# Patient Record
Sex: Female | Born: 1945
Health system: Southern US, Community
[De-identification: ages and names within clinical notes are randomized; demographics above are authoritative.]

## PROBLEM LIST (undated history)

## (undated) DIAGNOSIS — N879 Dysplasia of cervix uteri, unspecified: Secondary | ICD-10-CM

## (undated) DIAGNOSIS — C801 Malignant (primary) neoplasm, unspecified: Secondary | ICD-10-CM

## (undated) DIAGNOSIS — F17200 Nicotine dependence, unspecified, uncomplicated: Secondary | ICD-10-CM

## (undated) DIAGNOSIS — E669 Obesity, unspecified: Secondary | ICD-10-CM

## (undated) DIAGNOSIS — E785 Hyperlipidemia, unspecified: Secondary | ICD-10-CM

## (undated) DIAGNOSIS — I2510 Atherosclerotic heart disease of native coronary artery without angina pectoris: Secondary | ICD-10-CM

## (undated) DIAGNOSIS — H269 Unspecified cataract: Secondary | ICD-10-CM

## (undated) DIAGNOSIS — J449 Chronic obstructive pulmonary disease, unspecified: Secondary | ICD-10-CM

## (undated) DIAGNOSIS — I219 Acute myocardial infarction, unspecified: Secondary | ICD-10-CM

## (undated) DIAGNOSIS — N893 Dysplasia of vagina, unspecified: Secondary | ICD-10-CM

## (undated) DIAGNOSIS — R0600 Dyspnea, unspecified: Secondary | ICD-10-CM

## (undated) DIAGNOSIS — I10 Essential (primary) hypertension: Secondary | ICD-10-CM

## (undated) HISTORY — PX: ABDOMINAL HYSTERECTOMY: SHX81

## (undated) HISTORY — PX: CATARACT EXTRACTION: SUR2

## (undated) HISTORY — DX: Hyperlipidemia, unspecified: E78.5

## (undated) HISTORY — DX: Essential (primary) hypertension: I10

## (undated) HISTORY — PX: CARDIAC CATHETERIZATION: SHX172

## (undated) HISTORY — DX: Atherosclerotic heart disease of native coronary artery without angina pectoris: I25.10

## (undated) HISTORY — DX: Unspecified cataract: H26.9

## (undated) HISTORY — DX: Nicotine dependence, unspecified, uncomplicated: F17.200

## (undated) HISTORY — DX: Obesity, unspecified: E66.9

## (undated) HISTORY — PX: OOPHORECTOMY: SHX86

## (undated) HISTORY — DX: Dysplasia of cervix uteri, unspecified: N87.9

## (undated) HISTORY — PX: OTHER SURGICAL HISTORY: SHX169

## (undated) HISTORY — DX: Dysplasia of vagina, unspecified: N89.3

## (undated) HISTORY — PX: COLPOSCOPY: SHX161

## (undated) HISTORY — DX: Acute myocardial infarction, unspecified: I21.9

---

## 1997-08-14 ENCOUNTER — Emergency Department (HOSPITAL_COMMUNITY): Admission: EM | Admit: 1997-08-14 | Discharge: 1997-08-14 | Payer: Self-pay | Admitting: Emergency Medicine

## 1997-12-30 ENCOUNTER — Other Ambulatory Visit: Admission: RE | Admit: 1997-12-30 | Discharge: 1997-12-30 | Payer: Self-pay | Admitting: Obstetrics and Gynecology

## 1999-01-10 ENCOUNTER — Encounter: Admission: RE | Admit: 1999-01-10 | Discharge: 1999-01-10 | Payer: Self-pay | Admitting: Family Medicine

## 1999-01-10 ENCOUNTER — Encounter: Payer: Self-pay | Admitting: Family Medicine

## 1999-06-24 ENCOUNTER — Other Ambulatory Visit: Admission: RE | Admit: 1999-06-24 | Discharge: 1999-06-24 | Payer: Self-pay | Admitting: Obstetrics and Gynecology

## 1999-07-25 ENCOUNTER — Other Ambulatory Visit: Admission: RE | Admit: 1999-07-25 | Discharge: 1999-07-25 | Payer: Self-pay | Admitting: Obstetrics and Gynecology

## 1999-10-14 ENCOUNTER — Encounter: Admission: RE | Admit: 1999-10-14 | Discharge: 1999-10-14 | Payer: Self-pay | Admitting: Family Medicine

## 1999-10-14 ENCOUNTER — Encounter: Payer: Self-pay | Admitting: Family Medicine

## 1999-10-28 ENCOUNTER — Other Ambulatory Visit: Admission: RE | Admit: 1999-10-28 | Discharge: 1999-10-28 | Payer: Self-pay | Admitting: Obstetrics and Gynecology

## 2000-10-19 ENCOUNTER — Encounter: Payer: Self-pay | Admitting: Family Medicine

## 2000-10-19 ENCOUNTER — Encounter: Admission: RE | Admit: 2000-10-19 | Discharge: 2000-10-19 | Payer: Self-pay | Admitting: Family Medicine

## 2000-11-09 ENCOUNTER — Encounter: Payer: Self-pay | Admitting: Family Medicine

## 2000-11-09 ENCOUNTER — Encounter: Admission: RE | Admit: 2000-11-09 | Discharge: 2000-11-09 | Payer: Self-pay | Admitting: Family Medicine

## 2001-01-09 ENCOUNTER — Encounter: Admission: RE | Admit: 2001-01-09 | Discharge: 2001-01-09 | Payer: Self-pay | Admitting: Gastroenterology

## 2001-01-09 ENCOUNTER — Encounter: Payer: Self-pay | Admitting: Gastroenterology

## 2001-01-21 ENCOUNTER — Other Ambulatory Visit: Admission: RE | Admit: 2001-01-21 | Discharge: 2001-01-21 | Payer: Self-pay | Admitting: Obstetrics and Gynecology

## 2001-08-08 ENCOUNTER — Other Ambulatory Visit: Admission: RE | Admit: 2001-08-08 | Discharge: 2001-08-08 | Payer: Self-pay | Admitting: Obstetrics and Gynecology

## 2001-10-24 ENCOUNTER — Encounter: Admission: RE | Admit: 2001-10-24 | Discharge: 2001-10-24 | Payer: Self-pay | Admitting: Family Medicine

## 2001-10-24 ENCOUNTER — Encounter: Payer: Self-pay | Admitting: Family Medicine

## 2002-08-11 ENCOUNTER — Other Ambulatory Visit: Admission: RE | Admit: 2002-08-11 | Discharge: 2002-08-11 | Payer: Self-pay | Admitting: Obstetrics and Gynecology

## 2002-10-31 ENCOUNTER — Encounter: Payer: Self-pay | Admitting: Family Medicine

## 2002-10-31 ENCOUNTER — Encounter: Admission: RE | Admit: 2002-10-31 | Discharge: 2002-10-31 | Payer: Self-pay | Admitting: Family Medicine

## 2003-01-30 ENCOUNTER — Other Ambulatory Visit: Admission: RE | Admit: 2003-01-30 | Discharge: 2003-01-30 | Payer: Self-pay | Admitting: Obstetrics and Gynecology

## 2003-08-13 ENCOUNTER — Other Ambulatory Visit: Admission: RE | Admit: 2003-08-13 | Discharge: 2003-08-13 | Payer: Self-pay | Admitting: Obstetrics and Gynecology

## 2003-11-02 ENCOUNTER — Encounter: Admission: RE | Admit: 2003-11-02 | Discharge: 2003-11-02 | Payer: Self-pay | Admitting: Family Medicine

## 2004-01-14 ENCOUNTER — Other Ambulatory Visit: Admission: RE | Admit: 2004-01-14 | Discharge: 2004-01-14 | Payer: Self-pay | Admitting: Obstetrics and Gynecology

## 2004-08-19 ENCOUNTER — Other Ambulatory Visit: Admission: RE | Admit: 2004-08-19 | Discharge: 2004-08-19 | Payer: Self-pay | Admitting: Obstetrics and Gynecology

## 2004-12-06 ENCOUNTER — Encounter: Admission: RE | Admit: 2004-12-06 | Discharge: 2004-12-06 | Payer: Self-pay | Admitting: Family Medicine

## 2005-01-31 ENCOUNTER — Other Ambulatory Visit: Admission: RE | Admit: 2005-01-31 | Discharge: 2005-01-31 | Payer: Self-pay | Admitting: Obstetrics and Gynecology

## 2005-08-21 ENCOUNTER — Other Ambulatory Visit: Admission: RE | Admit: 2005-08-21 | Discharge: 2005-08-21 | Payer: Self-pay | Admitting: Obstetrics and Gynecology

## 2005-12-07 ENCOUNTER — Encounter: Admission: RE | Admit: 2005-12-07 | Discharge: 2005-12-07 | Payer: Self-pay | Admitting: Family Medicine

## 2006-02-20 ENCOUNTER — Other Ambulatory Visit: Admission: RE | Admit: 2006-02-20 | Discharge: 2006-02-20 | Payer: Self-pay | Admitting: Obstetrics and Gynecology

## 2006-05-28 ENCOUNTER — Encounter: Admission: RE | Admit: 2006-05-28 | Discharge: 2006-05-28 | Payer: Self-pay | Admitting: Family Medicine

## 2006-08-24 ENCOUNTER — Other Ambulatory Visit: Admission: RE | Admit: 2006-08-24 | Discharge: 2006-08-24 | Payer: Self-pay | Admitting: Obstetrics and Gynecology

## 2006-12-11 ENCOUNTER — Encounter: Admission: RE | Admit: 2006-12-11 | Discharge: 2006-12-11 | Payer: Self-pay | Admitting: Family Medicine

## 2007-04-12 ENCOUNTER — Encounter: Admission: RE | Admit: 2007-04-12 | Discharge: 2007-04-12 | Payer: Self-pay | Admitting: Family Medicine

## 2007-12-12 ENCOUNTER — Encounter: Admission: RE | Admit: 2007-12-12 | Discharge: 2007-12-12 | Payer: Self-pay | Admitting: Family Medicine

## 2008-12-14 ENCOUNTER — Encounter: Admission: RE | Admit: 2008-12-14 | Discharge: 2008-12-14 | Payer: Self-pay | Admitting: Family Medicine

## 2009-02-13 DIAGNOSIS — N893 Dysplasia of vagina, unspecified: Secondary | ICD-10-CM

## 2009-02-13 HISTORY — DX: Dysplasia of vagina, unspecified: N89.3

## 2009-12-09 ENCOUNTER — Encounter: Admission: RE | Admit: 2009-12-09 | Discharge: 2009-12-09 | Payer: Self-pay | Admitting: Family Medicine

## 2009-12-27 ENCOUNTER — Encounter: Admission: RE | Admit: 2009-12-27 | Discharge: 2009-12-27 | Payer: Self-pay | Admitting: Family Medicine

## 2010-01-13 DIAGNOSIS — I219 Acute myocardial infarction, unspecified: Secondary | ICD-10-CM

## 2010-01-13 HISTORY — DX: Acute myocardial infarction, unspecified: I21.9

## 2010-02-04 ENCOUNTER — Emergency Department (HOSPITAL_COMMUNITY)
Admission: EM | Admit: 2010-02-04 | Discharge: 2010-02-04 | Payer: Self-pay | Source: Home / Self Care | Attending: Internal Medicine | Admitting: Internal Medicine

## 2010-02-04 ENCOUNTER — Inpatient Hospital Stay (HOSPITAL_COMMUNITY)
Admission: AD | Admit: 2010-02-04 | Discharge: 2010-02-07 | Payer: Self-pay | Source: Home / Self Care | Attending: Cardiology | Admitting: Cardiology

## 2010-02-21 ENCOUNTER — Ambulatory Visit: Payer: Self-pay | Admitting: Cardiology

## 2010-03-03 ENCOUNTER — Encounter (HOSPITAL_COMMUNITY)
Admission: RE | Admit: 2010-03-03 | Discharge: 2010-03-15 | Payer: Self-pay | Source: Home / Self Care | Attending: Cardiology | Admitting: Cardiology

## 2010-03-05 ENCOUNTER — Encounter: Payer: Self-pay | Admitting: Gastroenterology

## 2010-03-16 ENCOUNTER — Encounter (HOSPITAL_COMMUNITY): Payer: Medicare Other | Attending: Cardiology

## 2010-03-16 DIAGNOSIS — Z882 Allergy status to sulfonamides status: Secondary | ICD-10-CM | POA: Insufficient documentation

## 2010-03-16 DIAGNOSIS — I2510 Atherosclerotic heart disease of native coronary artery without angina pectoris: Secondary | ICD-10-CM | POA: Insufficient documentation

## 2010-03-16 DIAGNOSIS — Z8249 Family history of ischemic heart disease and other diseases of the circulatory system: Secondary | ICD-10-CM | POA: Insufficient documentation

## 2010-03-16 DIAGNOSIS — F17200 Nicotine dependence, unspecified, uncomplicated: Secondary | ICD-10-CM | POA: Insufficient documentation

## 2010-03-16 DIAGNOSIS — I2119 ST elevation (STEMI) myocardial infarction involving other coronary artery of inferior wall: Secondary | ICD-10-CM | POA: Insufficient documentation

## 2010-03-16 DIAGNOSIS — I2582 Chronic total occlusion of coronary artery: Secondary | ICD-10-CM | POA: Insufficient documentation

## 2010-03-16 DIAGNOSIS — E669 Obesity, unspecified: Secondary | ICD-10-CM | POA: Insufficient documentation

## 2010-03-16 DIAGNOSIS — E785 Hyperlipidemia, unspecified: Secondary | ICD-10-CM | POA: Insufficient documentation

## 2010-03-16 DIAGNOSIS — Z7982 Long term (current) use of aspirin: Secondary | ICD-10-CM | POA: Insufficient documentation

## 2010-03-16 DIAGNOSIS — Z5189 Encounter for other specified aftercare: Secondary | ICD-10-CM | POA: Insufficient documentation

## 2010-03-16 DIAGNOSIS — Z7902 Long term (current) use of antithrombotics/antiplatelets: Secondary | ICD-10-CM | POA: Insufficient documentation

## 2010-03-16 DIAGNOSIS — Z9861 Coronary angioplasty status: Secondary | ICD-10-CM | POA: Insufficient documentation

## 2010-03-18 ENCOUNTER — Encounter (HOSPITAL_COMMUNITY): Payer: Medicare Other

## 2010-03-21 ENCOUNTER — Encounter (HOSPITAL_COMMUNITY): Payer: Medicare Other

## 2010-03-23 ENCOUNTER — Encounter (HOSPITAL_COMMUNITY): Payer: Medicare Other

## 2010-03-25 ENCOUNTER — Encounter (HOSPITAL_COMMUNITY): Payer: Medicare Other

## 2010-03-28 ENCOUNTER — Encounter (HOSPITAL_COMMUNITY): Payer: Medicare Other

## 2010-03-30 ENCOUNTER — Encounter (HOSPITAL_COMMUNITY): Payer: Medicare Other

## 2010-04-01 ENCOUNTER — Encounter (HOSPITAL_COMMUNITY): Payer: Medicare Other

## 2010-04-04 ENCOUNTER — Encounter (HOSPITAL_COMMUNITY): Payer: Medicare Other

## 2010-04-06 ENCOUNTER — Encounter (HOSPITAL_COMMUNITY): Payer: Medicare Other

## 2010-04-08 ENCOUNTER — Encounter (HOSPITAL_COMMUNITY): Payer: Medicare Other

## 2010-04-11 ENCOUNTER — Encounter (HOSPITAL_COMMUNITY): Payer: Medicare Other

## 2010-04-13 ENCOUNTER — Encounter (HOSPITAL_COMMUNITY): Payer: Medicare Other

## 2010-04-15 ENCOUNTER — Encounter (HOSPITAL_COMMUNITY): Payer: Medicare Other | Attending: Cardiology

## 2010-04-15 DIAGNOSIS — E669 Obesity, unspecified: Secondary | ICD-10-CM | POA: Insufficient documentation

## 2010-04-15 DIAGNOSIS — I2582 Chronic total occlusion of coronary artery: Secondary | ICD-10-CM | POA: Insufficient documentation

## 2010-04-15 DIAGNOSIS — Z882 Allergy status to sulfonamides status: Secondary | ICD-10-CM | POA: Insufficient documentation

## 2010-04-15 DIAGNOSIS — Z9861 Coronary angioplasty status: Secondary | ICD-10-CM | POA: Insufficient documentation

## 2010-04-15 DIAGNOSIS — I2510 Atherosclerotic heart disease of native coronary artery without angina pectoris: Secondary | ICD-10-CM | POA: Insufficient documentation

## 2010-04-15 DIAGNOSIS — E785 Hyperlipidemia, unspecified: Secondary | ICD-10-CM | POA: Insufficient documentation

## 2010-04-15 DIAGNOSIS — Z7982 Long term (current) use of aspirin: Secondary | ICD-10-CM | POA: Insufficient documentation

## 2010-04-15 DIAGNOSIS — Z8249 Family history of ischemic heart disease and other diseases of the circulatory system: Secondary | ICD-10-CM | POA: Insufficient documentation

## 2010-04-15 DIAGNOSIS — I2119 ST elevation (STEMI) myocardial infarction involving other coronary artery of inferior wall: Secondary | ICD-10-CM | POA: Insufficient documentation

## 2010-04-15 DIAGNOSIS — Z5189 Encounter for other specified aftercare: Secondary | ICD-10-CM | POA: Insufficient documentation

## 2010-04-15 DIAGNOSIS — Z7902 Long term (current) use of antithrombotics/antiplatelets: Secondary | ICD-10-CM | POA: Insufficient documentation

## 2010-04-15 DIAGNOSIS — F17200 Nicotine dependence, unspecified, uncomplicated: Secondary | ICD-10-CM | POA: Insufficient documentation

## 2010-04-18 ENCOUNTER — Encounter (HOSPITAL_COMMUNITY): Payer: Medicare Other

## 2010-04-20 ENCOUNTER — Encounter (HOSPITAL_COMMUNITY): Payer: Medicare Other

## 2010-04-22 ENCOUNTER — Encounter (HOSPITAL_COMMUNITY): Payer: Medicare Other

## 2010-04-22 ENCOUNTER — Other Ambulatory Visit (INDEPENDENT_AMBULATORY_CARE_PROVIDER_SITE_OTHER): Payer: Medicare Other

## 2010-04-22 ENCOUNTER — Other Ambulatory Visit: Payer: Self-pay | Admitting: Cardiology

## 2010-04-22 DIAGNOSIS — E789 Disorder of lipoprotein metabolism, unspecified: Secondary | ICD-10-CM

## 2010-04-22 LAB — LIPID PANEL
Cholesterol: 196 mg/dL (ref 0–200)
HDL: 51 mg/dL (ref >39)
Total CHOL/HDL Ratio: 3.8 Ratio
VLDL: 33 mg/dL (ref 0–40)

## 2010-04-22 LAB — COMPREHENSIVE METABOLIC PANEL
ALT: 19 U/L (ref 0–35)
AST: 23 U/L (ref 0–37)
BUN: 16 mg/dL (ref 6–23)
Calcium: 9.7 mg/dL (ref 8.4–10.5)
Creat: 0.83 mg/dL (ref 0.40–1.20)
Total Bilirubin: 0.5 mg/dL (ref 0.3–1.2)

## 2010-04-25 ENCOUNTER — Encounter (HOSPITAL_COMMUNITY): Payer: Medicare Other

## 2010-04-25 LAB — HEPATIC FUNCTION PANEL
Albumin: 3.5 g/dL (ref 3.5–5.2)
Indirect Bilirubin: 0.4 mg/dL (ref 0.3–0.9)
Total Protein: 7 g/dL (ref 6.0–8.3)

## 2010-04-25 LAB — COMPREHENSIVE METABOLIC PANEL
Albumin: 3.5 g/dL (ref 3.5–5.2)
Alkaline Phosphatase: 68 U/L (ref 39–117)
BUN: 15 mg/dL (ref 6–23)
CO2: 25 mEq/L (ref 19–32)
Chloride: 106 mEq/L (ref 96–112)
GFR calc non Af Amer: >60 mL/min (ref >60)
Potassium: 4.3 mEq/L (ref 3.5–5.1)
Total Bilirubin: 0.5 mg/dL (ref 0.3–1.2)

## 2010-04-25 LAB — CARDIAC PANEL(CRET KIN+CKTOT+MB+TROPI)
CK, MB: 153.5 ng/mL (ref 0.3–4.0)
Relative Index: 17.6 — ABNORMAL HIGH (ref 0.0–2.5)
Relative Index: 21.7 — ABNORMAL HIGH (ref 0.0–2.5)
Total CK: 872 U/L — ABNORMAL HIGH (ref 7–177)
Troponin I: 18.58 ng/mL (ref 0.00–0.06)
Troponin I: 25.12 ng/mL (ref 0.00–0.06)

## 2010-04-25 LAB — CBC
HCT: 46.3 % — ABNORMAL HIGH (ref 36.0–46.0)
HCT: 46.8 % — ABNORMAL HIGH (ref 36.0–46.0)
MCH: 30 pg (ref 26.0–34.0)
MCH: 30.6 pg (ref 26.0–34.0)
MCHC: 32.5 g/dL (ref 30.0–36.0)
MCHC: 33.3 g/dL (ref 30.0–36.0)
MCV: 90.9 fL (ref 78.0–100.0)
MCV: 92.5 fL (ref 78.0–100.0)
Platelets: 210 10*3/uL (ref 150–400)
Platelets: 214 10*3/uL (ref 150–400)
Platelets: 242 10*3/uL (ref 150–400)
RBC: 5.52 MIL/uL — ABNORMAL HIGH (ref 3.87–5.11)
RDW: 13.9 % (ref 11.5–15.5)
RDW: 14.3 % (ref 11.5–15.5)
RDW: 14.6 % (ref 11.5–15.5)
WBC: 13.4 10*3/uL — ABNORMAL HIGH (ref 4.0–10.5)
WBC: 14.5 10*3/uL — ABNORMAL HIGH (ref 4.0–10.5)
WBC: 18.3 10*3/uL — ABNORMAL HIGH (ref 4.0–10.5)

## 2010-04-25 LAB — TSH: TSH: 4.284 u[IU]/mL (ref 0.350–4.500)

## 2010-04-25 LAB — HEMOGLOBIN A1C: Mean Plasma Glucose: 103 mg/dL (ref <117)

## 2010-04-25 LAB — DIFFERENTIAL
Basophils Absolute: 0.1 10*3/uL (ref 0.0–0.1)
Basophils Absolute: 0.1 10*3/uL (ref 0.0–0.1)
Basophils Relative: 0 % (ref 0–1)
Eosinophils Absolute: 0.2 10*3/uL (ref 0.0–0.7)
Eosinophils Relative: 1 % (ref 0–5)
Lymphocytes Relative: 25 % (ref 12–46)
Lymphs Abs: 3.7 10*3/uL (ref 0.7–4.0)
Monocytes Absolute: 1.1 10*3/uL — ABNORMAL HIGH (ref 0.1–1.0)
Neutro Abs: 8.8 10*3/uL — ABNORMAL HIGH (ref 1.7–7.7)
Neutrophils Relative %: 67 % (ref 43–77)

## 2010-04-25 LAB — BASIC METABOLIC PANEL
BUN: 15 mg/dL (ref 6–23)
BUN: 16 mg/dL (ref 6–23)
Calcium: 9 mg/dL (ref 8.4–10.5)
Chloride: 104 mEq/L (ref 96–112)
Creatinine, Ser: 0.7 mg/dL (ref 0.4–1.2)
Creatinine, Ser: 0.82 mg/dL (ref 0.4–1.2)
GFR calc Af Amer: >60 mL/min (ref >60)
GFR calc non Af Amer: >60 mL/min (ref >60)
Glucose, Bld: 112 mg/dL — ABNORMAL HIGH (ref 70–99)
Potassium: 4.6 mEq/L (ref 3.5–5.1)

## 2010-04-25 LAB — POCT CARDIAC MARKERS
CKMB, poc: <1 ng/mL — ABNORMAL LOW (ref 1.0–8.0)
Myoglobin, poc: 26.8 ng/mL (ref 12–200)
Troponin i, poc: <0.05 ng/mL (ref 0.00–0.09)

## 2010-04-25 LAB — CK TOTAL AND CKMB (NOT AT ARMC): CK, MB: 9.3 ng/mL (ref 0.3–4.0)

## 2010-04-25 LAB — LIPID PANEL: VLDL: 18 mg/dL (ref 0–40)

## 2010-04-25 LAB — LIPASE, BLOOD: Lipase: 23 U/L (ref 11–59)

## 2010-04-25 LAB — D-DIMER, QUANTITATIVE: D-Dimer, Quant: 1.04 ug/mL-FEU — ABNORMAL HIGH (ref 0.00–0.48)

## 2010-04-25 LAB — MRSA PCR SCREENING: MRSA by PCR: NEGATIVE

## 2010-04-25 LAB — APTT: aPTT: 30 seconds (ref 24–37)

## 2010-04-26 ENCOUNTER — Ambulatory Visit (INDEPENDENT_AMBULATORY_CARE_PROVIDER_SITE_OTHER): Payer: Medicare Other | Admitting: Cardiology

## 2010-04-26 DIAGNOSIS — I2129 ST elevation (STEMI) myocardial infarction involving other sites: Secondary | ICD-10-CM

## 2010-04-27 ENCOUNTER — Encounter (HOSPITAL_COMMUNITY): Payer: Medicare Other

## 2010-04-29 ENCOUNTER — Encounter (HOSPITAL_COMMUNITY): Payer: Medicare Other

## 2010-05-02 ENCOUNTER — Encounter (HOSPITAL_COMMUNITY): Payer: Medicare Other

## 2010-05-04 ENCOUNTER — Encounter (HOSPITAL_COMMUNITY): Payer: Medicare Other

## 2010-05-04 ENCOUNTER — Telehealth: Payer: Self-pay | Admitting: *Deleted

## 2010-05-04 NOTE — Telephone Encounter (Signed)
Called stating she took niaspan on sat nite and used the ASA and low fat snack but when she got up on Sunday was very nauseated. Still nauseated today but is better.  Per dr. Swaziland d/c niaspan.  Will try zetia 10 mg daily.  Will give her samples. ah

## 2010-05-06 ENCOUNTER — Encounter (HOSPITAL_COMMUNITY): Payer: Medicare Other

## 2010-05-09 ENCOUNTER — Encounter (HOSPITAL_COMMUNITY): Payer: Medicare Other

## 2010-05-11 ENCOUNTER — Encounter (HOSPITAL_COMMUNITY): Payer: Medicare Other

## 2010-05-13 ENCOUNTER — Encounter (HOSPITAL_COMMUNITY): Payer: Medicare Other

## 2010-05-16 ENCOUNTER — Telehealth: Payer: Self-pay | Admitting: Cardiology

## 2010-05-16 ENCOUNTER — Telehealth: Payer: Self-pay | Admitting: *Deleted

## 2010-05-16 ENCOUNTER — Encounter (HOSPITAL_COMMUNITY): Payer: Medicare Other

## 2010-05-16 NOTE — Telephone Encounter (Signed)
PT WAS GIVEN AN ANTIBIOTIC AND JUST WANTS TO MAKE SURE ITS OK TO TAKE.

## 2010-05-16 NOTE — Telephone Encounter (Signed)
Called back to see if OK to take Prilosec for stomach problems.  Ok to take

## 2010-05-16 NOTE — Telephone Encounter (Signed)
Pt just spoke with you, forgot to ask you a question

## 2010-05-16 NOTE — Telephone Encounter (Signed)
Dentist put her on antibiotic and wanted to make sure was OK for her to take w/ other meds. Advised was fine.

## 2010-05-17 NOTE — H&P (Signed)
NAMESHAMELL, HITTLE               ACCOUNT NO.:  [id number]  MEDICAL RECORD NO.:  [id number]          PATIENT TYPE:  OBV  LOCATION:  0104                         FACILITY:  [facility]  PHYSICIAN:  Thad Ranger, MD       DATE OF BIRTH:  March 25, 1945  DATE OF ADMISSION:  02/04/2010 DATE OF DISCHARGE:                             HISTORY & PHYSICAL   PRIMARY CARE PHYSICIAN:  Gretta Arab. Valentina Lucks, MD, the patient recently switched from Dr. Ursula Beath.  CARDIOLOGY:  The patient is requesting [facility], Dr. Olga Millers.  CHIEF COMPLAINT:  Chest pain.  HISTORY OF PRESENT ILLNESS:  Ms. Norment is a 65 year old female with past medical history of hyperlipidemia and tobacco abuse who presents to [facility] with complaints of chest pain. The patient describes substernal chest pain, burning in nature intermittently throughout the week.  The patient states the pain lasts approximately 15 minutes and is relieved without any intervention.  The patient is unable to associate pain with any exertion.  Pain last night was relieved with 2 peppermint patties However, when the patient developed subsequent diaphoresis, she decided to come to the emergency department.  The patient has received GI cocktail and sublingual nitroglycerin while in the emergency department without any relief of 7/10 pain.  Of note, the patient does have 2 medical record numbers.  She does not have any old EKGs under either medical record number.  However, EKG obtained in the emergency department showing lateral and anterior ischemia.  Therefore, the patient is to be admitted at this time by [facility] hospitalist for further evaluation and treatment.  The patient denies any recent fever, chills, abdominal pain, nausea, vomiting, diarrhea, or lower extremity swelling.  The patient reports cough, chronic in nature and unchanged.  PAST MEDICAL HISTORY: 1. Hyperlipidemia. 2. Remote  hysterectomy. 3. Current tobacco abuse.  MEDICATIONS: 1. Provera 5 mg p.o. daily, taken 16 through to the 25th of each     month. 2. Vitamin D 500 mg p.o. daily. 3. Vitamin C 500 mg p.o. q.a.m. 4. Vitamin B over the counter 1 tablet p.o. daily. 5. Premarin 0.625 mg p.o. daily, taken 1st through the 25th of each     month. 6. Fish oil 500 mg p.o. q.a.m. 7. Calcium carbonate 500 mg p.o. daily. 8. Magnesium over the counter 1 tablet p.o. daily.  ALLERGIES:  SULFA.  FAMILY HISTORY:  Mother deceased at age 17 with CHF and stomach cancer. Father deceased at age 6 with type 2 diabetes and coronary artery disease.  The patient has a brother with atrial fibrillation and a sister with colon cancer.  SOCIAL HISTORY:  The patient lives alone.  She reports smoking approximately 3 quarters of a pack of cigarettes per day for 40 plus years.  She denies any EtOH.  REVIEW OF SYSTEMS:  As stated in HPI, otherwise negative.  PHYSICAL EXAMINATION:  VITAL SIGNS:  Blood pressure 102/61, heart rate 69, respirations 20, temperature 97.5, O2 saturation is 93% on 2 L. GENERAL: This is a Caucasian female awake and alert, in no acute distress. HEENT:  Head is normocephalic, atraumatic.  Eyes,  extraocular movements are intact without scleral icterus or injection.  Ear, nose, throat, mucous membranes are moist with no oropharyngeal lesions. NECK:  Supple with no thyromegaly or lymphadenopathy.  No JVD or carotid bruits. CHEST:  With symmetrical movement, nontender to palpation. CARDIOVASCULAR:  S1 and S2, regular rate and rhythm.  No murmur, rub, or gallop.  No lower extremity edema.  RESPIRATORY:  Patient with scattered crackles.  No wheezes or rales.  No increased work of breathing. GI:  Abdomen is obese, soft, nontender, nondistended with positive bowel sounds.  No appreciated masses or hepatosplenomegaly.  Negative Murphy sign. NEUROLOGIC:  The patient is able move all extremities x4 without  motor sensory deficit on exam. PSYCHOLOGIC:  The patient is alert and oriented x4 with normal mood and affect.  PERTINENT LABORATORY DATA AND ANCILLARY STUDIES:  White cell count 14.5 with an absolute neutrophilic count of 9.7, platelet count 242, hemoglobin 16.9, hematocrit 50.2.  Sodium 138, potassium 3.6, chloride 108, CO2 of 20, BUN 16, creatinine 0.70, serum glucose 203.  Cardiac point-of-care negative x1.  D-dimer 1.04.  EKG shows sinus rhythm.  The patient has T-wave inversion in anterior and lateral leads with prominent Q waves and lateral leads.  No old EKG is available for comparison.  CT angio of the chest is negative for pulmonary emboli, it does show moderate coronary artery calcification, no acute findings.  ASSESSMENT AND PLAN: 1. Chest pain.  The patient's symptoms are very atypical for acute     coronary syndrome, however, given the patient's EKG findings and     with risk factors for coronary artery disease, we will admit the     patient for further evaluation.  [facility] has been asked     to see the patient given EKG findings.  Will start aspirin, beta-     blocker, and statin therapy.  The patient's pain at time of     admission is 7/10, unrelieved with GI cocktail and morphine.  We     will start nitroglycerin drip and titrate for pain to determine     efficacy.  Appreciate [facility] for assistance. 2. Leukocytosis.  The patient reports chronic leukocytosis per primary     care physician of unclear etiology.  The patient denies any recent     fever, chills.  Her cough is chronic in nature and unchanged.     Chest x-ray is negative for any infiltrate.  Will check urinalysis.     Will order for nebulizer treatment as the patient does appear to     have some chronic obstructive pulmonary disease and congestion at     time of exam.  Will hold any empiric antibiotics at this time as     the patient is afebrile and nontoxic appearing.  Will recheck  white     blood cell count in the morning to determine trend in need for     antibiotic therapy. 3. Hyperglycemia.  Will check hemoglobin A1c and monitor the patient     without any history of diabetes. 4. Hyperlipidemia.  Will check fasting lipid profile in the morning.     Will order low-dose statin medication given EKG findings. 5. Prophylaxis.  Will order for PPI therapy and SCDs.     Cordelia Pen, NP   ______________________________ Thad Ranger, MD    LE/MEDQ  D:  02/04/2010  T:  02/04/2010  Job:  [id number]  cc:   Gretta Arab. Valentina Lucks, M.D. Fax: [phone]  Madolyn Frieze. Crenshaw,  MD, [facility] [address]  [address] [place] [address]  Electronically Signed by Cordelia Pen NP on 02/10/2010 11:29:52 AM Electronically Signed by Andres Labrum RAI  on 02/15/2010 05:16:14 PM

## 2010-05-18 ENCOUNTER — Encounter (HOSPITAL_COMMUNITY): Payer: Medicare Other

## 2010-05-20 ENCOUNTER — Encounter (HOSPITAL_COMMUNITY): Payer: Medicare Other

## 2010-05-23 ENCOUNTER — Encounter (HOSPITAL_COMMUNITY): Payer: Medicare Other

## 2010-05-25 ENCOUNTER — Encounter (HOSPITAL_COMMUNITY): Payer: Medicare Other

## 2010-05-27 ENCOUNTER — Encounter (HOSPITAL_COMMUNITY): Payer: Medicare Other

## 2010-05-27 ENCOUNTER — Telehealth: Payer: Self-pay | Admitting: Cardiology

## 2010-05-27 NOTE — Telephone Encounter (Signed)
Wants to stop her Lisinopril, Metoprolol medications because her medicines are causing her to have an upset stomach, sploches on her face and other symptyoms. Please call. I have pulled the chart.

## 2010-05-27 NOTE — Telephone Encounter (Signed)
Called wanting to stop Metoprolol and Lisinopril due to stomach being upset,splotches on face, and ?vision blurry. Advised not to stop any medication. Will speak w/Dr.Jordan on Monday and will call her back. Has pepcid but hasn't not started taking yet. Advised to try taking and see if helps. Will call her back Monday.

## 2010-05-27 NOTE — Telephone Encounter (Signed)
WANTED TO MAKE SURE THAT YOU UNDERSTOOD THAT SHE IS ONLY  STOPPING MEDS FOR ONLY 2 WEEKS (METOPRALOL ALISINPROLO)

## 2010-05-30 ENCOUNTER — Encounter (HOSPITAL_COMMUNITY): Payer: Medicare Other

## 2010-05-30 NOTE — Telephone Encounter (Signed)
LM. Had called on Friday wanting to stop Metoprolol and Lisinopril due to upset stomach,vision blurry,and splotches on face.  Per Dr. Sw[place]and does not want her to stop either medication.

## 2010-06-01 ENCOUNTER — Encounter (HOSPITAL_COMMUNITY): Payer: Medicare Other

## 2010-06-03 ENCOUNTER — Encounter (HOSPITAL_COMMUNITY): Payer: Medicare Other

## 2010-06-06 ENCOUNTER — Encounter (HOSPITAL_COMMUNITY): Payer: Medicare Other

## 2010-06-07 ENCOUNTER — Telehealth: Payer: Self-pay | Admitting: Cardiology

## 2010-06-07 NOTE — Telephone Encounter (Signed)
Dr. Maurice Small has prescribed levothyroxin 25 mcg.  Pt wanted Dr.Jordan to know that she will be taking this.

## 2010-06-08 ENCOUNTER — Ambulatory Visit (HOSPITAL_COMMUNITY): Payer: Medicare Other

## 2010-06-10 ENCOUNTER — Ambulatory Visit (HOSPITAL_COMMUNITY): Payer: Medicare Other

## 2010-07-14 ENCOUNTER — Telehealth: Payer: Self-pay | Admitting: Cardiology

## 2010-07-14 NOTE — Telephone Encounter (Signed)
Called wanting to know if any of meds could cause her to have blood in stool. Has had a virus over the past week and has noticed sl blood in stool. Not bright red. Feels some better now. Advised that none of her meds should cause bleeding. Advised if continues to see blood in stool needs to call PCP and get a stool specimen.

## 2010-07-14 NOTE — Telephone Encounter (Signed)
Patient wants to know if she is taking any medications that would cause blood in stool.

## 2010-08-19 ENCOUNTER — Other Ambulatory Visit: Payer: Self-pay | Admitting: *Deleted

## 2010-08-19 ENCOUNTER — Encounter: Payer: Self-pay | Admitting: *Deleted

## 2010-08-19 DIAGNOSIS — F17200 Nicotine dependence, unspecified, uncomplicated: Secondary | ICD-10-CM | POA: Insufficient documentation

## 2010-08-19 DIAGNOSIS — I219 Acute myocardial infarction, unspecified: Secondary | ICD-10-CM | POA: Insufficient documentation

## 2010-08-19 DIAGNOSIS — E785 Hyperlipidemia, unspecified: Secondary | ICD-10-CM

## 2010-08-25 ENCOUNTER — Encounter: Payer: Self-pay | Admitting: Cardiology

## 2010-08-26 ENCOUNTER — Other Ambulatory Visit (INDEPENDENT_AMBULATORY_CARE_PROVIDER_SITE_OTHER): Payer: Medicare Other | Admitting: *Deleted

## 2010-08-26 ENCOUNTER — Ambulatory Visit (INDEPENDENT_AMBULATORY_CARE_PROVIDER_SITE_OTHER): Payer: Medicare Other | Admitting: Cardiology

## 2010-08-26 ENCOUNTER — Encounter: Payer: Self-pay | Admitting: Cardiology

## 2010-08-26 DIAGNOSIS — E785 Hyperlipidemia, unspecified: Secondary | ICD-10-CM

## 2010-08-26 DIAGNOSIS — I2510 Atherosclerotic heart disease of native coronary artery without angina pectoris: Secondary | ICD-10-CM | POA: Insufficient documentation

## 2010-08-26 DIAGNOSIS — E669 Obesity, unspecified: Secondary | ICD-10-CM | POA: Insufficient documentation

## 2010-08-26 LAB — LIPID PANEL
Cholesterol: 190 mg/dL (ref 0–200)
HDL: 52.5 mg/dL (ref >39.00)
LDL Cholesterol: 113 mg/dL — ABNORMAL HIGH (ref 0–99)
VLDL: 24.2 mg/dL (ref 0.0–40.0)

## 2010-08-26 LAB — HEPATIC FUNCTION PANEL
Albumin: 4.2 g/dL (ref 3.5–5.2)
Alkaline Phosphatase: 79 U/L (ref 39–117)
Total Bilirubin: 0.5 mg/dL (ref 0.3–1.2)

## 2010-08-26 NOTE — Progress Notes (Signed)
Pilar Jarvis Payeur Date of Birth: 10-29-1945   History of Present Illness: Mrs. Vonada is seen today for followup. She has done fairly well from a cardiac standpoint. She reports as she was unable to tolerate even a low dose of thyroid hormone because of hair loss, weight gain, and personality changes. She did not try taking niacin because she was concerned about the interaction with her thyroid medication. She does complain of weakness that lasted about a month but has since resolved. She denies any significant chest pain or shortness of breath.  Current Outpatient Prescriptions on File Prior to Visit  Medication Sig Dispense Refill  . aspirin 325 MG tablet Take 81 mg by mouth daily.       . B Complex Vitamins (VITAMIN B COMPLEX PO) Take by mouth.        . Calcium Carbonate-Vitamin D (CALCIUM 500/D PO) Take by mouth.        . Cholecalciferol (VITAMIN D PO) Take by mouth. Taking 1000 daily       . lisinopril (PRINIVIL,ZESTRIL) 5 MG tablet Take 5 mg by mouth daily.        Marland Kitchen MAGNESIUM PO Take by mouth.        . metoprolol tartrate (LOPRESSOR) 25 MG tablet Take 25 mg by mouth 2 (two) times daily.        Marland Kitchen NITROSTAT 0.4 MG SL tablet Place 0.4 mg under the tongue every 5 (five) minutes as needed.       . Omega-3 Fatty Acids (FISH OIL PO) Take by mouth. Taking 1400 2 daily      . prasugrel (EFFIENT) 10 MG TABS Take by mouth daily.        . vitamin C (ASCORBIC ACID) 500 MG tablet Take 500 mg by mouth daily.          Allergies  Allergen Reactions  . Crestor (Rosuvastatin Calcium)     intolerant  . Niaspan (Niacin)     nausea  . Sulfa Antibiotics   . Thyroid Hormones     Past Medical History  Diagnosis Date  . Hyperlipidemia   . Coronary artery disease   . MI (myocardial infarction) Dec. 2011    stent mid right coronary  . Tobacco dependence   . Obesity     Past Surgical History  Procedure Date  . Cardiac catheterization 2011 Dec.    stent mid right coronary,Primus element stent    . Other surgical history     hysterectomy    History  Smoking status  . Current Everyday Smoker -- 1.0 packs/day for 47 years  . Types: Cigarettes  Smokeless tobacco  . Never Used    History  Alcohol Use No    Family History  Problem Relation Age of Onset  . Heart failure Mother   . Heart disease Father   . Diabetes Father   . Cancer Sister     colon cancer  . Heart disease Brother     Review of Systems: As noted in history of present illness All other systems were reviewed and are negative.  Physical Exam: BP 102/56  Pulse 60  Ht 5' (1.524 m)  Wt 160 lb (72.576 kg)  BMI 31.25 kg/m2 She is an overweight white female in no acute distress. Her HEENT exam is unremarkable. She has no JVD or bruits. Lungs are clear. Cardiac exam reveals a regular rate and rhythm without gallop, murmur, or click. Abdomen is soft and nontender. She has no masses or  bruits. Extremities are without edema. Pedal pulses are good. She is alert and oriented x3. Cranial nerves II through XII are intact. LABORATORY DATA:   Assessment / Plan:

## 2010-08-26 NOTE — Patient Instructions (Signed)
We will call with the results of your lab work today.  We may need to consider Niacin therapy depending on your lipids today.  I will see you again in 6 months.

## 2010-08-26 NOTE — Assessment & Plan Note (Signed)
She has made excellent progress from a cardiac standpoint. On her should continue with her exercise program and weight loss. We'll continue with efforts at risk factor modification.

## 2010-08-26 NOTE — Assessment & Plan Note (Signed)
Lab work noted today. Triglyceride levels have improved. Her LDL is 113 which is not at goal. She is intolerant to statin therapy. I have recommended a trial of niacin 500 mg daily.

## 2010-08-30 ENCOUNTER — Telehealth: Payer: Self-pay | Admitting: *Deleted

## 2010-08-30 NOTE — Telephone Encounter (Signed)
Message copied by Lorayne Bender on Tue Aug 30, 2010  9:18 AM ------      Message from: Sw[place], PETER M      Created: Fri Aug 26, 2010  1:30 PM       Triglycerides are better. LDL still up at 113. Goal of 70.  Intolerant of statins. I would recommend a trial of Niacin 500 mg qhs. LFTs normal.

## 2010-08-31 ENCOUNTER — Telehealth: Payer: Self-pay | Admitting: *Deleted

## 2010-08-31 NOTE — Telephone Encounter (Signed)
Called back for lab results. Advised Dr. Sw[place]iland wanted to put her on Niacin but she says she has tried that and upset her stomach. States was given Zetia at last OV but she hasn't taken med. States she just didn't want to take. Per Dr. Swaziland wants her to try Zetia. States she will try.

## 2010-09-07 ENCOUNTER — Other Ambulatory Visit: Payer: Self-pay | Admitting: Cardiovascular Disease

## 2010-09-07 NOTE — Telephone Encounter (Signed)
escribe medication per fax request  

## 2010-09-07 NOTE — Telephone Encounter (Signed)
Refill request

## 2010-10-10 ENCOUNTER — Other Ambulatory Visit: Payer: Self-pay | Admitting: Cardiovascular Disease

## 2010-11-07 ENCOUNTER — Other Ambulatory Visit: Payer: Self-pay | Admitting: Family Medicine

## 2010-11-07 DIAGNOSIS — Z1231 Encounter for screening mammogram for malignant neoplasm of breast: Secondary | ICD-10-CM

## 2010-12-29 ENCOUNTER — Ambulatory Visit
Admission: RE | Admit: 2010-12-29 | Discharge: 2010-12-29 | Disposition: A | Discharge status: Home / Self Care | Payer: Medicare Other | Source: Ambulatory Visit | Attending: Family Medicine | Admitting: Family Medicine

## 2010-12-29 DIAGNOSIS — Z1231 Encounter for screening mammogram for malignant neoplasm of breast: Secondary | ICD-10-CM

## 2011-02-09 ENCOUNTER — Telehealth: Payer: Self-pay | Admitting: Cardiology

## 2011-02-09 NOTE — Telephone Encounter (Signed)
New Problem:    Patient called in because last year she had a heart attack on 02/04/10 and Dr. Swaziland said that she could stop the Effient she was placed on a year later.  She was wondering if she could stop now. Please advise.

## 2011-02-11 IMAGING — CR DG CHEST 2V
2 series · 2 of 2 positions shown · non-contrast
Comparison: 12/09/2009

CLINICAL DATA: Chest pain and shortness of breath.

CHEST - 2 VIEW

[w chest pa]
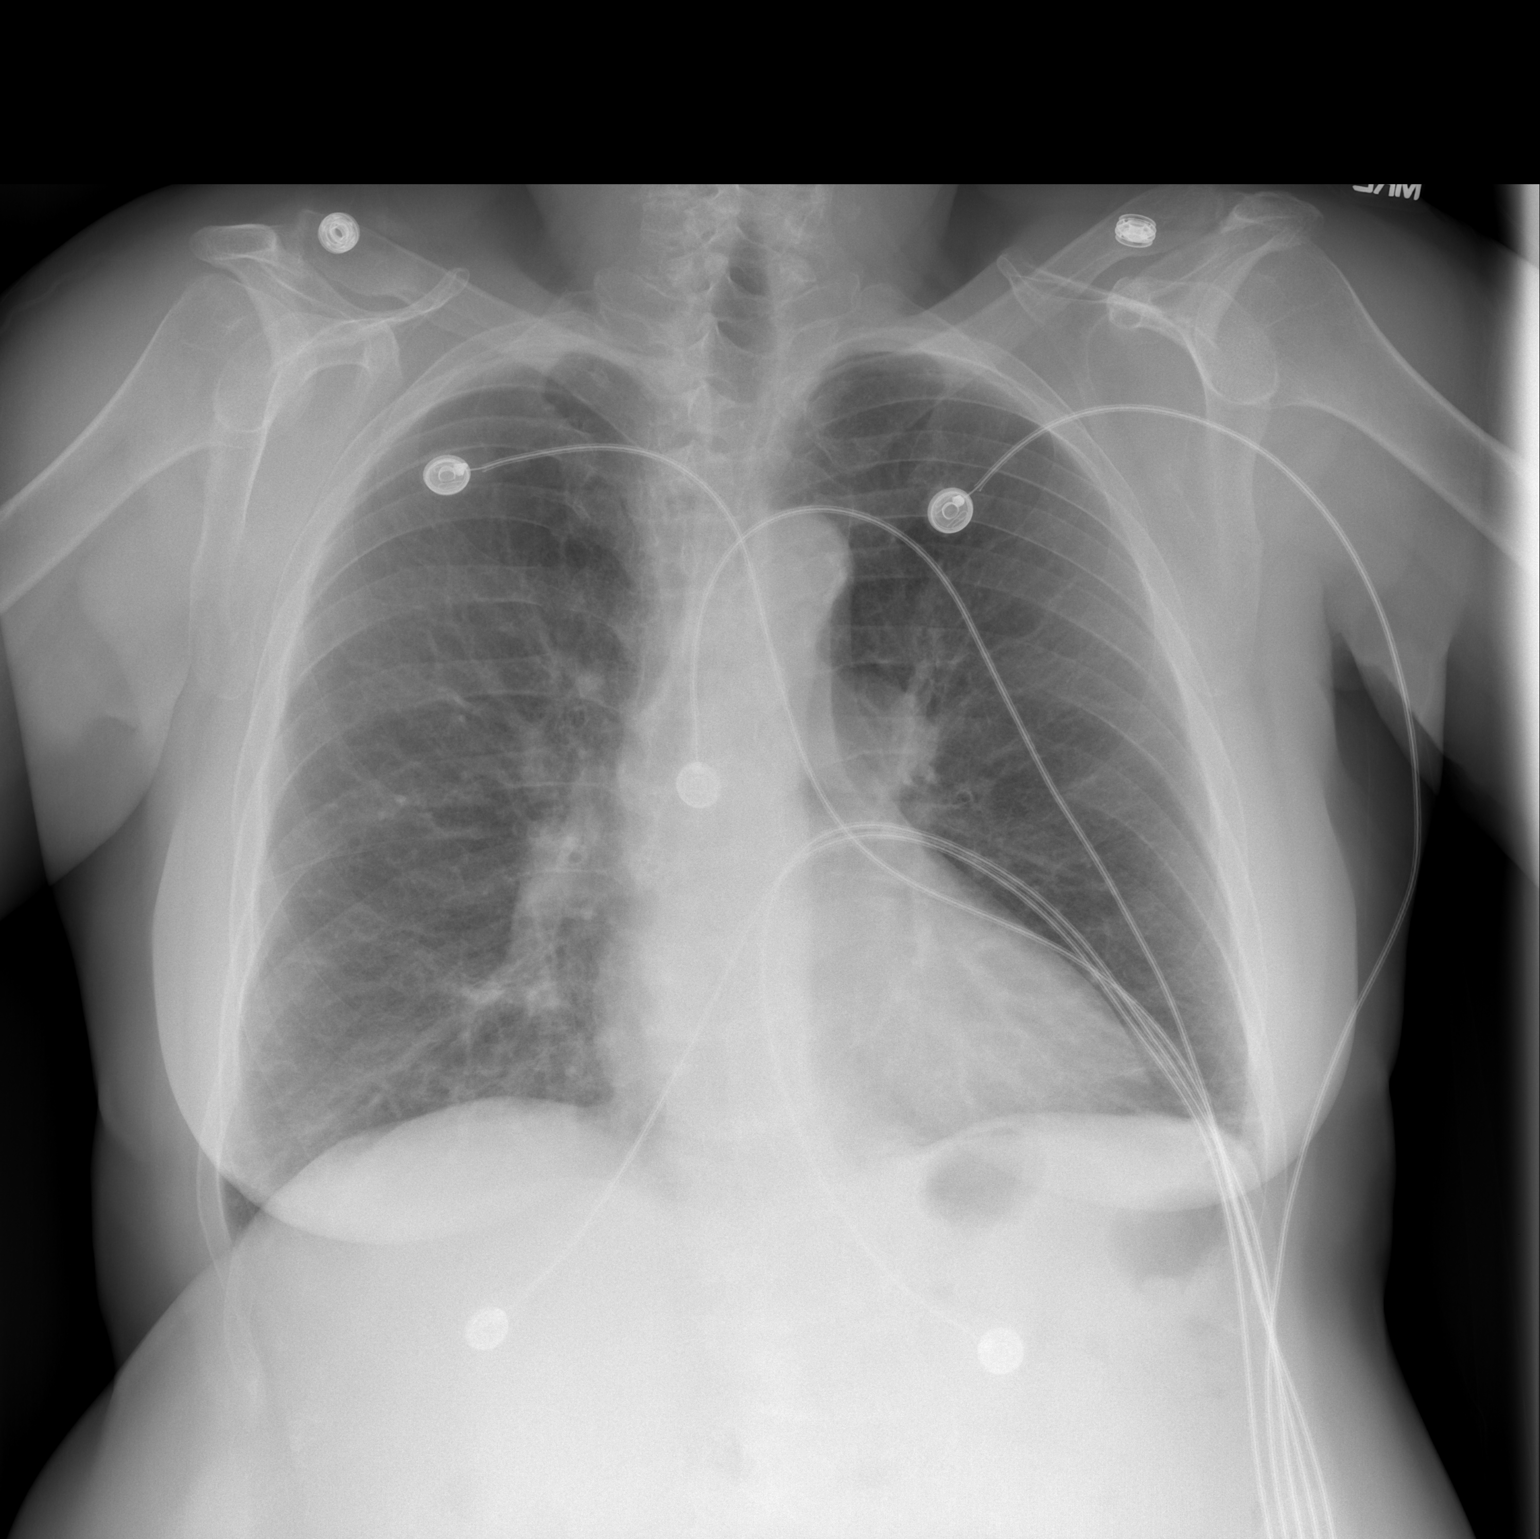

[w chest lat]
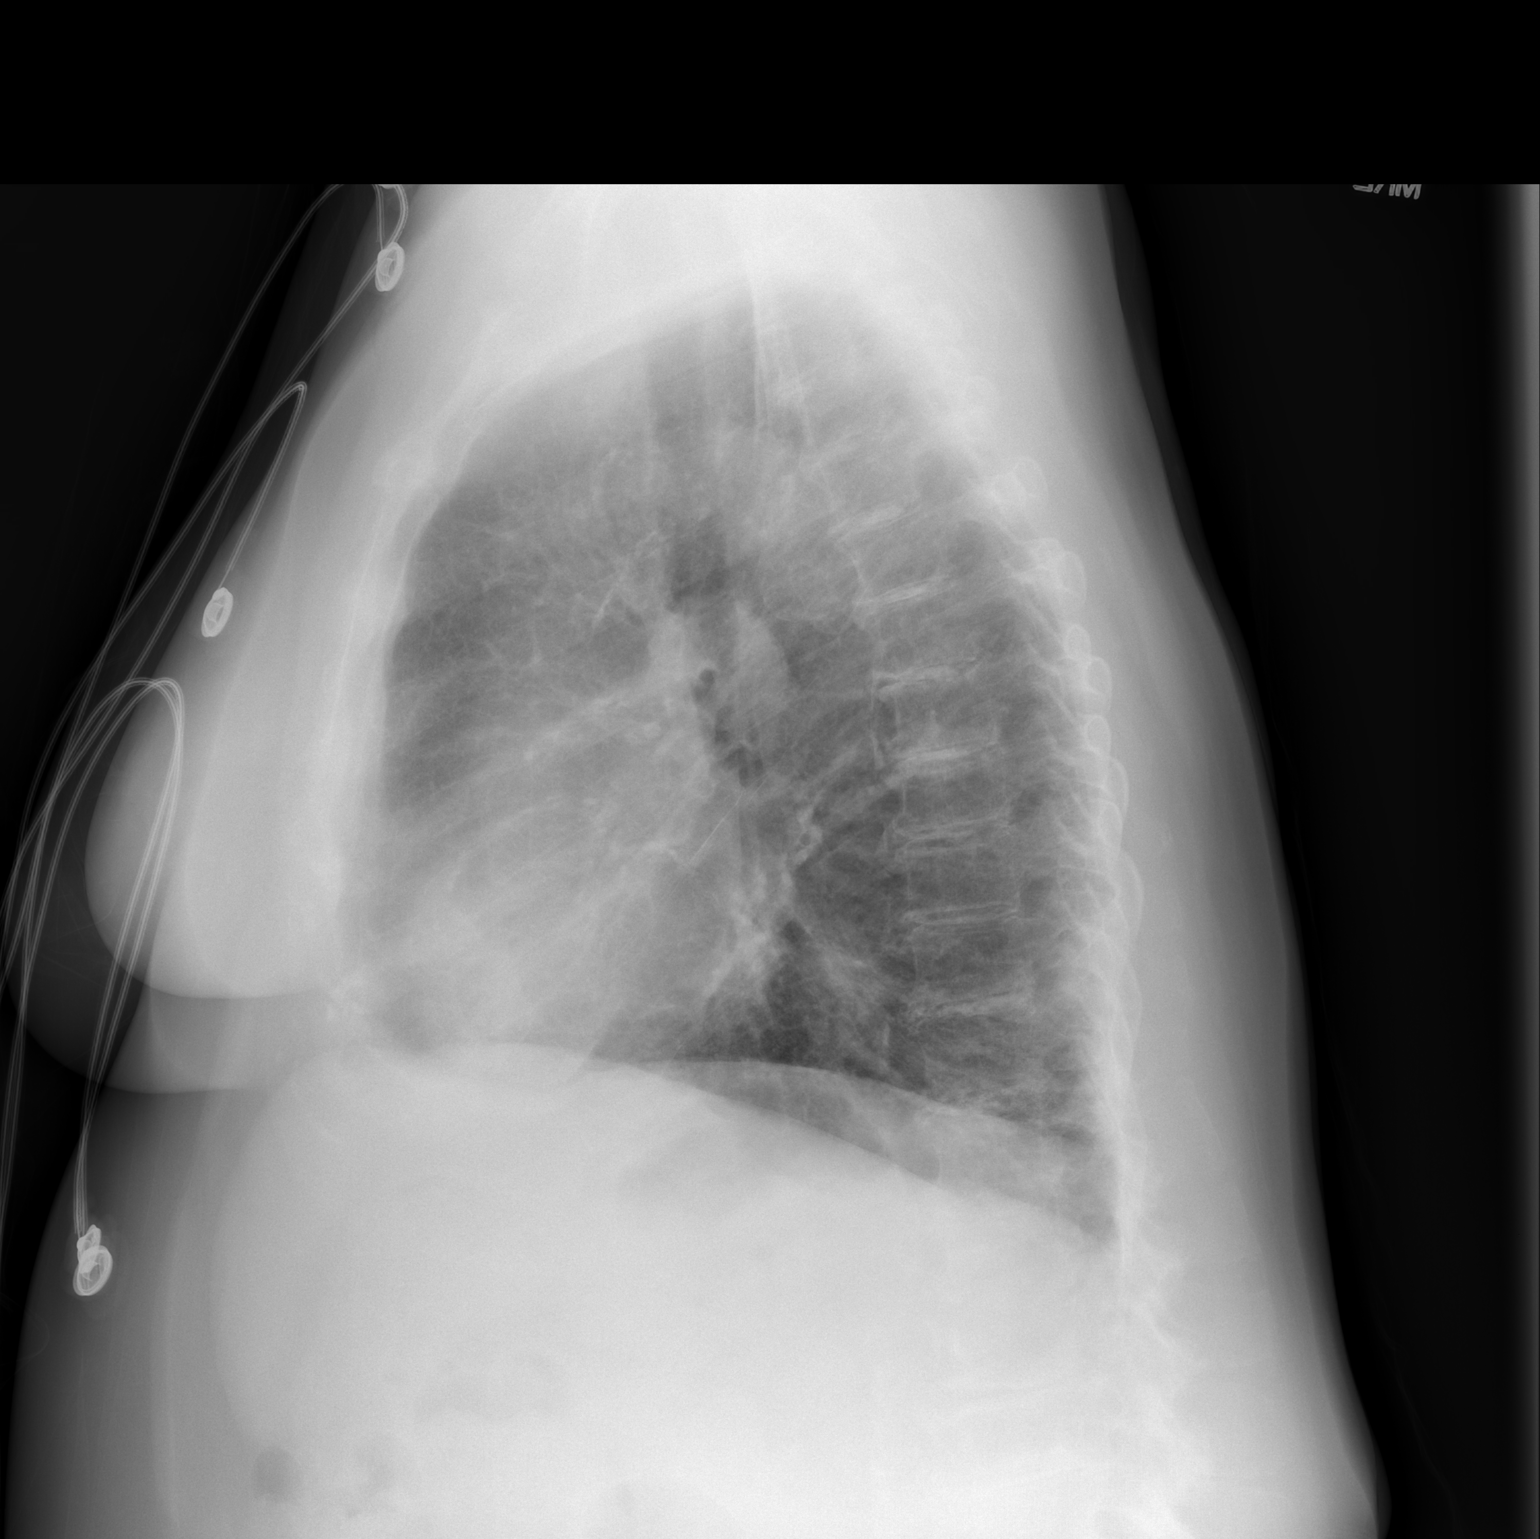

[2 of 2 positions shown; findings below may reference images not displayed]

FINDINGS: The heart size and vascularity are normal.  There is
chronic peribronchial thickening with slight scarring at the left
lung base.  There are no acute infiltrates or effusions.  No
significant osseous abnormality.
IMPRESSION: No acute abnormalities.  Mild chronic lung disease.

## 2011-02-13 NOTE — Telephone Encounter (Signed)
Called wanting to know if could stop Effient since it has been a year since had heart attack. Per Dr. Swaziland may stop effient but stay on ASA 81 mg.

## 2011-02-22 ENCOUNTER — Ambulatory Visit (INDEPENDENT_AMBULATORY_CARE_PROVIDER_SITE_OTHER): Payer: Medicare Other | Admitting: Cardiology

## 2011-02-22 ENCOUNTER — Encounter: Payer: Self-pay | Admitting: Cardiology

## 2011-02-22 VITALS — BP 101/61 | HR 69 | Ht 60.0 in | Wt 158.8 lb

## 2011-02-22 DIAGNOSIS — I10 Essential (primary) hypertension: Secondary | ICD-10-CM

## 2011-02-22 DIAGNOSIS — I2510 Atherosclerotic heart disease of native coronary artery without angina pectoris: Secondary | ICD-10-CM

## 2011-02-22 DIAGNOSIS — E785 Hyperlipidemia, unspecified: Secondary | ICD-10-CM

## 2011-02-22 NOTE — Progress Notes (Signed)
   Deanna Johnston Date of Birth: Jul 14, 1945   History of Present Illness: Deanna Johnston is seen today for followup. She has done fairly well from a cardiac standpoint. She denies any chest pain or shortness of breath. She reports intolerance previously to niacin because of gastric upset.  Current Outpatient Prescriptions on File Prior to Visit  Medication Sig Dispense Refill  . aspirin 325 MG tablet Take 81 mg by mouth daily.       Marland Kitchen lisinopril (PRINIVIL,ZESTRIL) 5 MG tablet TAKE ONE TABLET BY MOUTH EVERY DAY  30 tablet  5  . metoprolol tartrate (LOPRESSOR) 25 MG tablet TAKE ONE TABLET BY MOUTH TWICE DAILY  60 tablet  12  . NITROSTAT 0.4 MG SL tablet Place 0.4 mg under the tongue every 5 (five) minutes as needed.         Allergies  Allergen Reactions  . Crestor (Rosuvastatin Calcium)     intolerant  . Niaspan (Niacin)     nausea  . Sulfa Antibiotics   . Thyroid Hormones     Past Medical History  Diagnosis Date  . Hyperlipidemia   . Coronary artery disease   . MI (myocardial infarction) Dec. 2011    stent mid right coronary  . Tobacco dependence   . Obesity   . Leukocytosis     felt secondary to myocardial infarction  . Chest pain     Past Surgical History  Procedure Date  . Cardiac catheterization 2011 Dec.    stent mid right coronary,Primus element stent  . Other surgical history     hysterectomy    History  Smoking status  . Current Everyday Smoker -- 1.0 packs/day for 47 years  . Types: Cigarettes  Smokeless tobacco  . Never Used    History  Alcohol Use No    Family History  Problem Relation Age of Onset  . Heart failure Mother   . Heart disease Father   . Diabetes Father   . Cancer Sister     colon cancer  . Heart disease Brother     Review of Systems: Review of systems is positive for history of hypothyroidism. She reports that she stop taking all her vitamins and made some dietary changes and since then her TSH level is back down to 5. He has  a history of intolerance to thyroid hormone. All other systems were reviewed and are negative.  Physical Exam: BP 101/61  Pulse 69  Ht 5' (1.524 m)  Wt 158 lb 12.8 oz (72.031 kg)  BMI 31.01 kg/m2 She is an overweight white female in no acute distress. Her HEENT exam is unremarkable. She has no JVD or bruits. Lungs are clear. Cardiac exam reveals a regular rate and rhythm without gallop, murmur, or click. Abdomen is soft and nontender. She has no masses or bruits. Extremities are without edema. Pedal pulses are good. She is alert and oriented x3. Cranial nerves II through XII are intact. LABORATORY DATA: ECG today is normal.  Assessment / Plan:

## 2011-02-22 NOTE — Patient Instructions (Signed)
You may stop Effient. Stay on ASA and your other medications.  I will see you again in 6 months.  Keep your weight down.  You need regular aerobic exercise.

## 2011-02-22 NOTE — Assessment & Plan Note (Signed)
She is now one year out from her myocardial infarction and stenting of the mid right coronary. She would very much like to go off of Effient. I have okayed her to go off of this and take aspirin only. She needs to increase her aerobic activity.

## 2011-02-22 NOTE — Assessment & Plan Note (Signed)
She is intolerant to statins and niacin. I stressed the importance of dietary modifications. She thinks she is doing fairly well with her diet but her multiple times for she "treats" herself. She needs to be more diligent and to increase her aerobic activity. I will followup again in 6 months.

## 2011-03-06 ENCOUNTER — Other Ambulatory Visit: Payer: Self-pay | Admitting: Cardiology

## 2011-04-22 ENCOUNTER — Emergency Department (HOSPITAL_COMMUNITY)
Admission: EM | Admit: 2011-04-22 | Discharge: 2011-04-22 | Disposition: A | Discharge status: Home / Self Care | Payer: Medicare Other | Attending: Emergency Medicine | Admitting: Emergency Medicine

## 2011-04-22 ENCOUNTER — Emergency Department (HOSPITAL_COMMUNITY): Payer: Medicare Other

## 2011-04-22 ENCOUNTER — Encounter (HOSPITAL_COMMUNITY): Payer: Self-pay | Admitting: *Deleted

## 2011-04-22 DIAGNOSIS — Z79899 Other long term (current) drug therapy: Secondary | ICD-10-CM | POA: Insufficient documentation

## 2011-04-22 DIAGNOSIS — W208XXA Other cause of strike by thrown, projected or falling object, initial encounter: Secondary | ICD-10-CM | POA: Insufficient documentation

## 2011-04-22 DIAGNOSIS — I252 Old myocardial infarction: Secondary | ICD-10-CM | POA: Insufficient documentation

## 2011-04-22 DIAGNOSIS — S61209A Unspecified open wound of unspecified finger without damage to nail, initial encounter: Secondary | ICD-10-CM | POA: Insufficient documentation

## 2011-04-22 DIAGNOSIS — I2510 Atherosclerotic heart disease of native coronary artery without angina pectoris: Secondary | ICD-10-CM | POA: Insufficient documentation

## 2011-04-22 DIAGNOSIS — S62639A Displaced fracture of distal phalanx of unspecified finger, initial encounter for closed fracture: Secondary | ICD-10-CM | POA: Insufficient documentation

## 2011-04-22 DIAGNOSIS — S61219A Laceration without foreign body of unspecified finger without damage to nail, initial encounter: Secondary | ICD-10-CM

## 2011-04-22 MED ORDER — AMOXICILLIN-POT CLAVULANATE 875-125 MG PO TABS: 1.0000 | ORAL_TABLET | Freq: Two times a day (BID) | ORAL | Status: AC

## 2011-04-22 NOTE — ED Provider Notes (Signed)
History     CSN: [id number]  Arrival date & time 04/22/11  7829   First MD Initiated Contact with Patient 04/22/11 1019      Chief Complaint  Patient presents with  . Finger Injury    (Consider location/radiation/quality/duration/timing/severity/associated sxs/prior treatment) HPI This presents the emergency department after getting her hand smashed by a ladder.  She states that her tetanus shot is up-to-date.  She does not have any numbness or weakness of the finger.  Patient has a laceration above the distal interphalangeal joint.  There is bruising noted to the fingers well. Past Medical History  Diagnosis Date  . Hyperlipidemia   . Coronary artery disease   . MI (myocardial infarction) Dec. 2011    stent mid right coronary  . Tobacco dependence   . Obesity   . Leukocytosis     felt secondary to myocardial infarction  . Chest pain     Past Surgical History  Procedure Date  . Cardiac catheterization 2011 Dec.    stent mid right coronary,Primus element stent  . Other surgical history     hysterectomy    Family History  Problem Relation Age of Onset  . Heart failure Mother   . Heart disease Father   . Diabetes Father   . Cancer Sister     colon cancer  . Heart disease Brother     History  Substance Use Topics  . Smoking status: Current Everyday Smoker -- 0.5 packs/day for 47 years    Types: Cigarettes  . Smokeless tobacco: Never Used  . Alcohol Use: No    OB History    Grav Para Term Preterm Abortions TAB SAB Ect Mult Living                  Review of Systems All pertinent positives and negatives reviewed in the history of present illness  Allergies  Crestor; Niaspan; Sulfa antibiotics; and Thyroid hormones  Home Medications   Current Outpatient Rx  Name Route Sig Dispense Refill  . ASPIRIN EC 81 MG PO TBEC Oral Take 81 mg by mouth daily.    Marland Kitchen LISINOPRIL 5 MG PO TABS  TAKE ONE TABLET BY MOUTH EVERY DAY. 30 tablet 6  . METOPROLOL TARTRATE 25 MG  PO TABS Oral Take 25 mg by mouth daily.    Marland Kitchen NITROSTAT 0.4 MG SL SUBL Sublingual Place 0.4 mg under the tongue every 5 (five) minutes x 3 doses as needed. For chest pain.      BP 129/55  Pulse 90  Temp(Src) 98.3 F (36.8 C) (Oral)  Resp 18  SpO2 93%  Physical Exam  Constitutional: She appears well-developed and well-nourished.  HENT:  Head: Normocephalic and atraumatic.  Cardiovascular: Normal rate, regular rhythm and intact distal pulses.  Exam reveals no gallop and no friction rub.   No murmur heard. Pulmonary/Chest: Effort normal and breath sounds normal.  Musculoskeletal:       Right wrist: She exhibits normal range of motion.       Arms:   ED Course  Procedures (including critical care time)  Labs Reviewed - No data to display Dg Finger Index Right  04/22/2011  *RADIOLOGY REPORT*  Clinical Data: Finger injury.  RIGHT INDEX FINGER 2+V  Comparison: No priors.  Findings: Three views of the right second finger demonstrate a subtle lucency through the tip of the distal phalanx, concerning for a nondisplaced tuft fracture.  No additional fractures, subluxations or dislocations are appreciated.  IMPRESSION: 1.  Probable  nondisplaced tuft fracture through the distal phalanx of the right second finger.  Original Report Authenticated By: Florencia Reasons, M.D.     LACERATION REPAIR Performed by: Carlyle Dolly Authorized by: Carlyle Dolly Consent: Verbal consent obtained. Risks and benefits: risks, benefits and alternatives were discussed Consent given by: patient Patient identity confirmed: provided demographic data Prepped and Draped in normal sterile fashion Wound explored  Laceration Location:2nd digit just above DIP  Laceration Length 3 cm  No Foreign Bodies seen or palpated  Anesthesia: local infiltration  Local anesthetic: lidocaine 2%  Anesthetic total: 4ml  Irrigation method: syringe Amount of cleaning: standard  Skin closure: Prolene  5-0   Number of sutures: 3  Technique: simple interrupted.   Patient tolerance: Patient tolerated the procedure well with no immediate complications.   I spoke with Dr.Ortmann about the patient he felt like it was safe to clean the wound and sutured and he will followup with patient in his office as needed.  MDM          Carlyle Dolly, PA-C 04/22/11 1354

## 2011-04-22 NOTE — Discharge Instructions (Signed)
Keep wound clean and dry.  Followup with Dr.Ortmann. Your prescription is at the pharmacy.

## 2011-04-22 NOTE — ED Notes (Signed)
Pt from home with reports of index finger injury on right hand after aluminum ladder fell on hand and "smashed it".

## 2011-04-24 NOTE — ED Provider Notes (Signed)
Medical screening examination/treatment/procedure(s) were performed by non-physician practitioner and as supervising physician I was immediately available for consultation/collaboration.   Laray Anger, DO 04/24/11 1100

## 2011-05-11 ENCOUNTER — Telehealth: Payer: Self-pay | Admitting: Cardiology

## 2011-05-11 NOTE — Telephone Encounter (Signed)
New Msg: Stanton Kidney from [facility] calling needing to speak with nurse/MD regarding pt chart notes not being signed and needing someone to sign. Please return call to discuss further.

## 2011-05-16 NOTE — Telephone Encounter (Signed)
Debra from [facility] called no answer.LMTC.

## 2011-05-17 NOTE — Telephone Encounter (Signed)
Debra from [facility] called back form faxed for Dr.Jordan's signature .Form was signed and faxed back to [phone].

## 2011-05-31 ENCOUNTER — Encounter: Payer: Self-pay | Admitting: Cardiology

## 2011-06-03 ENCOUNTER — Other Ambulatory Visit: Payer: Self-pay | Admitting: Cardiovascular Disease

## 2011-08-23 ENCOUNTER — Ambulatory Visit (INDEPENDENT_AMBULATORY_CARE_PROVIDER_SITE_OTHER): Payer: Medicare Other | Admitting: Cardiology

## 2011-08-23 ENCOUNTER — Encounter: Payer: Self-pay | Admitting: Cardiology

## 2011-08-23 VITALS — BP 102/60 | HR 71 | Ht 60.0 in | Wt 155.0 lb

## 2011-08-23 DIAGNOSIS — I2510 Atherosclerotic heart disease of native coronary artery without angina pectoris: Secondary | ICD-10-CM

## 2011-08-23 DIAGNOSIS — Z720 Tobacco use: Secondary | ICD-10-CM

## 2011-08-23 DIAGNOSIS — E785 Hyperlipidemia, unspecified: Secondary | ICD-10-CM

## 2011-08-23 DIAGNOSIS — F17200 Nicotine dependence, unspecified, uncomplicated: Secondary | ICD-10-CM

## 2011-08-23 NOTE — Assessment & Plan Note (Signed)
We again reviewed the risk of continued smoking. Strongly urged her to stop completely.

## 2011-08-23 NOTE — Assessment & Plan Note (Signed)
Her recent lipids actually look quite good considering that she has not able to tolerate statins or niacin therapy.

## 2011-08-23 NOTE — Progress Notes (Signed)
   Deanna Johnston Date of Birth: [birth year]   History of Present Illness: Deanna Johnston is seen today for followup. She has done  well from a cardiac standpoint. She denies any chest pain or shortness of breath. She reports she is exercising 3 days a week. She continues to smoke less than a half a pack per day.  Current Outpatient Prescriptions on File Prior to Visit  Medication Sig Dispense Refill  . aspirin EC 81 MG tablet Take 81 mg by mouth daily.      Marland Kitchen lisinopril (PRINIVIL,ZESTRIL) 5 MG tablet TAKE ONE TABLET BY MOUTH EVERY DAY.  30 tablet  6  . metoprolol tartrate (LOPRESSOR) 25 MG tablet Take 25 mg by mouth daily.      Marland Kitchen NITROSTAT 0.4 MG SL tablet DISSOLVE ONE TABLET UNDER THE TONGUE EVERY 5 MINUTES AS NEEDED FOR CHEST PAIN.  DO NOT EXCEED A TOTAL OF 3 DOSES IN 15 MINUTES  25 each  2    Allergies  Allergen Reactions  . Crestor (Rosuvastatin Calcium)     intolerant  . Niaspan (Niacin)     nausea  . Sulfa Antibiotics   . Thyroid Hormones     Past Medical History  Diagnosis Date  . Hyperlipidemia   . Coronary artery disease   . MI (myocardial infarction) Dec. 2011    stent mid right coronary  . Tobacco dependence   . Obesity     Past Surgical History  Procedure Date  . Cardiac catheterization 2011 Dec.    stent mid right coronary,Primus element stent  . Other surgical history     hysterectomy    History  Smoking status  . Current Everyday Smoker -- 0.5 packs/day for 47 years  . Types: Cigarettes  Smokeless tobacco  . Never Used    History  Alcohol Use No    Family History  Problem Relation Age of Onset  . Heart failure Mother   . Heart disease Father   . Diabetes Father   . Cancer Sister     colon cancer  . Heart disease Brother     Review of Systems: Review of systems is positive for history of mild hypothyroidism.  He has a history of intolerance to thyroid hormone. All other systems were reviewed and are negative.  Physical Exam: BP 102/60   Pulse 71  Ht 5' (1.524 m)  Wt 155 lb (70.308 kg)  BMI 30.27 kg/m2  SpO2 94% She is an overweight white female in no acute distress. Her HEENT exam is unremarkable. She has no JVD or bruits. Lungs are clear. Cardiac exam reveals a regular rate and rhythm without gallop, murmur, or click. Abdomen is soft and nontender. She has no masses or bruits. Extremities are without edema. Pedal pulses are good. She is alert and oriented x3. Cranial nerves II through XII are intact. LABORATORY DATA: Dated 05/31/2011 complete chemistry panel was normal. Total cholesterol 174, or glycerides 116, HDL 46, LDL 103. TSH was 6.25.  Assessment / Plan:

## 2011-08-23 NOTE — Patient Instructions (Signed)
Continue your efforts at weight control and exercise.  Stop smoking completely  Continue your medications.  I will see you again in 6 months

## 2011-08-23 NOTE — Assessment & Plan Note (Signed)
She has no recurrent anginal symptoms. We will continue with aspirin only. Continue metoprolol and ACE inhibitor.

## 2011-10-04 ENCOUNTER — Other Ambulatory Visit: Payer: Self-pay | Admitting: Cardiology

## 2011-10-04 NOTE — Telephone Encounter (Signed)
Refilled lisinopril

## 2011-11-27 ENCOUNTER — Other Ambulatory Visit: Payer: Self-pay | Admitting: Family Medicine

## 2011-11-27 DIAGNOSIS — Z1231 Encounter for screening mammogram for malignant neoplasm of breast: Secondary | ICD-10-CM

## 2011-12-07 ENCOUNTER — Other Ambulatory Visit: Payer: Self-pay | Admitting: Cardiovascular Disease

## 2012-01-02 ENCOUNTER — Ambulatory Visit
Admission: RE | Admit: 2012-01-02 | Discharge: 2012-01-02 | Disposition: A | Discharge status: Home / Self Care | Payer: Medicare Other | Source: Ambulatory Visit | Attending: Family Medicine | Admitting: Family Medicine

## 2012-01-02 DIAGNOSIS — Z1231 Encounter for screening mammogram for malignant neoplasm of breast: Secondary | ICD-10-CM

## 2012-02-22 ENCOUNTER — Encounter: Payer: Self-pay | Admitting: Cardiology

## 2012-03-08 ENCOUNTER — Ambulatory Visit (INDEPENDENT_AMBULATORY_CARE_PROVIDER_SITE_OTHER): Payer: Medicare Other | Admitting: Cardiology

## 2012-03-08 ENCOUNTER — Encounter: Payer: Self-pay | Admitting: Cardiology

## 2012-03-08 VITALS — BP 116/70 | HR 62 | Resp 18 | Ht 60.0 in | Wt 155.0 lb

## 2012-03-08 DIAGNOSIS — I219 Acute myocardial infarction, unspecified: Secondary | ICD-10-CM

## 2012-03-08 DIAGNOSIS — F17200 Nicotine dependence, unspecified, uncomplicated: Secondary | ICD-10-CM

## 2012-03-08 DIAGNOSIS — E785 Hyperlipidemia, unspecified: Secondary | ICD-10-CM

## 2012-03-08 DIAGNOSIS — I2510 Atherosclerotic heart disease of native coronary artery without angina pectoris: Secondary | ICD-10-CM

## 2012-03-08 NOTE — Progress Notes (Signed)
Pilar Jarvis Skowronek Date of Birth: 01-06-1946   History of Present Illness: [name] is seen today for followup. She has done  well from a cardiac standpoint. She denies any chest pain or shortness of breath. She states she went on a binge in October and quit exercising. She is eating a lot of fast food. She gained weight. She continues to smoke. She is trying to work her way back into a healthy lifestyle.  Current Outpatient Prescriptions on File Prior to Visit  Medication Sig Dispense Refill  . aspirin EC 81 MG tablet Take 81 mg by mouth daily.      Marland Kitchen ESTRACE VAGINAL 0.1 MG/GM vaginal cream Place vaginally daily.       Marland Kitchen lisinopril (PRINIVIL,ZESTRIL) 5 MG tablet TAKE ONE TABLET BY MOUTH EVERY DAY  30 tablet  5  . metoprolol tartrate (LOPRESSOR) 25 MG tablet Take 25 mg by mouth daily.      Marland Kitchen NITROSTAT 0.4 MG SL tablet DISSOLVE ONE TABLET UNDER THE TONGUE EVERY 5 MINUTES AS NEEDED FOR CHEST PAIN.  DO NOT EXCEED A TOTAL OF 3 DOSES IN 15 MINUTES  25 each  2  . fluconazole (DIFLUCAN) 150 MG tablet Take 150 mg by mouth as directed.         Allergies  Allergen Reactions  . Crestor (Rosuvastatin Calcium)     intolerant  . Niaspan (Niacin)     nausea  . Sulfa Antibiotics   . Thyroid Hormones     Past Medical History  Diagnosis Date  . Hyperlipidemia   . Coronary artery disease   . MI (myocardial infarction) Dec. 2011    stent mid right coronary  . Tobacco dependence   . Obesity     Past Surgical History  Procedure Date  . Cardiac catheterization 2011 Dec.    stent mid right coronary,Primus element stent  . Other surgical history     hysterectomy    History  Smoking status  . Current Every Day Smoker -- 0.5 packs/day for 47 years  . Types: Cigarettes  Smokeless tobacco  . Never Used    History  Alcohol Use No    Family History  Problem Relation Age of Onset  . Heart failure Mother   . Heart disease Father   . Diabetes Father   . Cancer Sister     colon cancer    . Heart disease Brother     Review of Systems: As noted in history of present illness. All other systems were reviewed and are negative.  Physical Exam: BP 116/70  Pulse 62  Resp 18  Ht 5' (1.524 m)  Wt 155 lb (70.308 kg)  BMI 30.27 kg/m2  SpO2 93% She is an overweight white female in no acute distress. Her HEENT exam is unremarkable. She has no JVD or bruits. Lungs reveal few wheezes. Cardiac exam reveals a regular rate and rhythm without gallop, murmur, or click. Abdomen is soft and nontender. She has no masses or bruits. Extremities are without edema. Pedal pulses are good. She is alert and oriented x3. Cranial nerves II through XII are intact. LABORATORY DATA: Dated 02/22/2012. Complete chemistry panel was normal. Total cholesterol 193, triglycerides 100, HDL 56, and LDL 121. ECG today is normal.  Assessment / Plan: 1. Coronary disease status post stenting of the mid RCA in December 2011 with a drug-eluting stent. She is without recurrent angina. Continue aspirin, metoprolol, and ACE inhibitor.   2. Hypertension, controlled.  3. Hyperlipidemia. She is  intolerant to multiple medications. I stressed the importance of dietary modifications and weight loss.  4. Tobacco dependence. Again counseled her on smoking cessation. She is trying to reduce her tobacco use but is not ready to quit.

## 2012-03-08 NOTE — Patient Instructions (Signed)
Get back into an exercise program and heart healthy diet.  Try and stop smoking  I will see you in 6 months.

## 2012-04-09 ENCOUNTER — Other Ambulatory Visit: Payer: Self-pay | Admitting: *Deleted

## 2012-04-09 MED ORDER — LISINOPRIL 5 MG PO TABS: 5.0000 mg | ORAL_TABLET | ORAL | Status: DC

## 2012-04-09 MED ORDER — LISINOPRIL 5 MG PO TABS: 5.0000 mg | ORAL_TABLET | Freq: Every day | ORAL | Status: DC

## 2012-05-08 ENCOUNTER — Telehealth: Payer: Self-pay | Admitting: Cardiology

## 2012-05-08 NOTE — Telephone Encounter (Signed)
Spoke to patient was told ok with Dr.Jordan to take Ibuprofen as needed for knee pain.

## 2012-05-08 NOTE — Telephone Encounter (Signed)
New Problem:    Patient called in wanting to know if she would be able to take Ibuprofen.  Patient needs something for inflammation in her knees.  Please call back.

## 2012-08-02 ENCOUNTER — Other Ambulatory Visit (HOSPITAL_COMMUNITY)
Admission: RE | Admit: 2012-08-02 | Discharge: 2012-08-02 | Disposition: A | Discharge status: Home / Self Care | Payer: Medicare Other | Source: Ambulatory Visit | Attending: Gynecology | Admitting: Gynecology

## 2012-08-02 ENCOUNTER — Encounter: Payer: Self-pay | Admitting: Gynecology

## 2012-08-02 ENCOUNTER — Ambulatory Visit (INDEPENDENT_AMBULATORY_CARE_PROVIDER_SITE_OTHER): Payer: Medicare Other | Admitting: Gynecology

## 2012-08-02 VITALS — BP 122/76 | Ht <= 58 in | Wt 158.0 lb

## 2012-08-02 DIAGNOSIS — M810 Age-related osteoporosis without current pathological fracture: Secondary | ICD-10-CM

## 2012-08-02 DIAGNOSIS — Z1151 Encounter for screening for human papillomavirus (HPV): Secondary | ICD-10-CM | POA: Insufficient documentation

## 2012-08-02 DIAGNOSIS — N893 Dysplasia of vagina, unspecified: Secondary | ICD-10-CM

## 2012-08-02 DIAGNOSIS — Z01419 Encounter for gynecological examination (general) (routine) without abnormal findings: Secondary | ICD-10-CM | POA: Insufficient documentation

## 2012-08-02 DIAGNOSIS — N898 Other specified noninflammatory disorders of vagina: Secondary | ICD-10-CM

## 2012-08-02 DIAGNOSIS — N952 Postmenopausal atrophic vaginitis: Secondary | ICD-10-CM

## 2012-08-02 LAB — URINALYSIS W MICROSCOPIC + REFLEX CULTURE
Bilirubin Urine: NEGATIVE
Crystals: NONE SEEN
Glucose, UA: NEGATIVE mg/dL
Leukocytes, UA: NEGATIVE
Specific Gravity, Urine: 1.007 (ref 1.005–1.030)
Squamous Epithelial / LPF: NONE SEEN
Urobilinogen, UA: 0.2 mg/dL (ref 0.0–1.0)

## 2012-08-02 NOTE — Patient Instructions (Signed)
Followup for Pap smear results. Followup for bone density discussion about treatment options once I receive report from Dr. Nicholas Lose office. Stop smoking as we discussed.

## 2012-08-02 NOTE — Addendum Note (Signed)
Addended by: Dayna Barker on: 08/02/2012 09:58 AM   Modules accepted: Orders

## 2012-08-02 NOTE — Progress Notes (Signed)
Deanna Johnston 1945/08/01 [id number]        67 y.o.  G0P0 new patient for followup exam.  Former patient of Dr. Nicholas Lose and Dr. Duane Lope. Several issues noted below.  Past medical history,surgical history, medications, allergies, family history and social history were all reviewed and documented in the EPIC chart.  ROS:  Performed and pertinent positives and negatives are included in the history, assessment and plan .  Exam: Kim assistant Filed Vitals:   08/02/12 0915  BP: 122/76  Height: 4\' 10"  (1.473 m)  Weight: 158 lb (71.668 kg)   General appearance  Normal Skin grossly normal Head/Neck normal with no cervical or supraclavicular adenopathy thyroid normal Lungs  clear Cardiac RR, without RMG Abdominal  soft, nontender, without masses, organomegaly or hernia Breasts  examined lying and sitting without masses, retractions, discharge or axillary adenopathy. Pelvic  Ext/BUS/vagina  normal with atrophic changes  Adnexa  Without masses or tenderness    Anus and perineum  normal   Rectovaginal  normal sphincter tone without palpated masses or tenderness.    Assessment/Plan:  67 y.o. G0P0 new patient for followup exam.   1. High-grade vaginal dysplasia. Status post TAH/BSO 1983. 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 Has undergone multiple colposcopies and followup. Ultimately he had high-grade dysplasia 2011 and was treated with Efudex cream by Dr. Tenny Craw. Her followup Pap smears have been normal with reported 2013 Pap smear normal although I do not have a copy of this. Pap/HPV done today. We'll continue to follow with Pap smears given recent history of high-grade dysplasia. If Pap smear normal repeat in one year if abnormal then colposcopies. 2. Atrophic vaginal changes. Using Estrace vaginal cream sporadically. I reviewed the whole issue of use of estrogen to include the risks versus benefits. As she is not using it consistently have recommended to  stop using it and will follow. She is not having vaginal dryness she is not sexually active. She'll report any new symptoms and will readdress the issue at that time. 3. Mammography 12/2011. Repeat mammography this fall. SBE monthly reviewed. 4. Osteoporosis. DEXA 2011 should osteoporosis with T score -3.0. Reports DEXA 2014 at Dr. Nicholas Lose office although I do not have a copy of this. Apparently tried Fosamax once and had GI side effects and never continued. I reviewed the issues of osteoporosis and fracture risk and the devastating sequelae following a hip fracture. My strong recommendation to consider treatment. She has a problem with Fosamax consider Prolia or Reclast. Will obtain copy of the DEXA from Dr. Nicholas Lose office and I will further discuss. She knows to call me in 2 weeks to arrange a followup in its very important for her to followup on this. 5. Colonoscopy. Apparently they cannot do these for technical reasons and she has been studied with barium enemas in the past. She'll followup with her primary in reference to this as far as timing when to repeat. 6. Stop smoking. I encouraged and discussed stop smoking strategies. 7. Health maintenance. No blood work done as it is all done through her other physician's office. Followup for Pap smear results and exit discussion. Otherwise 1 year.    Dara Lords MD, 9:45 AM 08/02/2012

## 2012-08-09 ENCOUNTER — Other Ambulatory Visit: Payer: Self-pay

## 2012-08-09 MED ORDER — NITROGLYCERIN 0.4 MG SL SUBL: 0.4000 mg | SUBLINGUAL_TABLET | SUBLINGUAL | Status: DC | PRN

## 2012-09-03 ENCOUNTER — Telehealth: Payer: Self-pay | Admitting: *Deleted

## 2012-09-03 NOTE — Telephone Encounter (Signed)
Pt informed with pap result from June 2014 OV.

## 2012-09-04 ENCOUNTER — Ambulatory Visit (INDEPENDENT_AMBULATORY_CARE_PROVIDER_SITE_OTHER): Payer: Medicare Other | Admitting: Cardiology

## 2012-09-04 ENCOUNTER — Encounter: Payer: Self-pay | Admitting: Cardiology

## 2012-09-04 VITALS — BP 110/58 | HR 68 | Ht <= 58 in | Wt 159.0 lb

## 2012-09-04 DIAGNOSIS — E785 Hyperlipidemia, unspecified: Secondary | ICD-10-CM

## 2012-09-04 DIAGNOSIS — I2510 Atherosclerotic heart disease of native coronary artery without angina pectoris: Secondary | ICD-10-CM

## 2012-09-04 DIAGNOSIS — I219 Acute myocardial infarction, unspecified: Secondary | ICD-10-CM

## 2012-09-04 DIAGNOSIS — F17200 Nicotine dependence, unspecified, uncomplicated: Secondary | ICD-10-CM

## 2012-09-04 MED ORDER — LISINOPRIL 5 MG PO TABS: 5.0000 mg | ORAL_TABLET | Freq: Every day | ORAL | Status: DC

## 2012-09-04 NOTE — Patient Instructions (Signed)
You need to get more aerobic exercise  Quit smoking  Increase lisinopril to 5 mg daily  I will see you in 6 months.

## 2012-09-04 NOTE — Progress Notes (Signed)
Deanna Johnston Date of Birth: [birth year]   History of Present Illness: [name] is seen today for followup. She has done  well from a cardiac standpoint. She denies any chest pain or shortness of breath. She continues to smoke 10 cigarettes per day. He is really not getting any exercise because of low back problems. He states that her fasting lab work is done in January with her primary care. For some reason her lisinopril has only been prescribed every other day.  Current Outpatient Prescriptions on File Prior to Visit  Medication Sig Dispense Refill  . aspirin EC 81 MG tablet Take 81 mg by mouth daily.      . metoprolol tartrate (LOPRESSOR) 25 MG tablet Take 25 mg by mouth daily.      . nitroGLYCERIN (NITROSTAT) 0.4 MG SL tablet Place 1 tablet (0.4 mg total) under the tongue every 5 (five) minutes as needed for chest pain.  25 tablet  5   No current facility-administered medications on file prior to visit.    Allergies  Allergen Reactions  . Crestor (Rosuvastatin Calcium)     intolerant  . Niaspan (Niacin)     nausea  . Sulfa Antibiotics   . Thyroid Hormones     Past Medical History  Diagnosis Date  . Hyperlipidemia   . Coronary artery disease   . MI (myocardial infarction) Dec. 2011    stent mid right coronary  . Tobacco dependence   . Obesity     Past Surgical History  Procedure Laterality Date  . Cardiac catheterization  2011 Dec.    stent mid right coronary,Primus element stent  . Other surgical history      hysterectomy  . Abdominal hysterectomy      TAH BSO  . Oophorectomy      BSO  . Colposcopy      History  Smoking status  . Current Every Day Smoker -- 0.50 packs/day for 47 years  . Types: Cigarettes  Smokeless tobacco  . Never Used    History  Alcohol Use  . Yes    Comment: Rare    Family History  Problem Relation Age of Onset  . Heart failure Mother   . Cancer Mother     Stomach  . Heart disease Father   . Diabetes Father   .  Cancer Sister     colon cancer  . Heart disease Brother     Review of Systems: As noted in history of present illness. All other systems were reviewed and are negative.  Physical Exam: BP 110/58  Pulse 68  Ht 4\' 10"  (1.473 m)  Wt 159 lb (72.122 kg)  BMI 33.24 kg/m2  SpO2 92% She is an overweight white female in no acute distress. Her HEENT exam is unremarkable. She has no JVD or bruits. Lungs are clear. Cardiac exam reveals a regular rate and rhythm without gallop, murmur, or click. Abdomen is soft and nontender. She has no masses or bruits. Extremities are without edema. Pedal pulses are good. She is alert and oriented x3. Cranial nerves II through XII are intact. LABORATORY DATA: Dated 02/22/2012. Complete chemistry panel was normal. Total cholesterol 193, triglycerides 100, HDL 56, and LDL 121.   Assessment / Plan: 1. Coronary disease status post stenting of the mid RCA in December 2011 with a drug-eluting stent. She is without recurrent angina. Continue aspirin, metoprolol, and ACE inhibitor.   2. Hypertension, controlled. She should take lisinopril daily.  3. Hyperlipidemia. She is  intolerant to multiple medications. I stressed the importance of dietary modifications and weight loss.  4. Tobacco dependence. Again counseled her on smoking cessation. She is planning on quitting.

## 2012-11-08 ENCOUNTER — Other Ambulatory Visit: Payer: Self-pay

## 2012-11-08 DIAGNOSIS — Z1231 Encounter for screening mammogram for malignant neoplasm of breast: Secondary | ICD-10-CM

## 2012-12-31 ENCOUNTER — Other Ambulatory Visit: Payer: Self-pay | Admitting: Cardiovascular Disease

## 2013-01-02 ENCOUNTER — Ambulatory Visit
Admission: RE | Admit: 2013-01-02 | Discharge: 2013-01-02 | Disposition: A | Discharge status: Home / Self Care | Payer: Medicare Other | Source: Ambulatory Visit

## 2013-01-02 DIAGNOSIS — Z1231 Encounter for screening mammogram for malignant neoplasm of breast: Secondary | ICD-10-CM

## 2013-01-02 NOTE — Telephone Encounter (Signed)
Confirmed with patient she takes her metoprolol bid.

## 2013-02-03 ENCOUNTER — Telehealth: Payer: Self-pay | Admitting: Cardiology

## 2013-02-03 NOTE — Telephone Encounter (Signed)
Advised patient ok to take plain Mucinex

## 2013-02-03 NOTE — Telephone Encounter (Signed)
New message  Patient has a cold/congestion and wants to know if it is okay to take Mucinex? Please call and advise.

## 2013-03-03 ENCOUNTER — Institutional Professional Consult (permissible substitution): Payer: Medicare Other | Admitting: Internal Medicine

## 2013-03-11 ENCOUNTER — Ambulatory Visit (INDEPENDENT_AMBULATORY_CARE_PROVIDER_SITE_OTHER)
Admission: RE | Admit: 2013-03-11 | Discharge: 2013-03-11 | Disposition: A | Discharge status: Home / Self Care | Payer: Medicare Other | Source: Ambulatory Visit | Attending: Internal Medicine | Admitting: Internal Medicine

## 2013-03-11 ENCOUNTER — Encounter: Payer: Self-pay | Admitting: Internal Medicine

## 2013-03-11 ENCOUNTER — Ambulatory Visit (INDEPENDENT_AMBULATORY_CARE_PROVIDER_SITE_OTHER): Payer: Medicare Other | Admitting: Internal Medicine

## 2013-03-11 VITALS — BP 94/58 | HR 77 | Temp 97.8°F | Ht <= 58 in | Wt 153.8 lb

## 2013-03-11 DIAGNOSIS — F17200 Nicotine dependence, unspecified, uncomplicated: Secondary | ICD-10-CM

## 2013-03-11 DIAGNOSIS — I10 Essential (primary) hypertension: Secondary | ICD-10-CM

## 2013-03-11 DIAGNOSIS — R05 Cough: Secondary | ICD-10-CM | POA: Insufficient documentation

## 2013-03-11 DIAGNOSIS — R059 Cough, unspecified: Secondary | ICD-10-CM

## 2013-03-11 DIAGNOSIS — J449 Chronic obstructive pulmonary disease, unspecified: Secondary | ICD-10-CM

## 2013-03-11 MED ORDER — VALSARTAN 80 MG PO TABS: 80.0000 mg | ORAL_TABLET | Freq: Every day | ORAL | Status: DC

## 2013-03-11 NOTE — Patient Instructions (Signed)
The key is to stop smoking completely before smoking completely stops you - this is the most important aspect of your care  Stop lisinopril one daily   Start diovan 80 mg one daily   Please remember to go to the lab and x-ray department downstairs for your tests - we will call you with the results when they are available.    Please schedule a follow up office visit in 4 weeks, sooner if needed with pfts

## 2013-03-11 NOTE — Assessment & Plan Note (Signed)
The most common causes of chronic cough in immunocompetent adults include the following: upper airway cough syndrome (UACS), previously referred to as postnasal drip syndrome (PNDS), which is caused by variety of rhinosinus conditions; (2) asthma; (3) GERD; (4) chronic bronchitis from cigarette smoking or other inhaled environmental irritants; (5) nonasthmatic eosinophilic bronchitis; and (6) bronchiectasis.   These conditions, singly or in combination, have accounted for up to 94% of the causes of chronic cough in prospective studies.   Other conditions have constituted no >6% of the causes in prospective studies These have included bronchogenic carcinoma, chronic interstitial pneumonia, sarcoidosis, left ventricular failure, ACEI-induced cough, and aspiration from a condition associated with pharyngeal dysfunction.    Chronic cough is often simultaneously caused by more than one condition. A single cause has been found from 38 to 82% of the time, multiple causes from 18 to 62%. Multiply caused cough has been the result of three diseases up to 42% of the time.       Most likely this is  Classic Upper airway cough syndrome, so named because it's frequently impossible to sort out how much is  CR/sinusitis with freq throat clearing (which can be related to primary GERD)   vs  causing  secondary (" extra esophageal")  GERD from wide swings in gastric pressure that occur with throat clearing, often  promoting self use of mint and menthol lozenges that reduce the lower esophageal sphincter tone and exacerbate the problem further in a cyclical fashion.   These are the same pts (now being labeled as having "irritable larynx syndrome" by some cough centers) who not infrequently have a history of having failed to tolerate ace inhibitors,  dry powder inhalers or biphosphonates or report having atypical reflux symptoms that don't respond to standard doses of PPI , and are easily confused as having aecopd or asthma  flares by even experienced allergists/ pulmonologists.   rec trial off acei first then regoup in 4 weeks with pfts

## 2013-03-11 NOTE — Progress Notes (Signed)
   Subjective:    Patient ID: Deanna Johnston, female    DOB: 04/07/1945   MRN: [id number]  HPI  30 yowf active smoker with tendency to rhinitis/cough x adulthood better since around 2000 on one tsp honey per day then cough started early Dec 2014 and persisted so referred 03/11/2013 by Lady Deutscher.  03/11/2013 1st [facility] office visit/ [name] on ACEi cc persistent cough x 8 weeks she attributes to getting the pneumonia booster more day > night mostly  non-productive  And assoc with sense of pnds / throat tickle.  Does not wake her from sleep and not aware of it until after stirs in am. Already tried abx and inhalers not helping   No obvious day to day or daytime variabilty or assoc limiting sob  or cp or chest tightness, subjective wheeze overt sinus or hb symptoms. No unusual exp hx or h/o childhood pna/ asthma or knowledge of premature birth.  Sleeping ok without nocturnal  or early am exacerbation  of respiratory  c/o's or need for noct saba. Also denies any obvious fluctuation of symptoms with weather or environmental changes or other aggravating or alleviating factors except as outlined above   Current Medications, Allergies, Complete Past Medical History, Past Surgical History, Family History, and Social History were reviewed in [employer] record.      Review of Systems  Constitutional: Negative for fever, chills and unexpected weight change.  HENT: Positive for postnasal drip and rhinorrhea. Negative for congestion, dental problem, ear pain, nosebleeds, sinus pressure, sneezing, sore throat, trouble swallowing and voice change.   Eyes: Negative for visual disturbance.  Respiratory: Positive for cough. Negative for choking and shortness of breath.   Cardiovascular: Negative for chest pain and leg swelling.  Gastrointestinal: Negative for vomiting, abdominal pain and diarrhea.  Genitourinary: Negative for difficulty urinating.  Musculoskeletal: Negative for  arthralgias.  Skin: Negative for rash.  Neurological: Negative for tremors, syncope and headaches.  Hematological: Does not bruise/bleed easily.       Objective:   Physical Exam  amb wf nad  Wt Readings from Last 3 Encounters:  03/11/13 153 lb 12.8 oz (69.763 kg)  09/04/12 159 lb (72.122 kg)  08/02/12 158 lb (71.668 kg)     HEENT mild turbinate edema.  Oropharynx no thrush or excess pnd or cobblestoning.  No JVD or cervical adenopathy. Mild accessory muscle hypertrophy. Trachea midline, nl thryroid. Chest was hyperinflated by percussion with diminished breath sounds and moderate increased exp time with mid exp wheeze. Hoover sign positive at mid inspiration. Regular rate and rhythm without murmur gallop or rub or increase P2 or edema.  Abd: no hsm, nl excursion. Ext warm without cyanosis or clubbing.     CXR  03/11/2013 : No acute cardiopulmonary process. Minimal scarring left lung base.       Assessment & Plan:

## 2013-03-12 ENCOUNTER — Telehealth: Payer: Self-pay | Admitting: Internal Medicine

## 2013-03-12 ENCOUNTER — Ambulatory Visit (INDEPENDENT_AMBULATORY_CARE_PROVIDER_SITE_OTHER): Payer: Medicare Other | Admitting: Cardiology

## 2013-03-12 ENCOUNTER — Encounter: Payer: Self-pay | Admitting: Cardiology

## 2013-03-12 VITALS — BP 104/58 | HR 76 | Ht <= 58 in | Wt 152.1 lb

## 2013-03-12 DIAGNOSIS — I219 Acute myocardial infarction, unspecified: Secondary | ICD-10-CM

## 2013-03-12 DIAGNOSIS — F17200 Nicotine dependence, unspecified, uncomplicated: Secondary | ICD-10-CM

## 2013-03-12 DIAGNOSIS — E785 Hyperlipidemia, unspecified: Secondary | ICD-10-CM

## 2013-03-12 DIAGNOSIS — I2510 Atherosclerotic heart disease of native coronary artery without angina pectoris: Secondary | ICD-10-CM

## 2013-03-12 DIAGNOSIS — J449 Chronic obstructive pulmonary disease, unspecified: Secondary | ICD-10-CM | POA: Insufficient documentation

## 2013-03-12 NOTE — Telephone Encounter (Signed)
Notes Recorded by Rosana Berger, CMA on 03/12/2013 at 11:51 AM LMTCB ------  Notes Recorded by Tanda Rockers, MD on 03/12/2013 at 8:24 AM Call pt: Reviewed cxr and no acute change so no change in recommendations made at ov  Pt advised. Perris Bing, CMA

## 2013-03-12 NOTE — Patient Instructions (Signed)
Continue your efforts at smoking cessation.  It is OK to switch lisinopril to Diovan.  I will see you in 6 months.

## 2013-03-12 NOTE — Assessment & Plan Note (Signed)
Trial off acei 03/11/2013 for cough / pseudowheeze > rec diovan 80 mg one daily

## 2013-03-12 NOTE — Assessment & Plan Note (Signed)
I reviewed the Flethcher curve with patient that basically indicates  if you quit smoking when your best day FEV1 is still well preserved it is highly unlikely you will progress to severe disease and informed the patient there was no medication on the market that has proven to change the curve or the likelihood of progression.  Therefore stopping smoking and maintaining abstinence is the most important aspect of care, not choice of inhalers or for that matter, doctors.    Needs pfts to see where she is on the curve and committ to quit (discussed separately )

## 2013-03-12 NOTE — Progress Notes (Signed)
Quick Note:  LMTCB ______ 

## 2013-03-12 NOTE — Progress Notes (Signed)
[place] Date of Birth: 1945/06/18   History of Present Illness: Deanna Johnston is seen today for followup. She has done  well from a cardiac standpoint. She denies any chest pain. She continues to smoke but is trying to quit with a nicotine patch. She has had a recent URI treated with antibiotics. Was placed on prednisone but this tore up her stomach causing nausea and loss of appetite. Weight is down 7 lbs. Saw Dr. Melvyn Novas yesterday who recommended switching lisinopril to Diovan. PFTs planned.  Current Outpatient Prescriptions on File Prior to Visit  Medication Sig Dispense Refill  . aspirin EC 81 MG tablet Take 81 mg by mouth daily.      Marland Kitchen levothyroxine (SYNTHROID, LEVOTHROID) 50 MCG tablet Take 50 mcg by mouth daily before breakfast.      . metoprolol tartrate (LOPRESSOR) 25 MG tablet TAKE ONE TABLET BY MOUTH TWICE DAILY.  60 tablet  11  . nitroGLYCERIN (NITROSTAT) 0.4 MG SL tablet Place 1 tablet (0.4 mg total) under the tongue every 5 (five) minutes as needed for chest pain.  25 tablet  5  . valsartan (DIOVAN) 80 MG tablet Take 1 tablet (80 mg total) by mouth daily.  30 tablet  11   No current facility-administered medications on file prior to visit.    Allergies  Allergen Reactions  . Crestor [Rosuvastatin Calcium]     intolerant  . Niaspan [Niacin]     nausea  . Prednisone     "just makes me sick"  . Sulfa Antibiotics   . Thyroid Hormones     Past Medical History  Diagnosis Date  . Hyperlipidemia   . Coronary artery disease   . MI (myocardial infarction) Dec. 2011    stent mid right coronary  . Tobacco dependence   . Obesity     Past Surgical History  Procedure Laterality Date  . Cardiac catheterization  2011 Dec.    stent mid right coronary,Primus element stent  . Other surgical history      hysterectomy  . Abdominal hysterectomy      TAH BSO  . Oophorectomy      BSO  . Colposcopy      History  Smoking status  . Current Every Day Smoker -- 0.50  packs/day for 47 years  . Types: Cigarettes  Smokeless tobacco  . Never Used    History  Alcohol Use  . Yes    Comment: Rare    Family History  Problem Relation Age of Onset  . Heart failure Mother   . Stomach cancer Mother   . Heart disease Father   . Diabetes Father   . Colon cancer Sister   . Heart disease Brother     Review of Systems: As noted in history of present illness. All other systems were reviewed and are negative.  Physical Exam: BP 104/58  Pulse 76  Ht 4' 9.5" (1.461 m)  Wt 152 lb 1.9 oz (69.001 kg)  BMI 32.33 kg/m2 She is an overweight white female in no acute distress. Her HEENT exam is unremarkable. She has no JVD or bruits. Lungs reveal scattered wheezes. Cardiac exam reveals a regular rate and rhythm without gallop, murmur, or click. Abdomen is soft and nontender. She has no masses or bruits. Extremities are without edema. Pedal pulses are good. She is alert and oriented x3. Cranial nerves II through XII are intact.  LABORATORY DATA: Lab Results  Component Value Date   WBC 13.4* 02/06/2010  HGB 15.4* 02/06/2010   HCT 46.3* 02/06/2010   PLT 210 02/06/2010   GLUCOSE 93 04/22/2010   CHOL 190 08/26/2010   TRIG 121.0 08/26/2010   HDL 52.50 08/26/2010   LDLCALC 113* 08/26/2010   ALT 17 08/26/2010   AST 25 08/26/2010   NA 138 04/22/2010   K 5.2 04/22/2010   CL 103 04/22/2010   CREATININE 0.83 04/22/2010   BUN 16 04/22/2010   CO2 26 04/22/2010   TSH 4.284 02/04/2010   INR 1.02 02/04/2010   HGBA1C  Value: 5.2 (NOTE)                                                                       According to the ADA Clinical Practice Recommendations for 2011, when HbA1c is used as a screening test:   >=6.5%   Diagnostic of Diabetes Mellitus           (if abnormal result  is confirmed)  5.7-6.4%   Increased risk of developing Diabetes Mellitus  References:Diagnosis and Classification of Diabetes Mellitus,Diabetes [id number],0272,53(GUYQI 1):S62-S69 and Standards of Medical Care in          Diabetes - 2011,Diabetes [id number],2595,63  (Suppl 1):S11-S61. 02/04/2010   Ecg today demonstrates NSR with a normal Ecg.   Assessment / Plan: 1. Coronary disease status post stenting of the mid RCA in December 2011 with a drug-eluting stent. She is without recurrent angina. Continue aspirin, metoprolol.  2. Hypertension, controlled. Agree with switching lisinopril to Diovan due to persistent cough.  3. Hyperlipidemia. She is intolerant to multiple medications. I stressed the importance of dietary modifications and weight loss.  4. Tobacco dependence. Again counseled her on smoking cessation. She is planning on quitting with the use of nicotine patch.

## 2013-03-12 NOTE — Assessment & Plan Note (Signed)
>   5 min  I took an extended  opportunity with this patient to outline the consequences of continued cigarette use  in airway disorders based on all the data we have from the multiple national lung health studies (perfomed over decades at millions of dollars in cost)  indicating that smoking cessation, not choice of inhalers or physicians, is the most important aspect of care.

## 2013-04-11 ENCOUNTER — Other Ambulatory Visit: Payer: Self-pay | Admitting: Internal Medicine

## 2013-04-11 DIAGNOSIS — R059 Cough, unspecified: Secondary | ICD-10-CM

## 2013-04-11 DIAGNOSIS — R05 Cough: Secondary | ICD-10-CM

## 2013-04-14 ENCOUNTER — Ambulatory Visit (INDEPENDENT_AMBULATORY_CARE_PROVIDER_SITE_OTHER): Payer: Medicare Other | Admitting: Internal Medicine

## 2013-04-14 ENCOUNTER — Ambulatory Visit: Payer: Medicare Other | Admitting: Internal Medicine

## 2013-04-14 ENCOUNTER — Encounter: Payer: Self-pay | Admitting: Internal Medicine

## 2013-04-14 VITALS — BP 90/60 | HR 64 | Temp 97.9°F | Ht <= 58 in | Wt 153.0 lb

## 2013-04-14 DIAGNOSIS — R059 Cough, unspecified: Secondary | ICD-10-CM

## 2013-04-14 DIAGNOSIS — I10 Essential (primary) hypertension: Secondary | ICD-10-CM

## 2013-04-14 DIAGNOSIS — J449 Chronic obstructive pulmonary disease, unspecified: Secondary | ICD-10-CM

## 2013-04-14 DIAGNOSIS — R05 Cough: Secondary | ICD-10-CM

## 2013-04-14 LAB — PULMONARY FUNCTION TEST
DL/VA % pred: 80 %
DL/VA: 3.13 ml/min/mmHg/L
DLCO unc % pred: 66 %
DLCO unc: 10.81 ml/min/mmHg
FEF 25-75 POST: 0.47 L/s
FEF 25-75 PRE: 0.62 L/s
FEF2575-%CHANGE-POST: -24 %
FEF2575-%PRED-POST: 29 %
FEF2575-%PRED-PRE: 38 %
FEV1-%Change-Post: -7 %
FEV1-%PRED-POST: 55 %
FEV1-%Pred-Pre: 59 %
FEV1-POST: 0.95 L
FEV1-Pre: 1.03 L
FEV1FVC-%Change-Post: -5 %
FEV1FVC-%PRED-PRE: 90 %
FEV6-%Change-Post: -1 %
FEV6-%PRED-PRE: 68 %
FEV6-%Pred-Post: 66 %
FEV6-PRE: 1.49 L
FEV6-Post: 1.46 L
FEV6FVC-%CHANGE-POST: 0 %
FEV6FVC-%PRED-PRE: 104 %
FEV6FVC-%Pred-Post: 105 %
FVC-%Change-Post: -2 %
FVC-%PRED-POST: 63 %
FVC-%Pred-Pre: 65 %
FVC-PRE: 1.5 L
FVC-Post: 1.46 L
POST FEV1/FVC RATIO: 65 %
PRE FEV1/FVC RATIO: 69 %
Post FEV6/FVC ratio: 100 %
Pre FEV6/FVC Ratio: 99 %

## 2013-04-14 MED ORDER — PANTOPRAZOLE SODIUM 40 MG PO TBEC: 40.0000 mg | DELAYED_RELEASE_TABLET | Freq: Every day | ORAL | Status: DC

## 2013-04-14 MED ORDER — FAMOTIDINE 20 MG PO TABS
ORAL_TABLET | ORAL | Status: DC
Start: 2013-04-14 — End: 2013-08-05

## 2013-04-14 NOTE — Assessment & Plan Note (Signed)
-   PFTs 04/14/2013  FEV1  1.03 (59%) ratio 69 and no better with dlco 66%   She really does not have enough copd to warrant rx though the rattling cough is typical of cb and is best treated by smoking cessation, not bronchodilators.  In addition, I reviewed the Flethcher curve with patient that basically indicates  if you quit smoking when your best day FEV1 is still well preserved it is highly unlikely you will progress to severe disease and informed the patient there was no medication on the market that has proven to change the curve or the likelihood of progression.  Therefore stopping smoking and maintaining abstinence is the most important aspect of care, not choice of inhalers or for that matter, doctors.    Pulmonary f/u can be prn

## 2013-04-14 NOTE — Patient Instructions (Addendum)
You have GOLD II COPD, the only way to keep it from progressing is to stop smoking now.  If the cough starts bothering you again: Pantoprazole (protonix) 40 mg   Take 30-60 min before first meal of the day and Pepcid 20 mg one bedtime until return to office - this is the best way to tell whether stomach acid is contributing to your problem.    GERD (REFLUX)  is an extremely common cause of respiratory symptoms, many times with no significant heartburn at all.    It can be treated with medication, but also with lifestyle changes including avoidance of late meals, excessive alcohol, smoking cessation, and avoid fatty foods, chocolate, peppermint, colas, red wine, and acidic juices such as orange juice.  NO MINT OR MENTHOL PRODUCTS SO NO COUGH DROPS  USE SUGARLESS CANDY INSTEAD (jolley ranchers or Stover's)  NO OIL BASED VITAMINS - use powdered substitutes.    Please schedule a follow up office visit in 4 weeks, sooner if needed

## 2013-04-14 NOTE — Progress Notes (Signed)
PFT done today. No lung volumes per Wert.

## 2013-04-14 NOTE — Assessment & Plan Note (Signed)
Trial off acei 03/11/2013 for cough / pseudowheeze > improved  rec no change rx = diovan 80 mg one daily

## 2013-04-14 NOTE — Assessment & Plan Note (Signed)
Trial off acei rec 03/11/2013 > improved  Would avoid acei in this setting

## 2013-04-14 NOTE — Progress Notes (Addendum)
   Subjective:    Patient ID: Deanna Johnston, female    DOB: 05/20/1945   MRN: [id number]    Brief patient profile:  89 yowf active smoker with tendency to rhinitis/cough x adulthood better since around 2000 on one tsp honey per day then cough started early Dec 2014 and persisted so referred 03/11/2013 by Lady Deutscher proved to have probable acei cough and unrelated  COPD GOLD II documented 04/14/2013     History of Present Illness    03/11/2013 1st [facility] office visit/ Deanna Johnston on ACEi cc persistent cough x 8 weeks she attributes to getting the pneumonia booster more day > night mostly  non-productive  And assoc with sense of pnds / throat tickle.  Does not wake her from sleep and not aware of it until after stirs in am. Already tried abx and inhalers not helping rec The key is to stop smoking completely before smoking completely stops you - this is the most important aspect of your care Stop lisinopril one daily  Start diovan 80 mg one daily      04/14/2013 f/u ov/Deanna Johnston re: GOLD II COPD/ still smoking Cc cough now mostly at hs now whereas was more all day before/ somewhat rattling features, but no excess or purulent sputum production. Not limited by breathing from desired activities  - no need for saba     No obvious day to day or daytime variabilty or assoc  cp or chest tightness, subjective wheeze overt sinus or hb symptoms. No unusual exp hx or h/o childhood pna/ asthma or knowledge of premature birth.  Sleeping ok without nocturnal  or early am exacerbation  of respiratory  c/o's or need for noct saba. Also denies any obvious fluctuation of symptoms with weather or environmental changes or other aggravating or alleviating factors except as outlined above   Current Medications, Allergies, Complete Past Medical History, Past Surgical History, Family History, and Social History were reviewed in [employer] record.  ROS  The following are not active complaints unless  bolded sore throat, dysphagia, dental problems, itching, sneezing,  nasal congestion or excess/ purulent secretions, ear ache,   fever, chills, sweats, unintended wt loss, pleuritic or exertional cp, hemoptysis,  orthopnea pnd or leg swelling, presyncope, palpitations, heartburn, abdominal pain, anorexia, nausea, vomiting, diarrhea  or change in bowel or urinary habits, change in stools or urine, dysuria,hematuria,  rash, arthralgias, visual complaints, headache, numbness weakness or ataxia or problems with walking or coordination,  change in mood/affect or memory.           Objective:   Physical Exam  amb wf nad with rattling on cough maneuver  04/14/2013     153 Wt Readings from Last 3 Encounters:  03/11/13 153 lb 12.8 oz (69.763 kg)  09/04/12 159 lb (72.122 kg)  08/02/12 158 lb (71.668 kg)     HEENT mild turbinate edema.  Oropharynx no thrush or excess pnd or cobblestoning.  No JVD or cervical adenopathy. Mild accessory muscle hypertrophy. Trachea midline, nl thryroid. Chest was hyperinflated by percussion with diminished breath sounds and moderate increased exp time with mid exp wheeze. Hoover sign positive at mid inspiration. Regular rate and rhythm without murmur gallop or rub or increase P2 or edema.  Abd: no hsm, nl excursion. Ext warm without cyanosis or clubbing.     CXR  03/11/2013 : No acute cardiopulmonary process. Minimal scarring left lung base.       Assessment & Plan:

## 2013-05-12 ENCOUNTER — Ambulatory Visit: Payer: Medicare Other | Admitting: Internal Medicine

## 2013-08-05 ENCOUNTER — Ambulatory Visit (INDEPENDENT_AMBULATORY_CARE_PROVIDER_SITE_OTHER): Payer: Medicare Other | Admitting: Gynecology

## 2013-08-05 ENCOUNTER — Encounter: Payer: Self-pay | Admitting: Gynecology

## 2013-08-05 ENCOUNTER — Other Ambulatory Visit (HOSPITAL_COMMUNITY)
Admission: RE | Admit: 2013-08-05 | Discharge: 2013-08-05 | Disposition: A | Discharge status: Home / Self Care | Payer: Medicare Other | Source: Ambulatory Visit | Attending: Gynecology | Admitting: Gynecology

## 2013-08-05 VITALS — BP 120/74 | Ht 59.0 in | Wt 151.0 lb

## 2013-08-05 DIAGNOSIS — Z124 Encounter for screening for malignant neoplasm of cervix: Secondary | ICD-10-CM

## 2013-08-05 DIAGNOSIS — N952 Postmenopausal atrophic vaginitis: Secondary | ICD-10-CM

## 2013-08-05 DIAGNOSIS — N898 Other specified noninflammatory disorders of vagina: Secondary | ICD-10-CM

## 2013-08-05 DIAGNOSIS — N893 Dysplasia of vagina, unspecified: Secondary | ICD-10-CM

## 2013-08-05 DIAGNOSIS — M810 Age-related osteoporosis without current pathological fracture: Secondary | ICD-10-CM

## 2013-08-05 NOTE — Progress Notes (Signed)
Deanna Johnston 1945/08/30 [id number]        68 y.o.  G0P0 for followup exam. Several issues noted below.  Past medical history,surgical history, problem list, medications, allergies, family history and social history were all reviewed and documented as reviewed in the EPIC chart.  ROS:  12 system ROS performed with pertinent positives and negatives included in the history, assessment and plan.  Included Systems: General, HEENT, Neck, Cardiovascular, Pulmonary, Gastrointestinal, Genitourinary, Musculoskeletal, Dermatologic, Endocrine, Hematological, Neurologic, Psychiatric Additional significant findings :  None   Exam: Kim assistant Filed Vitals:   08/05/13 0950  BP: 120/74  Height: 4\' 11"  (1.499 m)  Weight: 151 lb (68.493 kg)   General appearance:  Normal affect, orientation and appearance. Skin: Grossly normal HEENT: Without gross lesions.  No cervical or supraclavicular adenopathy. Thyroid normal.  Lungs:  Clear without wheezing, rales or rhonchi Cardiac: RR, without RMG Abdominal:  Soft, nontender, without masses, guarding, rebound, organomegaly or hernia Breasts:  Examined lying and sitting without masses, retractions, discharge or axillary adenopathy. Pelvic:  Ext/BUS/vagina with generalized atrophic changes. Pap of cuff done   Adnexa  Without masses or tenderness    Anus and perineum  Normal   Rectovaginal  Normal sphincter tone without palpated masses or tenderness.    Assessment/Plan:  68 y.o. G0P0 female for followup exam.   1. High-grade vaginal dysplasia. Status post TAH/BSO 1983. Long history of vaginal dysplasia dating back 2001 per records brought with her previously. Has undergone multiple colposcopies and followup. Ultimately she had high-grade dysplasia 2011 and was treated with Efudex cream by Dr. Harrington Challenger. Her followup Pap smears have been normal with reported 2013 Pap smear normal although I do not have a copy of this. Pap/HPV last year negative. We'll continue to  follow with Pap smears given recent history of high-grade dysplasia. Pap smear of vaginal cuff done today. 2. Osteoporosis. DEXA 2011 showed osteoporosis with T score -3.0. Could not locate any followup DEXA. Previously reviewed options for treatment and her increased risk of fracture with devastating sequelae such as a hip fracture. Had tried Fosamax in the past historically with GI issues. Reviewed Prolia/Reclast. Patient was to call me in followup last year and never did. I again reviewed my recommendation for treatment and options. Patient's fearful of long acting medication such as Prolia and Reclast. Would consider a monthly pill such as Actonel to see if she does better than with Fosamax. We'll proceed to arrange for this and she knows that if she does not hear from my office within 2 weeks to call in followup to make sure that she arranges for this. Also recommended baseline DEXA and now and she agrees to schedule. Increase calcium vitamin D reviewed. 3. Atrophic vaginal changes. Had been on Estrace vaginal cream previously but stopped it last year and she's done well without significant vaginal dryness. Is not sexually active. Will continue to monitor. 4. Mammography 12/2012. Continue with annual mammography. SBE monthly reviewed. 5. Colonoscopy coming due and she knows to check and schedule with her gastroenterologist. 6. Continues to smoke despite multiple recommendations to stop by her various doctors. Does not plan to stop smoking. 7. Health maintenance. No routine blood work done as it is done through her primary physician's office. Followup for bone density otherwise annually.    Note: This document was prepared with digital dictation and possible smart phrase technology. Any transcriptional errors that result from this process are unintentional.   Anastasio Auerbach MD, 10:24 AM 08/05/2013

## 2013-08-05 NOTE — Addendum Note (Signed)
Addended by: Nelva Nay on: 08/05/2013 11:10 AM   Modules accepted: Orders

## 2013-08-05 NOTE — Patient Instructions (Signed)
Followup for bone density as scheduled. Office will contact you to discuss arranging for the once a month bone medication.  Osteoporosis Throughout your life, your body breaks down old bone and replaces it with new bone. As you get older, your body does not replace bone as quickly as it breaks it down. By the age of 3 years, most people begin to gradually lose bone because of the imbalance between bone loss and replacement. Some people lose more bone than others. Bone loss beyond a specified normal degree is considered osteoporosis.  Osteoporosis affects the strength and durability of your bones. The inside of the ends of your bones and your flat bones, like the bones of your pelvis, look like honeycomb, filled with tiny open spaces. As bone loss occurs, your bones become less dense. This means that the open spaces inside your bones become bigger and the walls between these spaces become thinner. This makes your bones weaker. Bones of a person with osteoporosis can become so weak that they can break (fracture) during minor accidents, such as a simple fall. CAUSES  The following factors have been associated with the development of osteoporosis:  Smoking.  Drinking more than 2 alcoholic drinks several days per week.  Long-term use of certain medicines:  Corticosteroids.  Chemotherapy medicines.  Thyroid medicines.  Antiepileptic medicines.  Gonadal hormone suppression medicine.  Immunosuppression medicine.  Being underweight.  Lack of physical activity.  Lack of exposure to the sun. This can lead to vitamin D deficiency.  Certain medical conditions:  Certain inflammatory bowel diseases, such as Crohn disease and ulcerative colitis.  Diabetes.  Hyperthyroidism.  Hyperparathyroidism. RISK FACTORS Anyone can develop osteoporosis. However, the following factors can increase your risk of developing osteoporosis:  Gender--Women are at higher risk than men.  Age--Being older  than 50 years increases your risk.  Ethnicity--White and Asian people have an increased risk.  Weight --Being extremely underweight can increase your risk of osteoporosis.  Family history of osteoporosis--Having a family member who has developed osteoporosis can increase your risk. SYMPTOMS  Usually, people with osteoporosis have no symptoms.  DIAGNOSIS  Signs during a physical exam that may prompt your caregiver to suspect osteoporosis include:  Decreased height. This is usually caused by the compression of the bones that form your spine (vertebrae) because they have weakened and become fractured.  A curving or rounding of the upper back (kyphosis). To confirm signs of osteoporosis, your caregiver may request a procedure that uses 2 low-dose X-ray beams with different levels of energy to measure your bone mineral density (dual-energy X-ray absorptiometry [DXA]). Also, your caregiver may check your level of vitamin D. TREATMENT  The goal of osteoporosis treatment is to strengthen bones in order to decrease the risk of bone fractures. There are different types of medicines available to help achieve this goal. Some of these medicines work by slowing the processes of bone loss. Some medicines work by increasing bone density. Treatment also involves making sure that your levels of calcium and vitamin D are adequate. PREVENTION  There are things you can do to help prevent osteoporosis. Adequate intake of calcium and vitamin D can help you achieve optimal bone mineral density. Regular exercise can also help, especially resistance and weight-bearing activities. If you smoke, quitting smoking is an important part of osteoporosis prevention. MAKE SURE YOU:  Understand these instructions.  Will watch your condition.  Will get help right away if you are not doing well or get worse. FOR MORE INFORMATION www.osteo.org  and EquipmentWeekly.com.ee Document Released: 11/09/2004 Document Revised: 05/27/2012 Document  Reviewed: 01/14/2011 [facility] Patient Information 2015 [place], [place]. This information is not intended to replace advice given to you by your health care provider. Make sure you discuss any questions you have with your health care provider.

## 2013-08-06 LAB — URINALYSIS W MICROSCOPIC + REFLEX CULTURE
BACTERIA UA: NONE SEEN
BILIRUBIN URINE: NEGATIVE
CASTS: NONE SEEN
CRYSTALS: NONE SEEN
GLUCOSE, UA: NEGATIVE mg/dL
Hgb urine dipstick: NEGATIVE
KETONES UR: NEGATIVE mg/dL
Leukocytes, UA: NEGATIVE
Nitrite: NEGATIVE
PH: 5.5 (ref 5.0–8.0)
Protein, ur: NEGATIVE mg/dL
SPECIFIC GRAVITY, URINE: 1.015 (ref 1.005–1.030)
UROBILINOGEN UA: 0.2 mg/dL (ref 0.0–1.0)

## 2013-08-06 LAB — CYTOLOGY - PAP

## 2013-08-08 ENCOUNTER — Telehealth: Payer: Self-pay | Admitting: *Deleted

## 2013-08-08 MED ORDER — RISEDRONATE SODIUM 150 MG PO TABS: 150.0000 mg | ORAL_TABLET | ORAL | Status: DC

## 2013-08-08 NOTE — Telephone Encounter (Signed)
Per staff message "I would like this patient to start on Actonel 150 mg monthly" I will send Rx to pharmacy.

## 2013-09-10 ENCOUNTER — Ambulatory Visit (INDEPENDENT_AMBULATORY_CARE_PROVIDER_SITE_OTHER): Payer: Medicare Other | Admitting: Cardiology

## 2013-09-10 ENCOUNTER — Encounter: Payer: Self-pay | Admitting: Cardiology

## 2013-09-10 VITALS — BP 128/67 | HR 65 | Ht 60.0 in | Wt 151.0 lb

## 2013-09-10 DIAGNOSIS — F17200 Nicotine dependence, unspecified, uncomplicated: Secondary | ICD-10-CM

## 2013-09-10 DIAGNOSIS — I25119 Atherosclerotic heart disease of native coronary artery with unspecified angina pectoris: Secondary | ICD-10-CM

## 2013-09-10 DIAGNOSIS — I2510 Atherosclerotic heart disease of native coronary artery without angina pectoris: Secondary | ICD-10-CM

## 2013-09-10 DIAGNOSIS — I10 Essential (primary) hypertension: Secondary | ICD-10-CM

## 2013-09-10 DIAGNOSIS — E785 Hyperlipidemia, unspecified: Secondary | ICD-10-CM

## 2013-09-10 DIAGNOSIS — I209 Angina pectoris, unspecified: Secondary | ICD-10-CM

## 2013-09-10 NOTE — Progress Notes (Signed)
[place] Date of Birth: June 02, 1945   History of Present Illness: Deanna Johnston is seen today for followup. She is s/p stenting of the mid RCA in Dec 2011 with a DES. She has done  well from a cardiac standpoint. She denies any chest pain. She continues to smoke. She is not exercising.  Her cough resolved after stopping ACEi. She is trying to watch her diet better. She is intolerant to multiple cholesterol lowering drugs.  Current Outpatient Prescriptions on File Prior to Visit  Medication Sig Dispense Refill  . aspirin EC 81 MG tablet Take 81 mg by mouth daily.      Marland Kitchen levothyroxine (SYNTHROID, LEVOTHROID) 50 MCG tablet Take 50 mcg by mouth daily before breakfast.      . metoprolol tartrate (LOPRESSOR) 25 MG tablet TAKE ONE TABLET BY MOUTH TWICE DAILY.  60 tablet  11  . nitroGLYCERIN (NITROSTAT) 0.4 MG SL tablet Place 1 tablet (0.4 mg total) under the tongue every 5 (five) minutes as needed for chest pain.  25 tablet  5  . valsartan (DIOVAN) 80 MG tablet Take 1 tablet (80 mg total) by mouth daily.  30 tablet  11   No current facility-administered medications on file prior to visit.    Allergies  Allergen Reactions  . Crestor [Rosuvastatin Calcium]     intolerant  . Niaspan [Niacin]     nausea  . Prednisone     "just makes me sick"  . Sulfa Antibiotics Swelling  . Thyroid Hormones     Past Medical History  Diagnosis Date  . Hyperlipidemia   . Coronary artery disease   . MI (myocardial infarction) Dec. 2011    stent mid right coronary  . Tobacco dependence   . Obesity   . Cervical dysplasia   . VAIN (vaginal intraepithelial neoplasia) 2011    Efudex treatment    Past Surgical History  Procedure Laterality Date  . Cardiac catheterization  2011 Dec.    stent mid right coronary,Primus element stent  . Other surgical history      hysterectomy  . Abdominal hysterectomy      TAH BSO  . Oophorectomy      BSO  . Colposcopy    . Anal fistula repair      History   Smoking status  . Current Every Day Smoker -- 0.50 packs/day for 47 years  . Types: Cigarettes  Smokeless tobacco  . Never Used    History  Alcohol Use  . Yes    Comment: Rare    Family History  Problem Relation Age of Onset  . Heart failure Mother   . Stomach cancer Mother   . Heart disease Father   . Diabetes Father   . Colon cancer Sister   . Heart disease Brother     Review of Systems: As noted in history of present illness. All other systems were reviewed and are negative.  Physical Exam: BP 128/67  Pulse 65  Ht 5' (1.524 m)  Wt 151 lb (68.493 kg)  BMI 29.49 kg/m2 She is an overweight white female in no acute distress. Her HEENT exam is unremarkable. She has no JVD or bruits. Lungs reveal scattered wheezes. Cardiac exam reveals a regular rate and rhythm without gallop, murmur, or click. Abdomen is soft and nontender. She has no masses or bruits. Extremities are without edema. Pedal pulses are good. She is alert and oriented x3. Cranial nerves II through XII are intact.  LABORATORY DATA: Lab Results  Component Value Date   WBC 13.4* 02/06/2010   HGB 15.4* 02/06/2010   HCT 46.3* 02/06/2010   PLT 210 02/06/2010   GLUCOSE 93 04/22/2010   CHOL 190 08/26/2010   TRIG 121.0 08/26/2010   HDL 52.50 08/26/2010   LDLCALC 113* 08/26/2010   ALT 17 08/26/2010   AST 25 08/26/2010   NA 138 04/22/2010   K 5.2 04/22/2010   CL 103 04/22/2010   CREATININE 0.83 04/22/2010   BUN 16 04/22/2010   CO2 26 04/22/2010   TSH 4.284 02/04/2010   INR 1.02 02/04/2010   HGBA1C  Value: 5.2 (NOTE)                                                                       According to the ADA Clinical Practice Recommendations for 2011, when HbA1c is used as a screening test:   >=6.5%   Diagnostic of Diabetes Mellitus           (if abnormal result  is confirmed)  5.7-6.4%   Increased risk of developing Diabetes Mellitus  References:Diagnosis and Classification of Diabetes Mellitus,Diabetes [id number],6979,48(AXKPV  1):S62-S69 and Standards of Medical Care in         Diabetes - 2011,Diabetes [id number],2707,86  (Suppl 1):S11-S61. 02/04/2010    Assessment / Plan: 1. Coronary disease status post stenting of the mid RCA in December 2011 with a drug-eluting stent. She is without recurrent angina. Continue aspirin, metoprolol.  2. Hypertension, controlled.  3. Hyperlipidemia. She is intolerant to multiple medications. I stressed the importance of dietary modifications and weight loss. She may be a candidate for a PSCK-9 inhibitor depending on cost.  4. Tobacco dependence. Again counseled her on smoking cessation.

## 2013-09-10 NOTE — Patient Instructions (Signed)
Quit smoking  Get regular aerobic exercise   Continue your current therapy  I will see you in 6 months.

## 2013-10-12 ENCOUNTER — Other Ambulatory Visit: Payer: Self-pay | Admitting: Cardiology

## 2013-11-12 ENCOUNTER — Other Ambulatory Visit: Payer: Self-pay

## 2013-11-12 DIAGNOSIS — Z1231 Encounter for screening mammogram for malignant neoplasm of breast: Secondary | ICD-10-CM

## 2014-01-05 ENCOUNTER — Ambulatory Visit
Admission: RE | Admit: 2014-01-05 | Discharge: 2014-01-05 | Disposition: A | Discharge status: Home / Self Care | Payer: Medicare Other | Source: Ambulatory Visit

## 2014-01-05 ENCOUNTER — Encounter (INDEPENDENT_AMBULATORY_CARE_PROVIDER_SITE_OTHER): Payer: Self-pay

## 2014-01-05 DIAGNOSIS — Z1231 Encounter for screening mammogram for malignant neoplasm of breast: Secondary | ICD-10-CM

## 2014-01-20 ENCOUNTER — Telehealth: Payer: Self-pay | Admitting: Cardiology

## 2014-01-20 MED ORDER — METOPROLOL TARTRATE 25 MG PO TABS: 25.0000 mg | ORAL_TABLET | Freq: Two times a day (BID) | ORAL | Status: DC

## 2014-01-20 NOTE — Telephone Encounter (Signed)
Spoke with pt, aware refill has been sent to the pharmacy.

## 2014-01-20 NOTE — Telephone Encounter (Signed)
Pt called in stating that she has been out of her Metoprolol for a while and [facility] has been faxing over refill request. She would like it called in and refilled as soon as possible. Please call  Thanks

## 2014-03-23 ENCOUNTER — Telehealth: Payer: Self-pay | Admitting: Internal Medicine

## 2014-03-23 MED ORDER — VALSARTAN 80 MG PO TABS: 80.0000 mg | ORAL_TABLET | Freq: Every day | ORAL | Status: DC

## 2014-03-23 NOTE — Telephone Encounter (Signed)
Pt called again stating that she had just woken up when she spoke with Mindy and requested to have the message re-iterated.  Advised pt that her Valsartan has been sent to her pharmacy.  Pt voiced her understanding and denied any questions/concerns.

## 2014-03-23 NOTE — Telephone Encounter (Signed)
Called pt and is aware RX sent in. Nothing further needed

## 2014-03-25 ENCOUNTER — Encounter: Payer: Self-pay | Admitting: Cardiology

## 2014-03-27 ENCOUNTER — Telehealth: Payer: Self-pay | Admitting: Cardiology

## 2014-03-27 MED ORDER — NITROGLYCERIN 0.4 MG SL SUBL: 0.4000 mg | SUBLINGUAL_TABLET | SUBLINGUAL | Status: DC | PRN

## 2014-03-27 NOTE — Telephone Encounter (Signed)
Called pt to clarify need, she denies recent chest pain, just wants NTG on-hand in case.  She has appt scheduled w/ Dr. Martinique in March.

## 2014-03-27 NOTE — Telephone Encounter (Signed)
°  1. Which medications need to be refilled? NTG  2. Which pharmacy is medication to be sent to?[facility] in [place]  3. Do they need a 30 day or 90 day supply? 30  4. Would they like a call back once the medication has been sent to the pharmacy? yes

## 2014-04-24 ENCOUNTER — Ambulatory Visit: Payer: Medicare Other | Admitting: Cardiology

## 2014-06-19 ENCOUNTER — Ambulatory Visit (INDEPENDENT_AMBULATORY_CARE_PROVIDER_SITE_OTHER): Payer: Medicare Other | Admitting: Cardiology

## 2014-06-19 ENCOUNTER — Encounter: Payer: Self-pay | Admitting: Cardiology

## 2014-06-19 VITALS — BP 104/56 | HR 68 | Ht 59.0 in | Wt 153.8 lb

## 2014-06-19 DIAGNOSIS — E785 Hyperlipidemia, unspecified: Secondary | ICD-10-CM | POA: Diagnosis not present

## 2014-06-19 DIAGNOSIS — I2510 Atherosclerotic heart disease of native coronary artery without angina pectoris: Secondary | ICD-10-CM

## 2014-06-19 DIAGNOSIS — I10 Essential (primary) hypertension: Secondary | ICD-10-CM | POA: Diagnosis not present

## 2014-06-19 DIAGNOSIS — F17200 Nicotine dependence, unspecified, uncomplicated: Secondary | ICD-10-CM

## 2014-06-19 NOTE — Patient Instructions (Signed)
Increase your exercise  Continue to work on your diet.   You may use nicotine replacement to help stop smoking  I will see you in 6 months

## 2014-06-19 NOTE — Progress Notes (Signed)
[place] Date of Birth: 15-Sep-1945   History of Present Illness: Deanna Johnston is seen today for followup CAD. She is s/p stenting of the mid RCA in Dec 2011 with a DES. She has done  well from a cardiac standpoint. She denies any chest pain. She continues to smoke.  She is trying to quit using nicotine patches. She is doing a good job with her diet- she does much of her own cooking and is eating more vegetables. Needs to exercise more. She is planning on having cataract surgery.  Current Outpatient Prescriptions on File Prior to Visit  Medication Sig Dispense Refill  . aspirin EC 81 MG tablet Take 81 mg by mouth daily.    Marland Kitchen levothyroxine (SYNTHROID, LEVOTHROID) 50 MCG tablet Take 50 mcg by mouth daily before breakfast.    . metoprolol tartrate (LOPRESSOR) 25 MG tablet Take 1 tablet (25 mg total) by mouth 2 (two) times daily. 60 tablet 2  . nitroGLYCERIN (NITROSTAT) 0.4 MG SL tablet Place 1 tablet (0.4 mg total) under the tongue every 5 (five) minutes as needed for chest pain. 25 tablet 0  . valsartan (DIOVAN) 80 MG tablet Take 1 tablet (80 mg total) by mouth daily. 30 tablet 11   No current facility-administered medications on file prior to visit.    Allergies  Allergen Reactions  . Crestor [Rosuvastatin Calcium]     intolerant  . Niaspan [Niacin]     nausea  . Prednisone     "just makes me sick"  . Sulfa Antibiotics Swelling  . Thyroid Hormones     Past Medical History  Diagnosis Date  . Hyperlipidemia   . Coronary artery disease   . MI (myocardial infarction) Dec. 2011    stent mid right coronary  . Tobacco dependence   . Obesity   . Cervical dysplasia   . VAIN (vaginal intraepithelial neoplasia) 2011    Efudex treatment    Past Surgical History  Procedure Laterality Date  . Cardiac catheterization  2011 Dec.    stent mid right coronary,Primus element stent  . Other surgical history      hysterectomy  . Abdominal hysterectomy      TAH BSO  . Oophorectomy       BSO  . Colposcopy    . Anal fistula repair      History  Smoking status  . Current Every Day Smoker -- 0.50 packs/day for 47 years  . Types: Cigarettes  Smokeless tobacco  . Never Used    History  Alcohol Use  . Yes    Comment: Rare    Family History  Problem Relation Age of Onset  . Heart failure Mother   . Stomach cancer Mother   . Heart disease Father   . Diabetes Father   . Colon cancer Sister   . Heart disease Brother     Review of Systems: As noted in history of present illness. All other systems were reviewed and are negative.  Physical Exam: BP 104/56 mmHg  Pulse 68  Ht 4\' 11"  (1.499 m)  Wt 153 lb 12.8 oz (69.763 kg)  BMI 31.05 kg/m2 She is an overweight white female in no acute distress. Her HEENT exam is unremarkable. She has no JVD or bruits. Lungs reveal scattered wheezes. Cardiac exam reveals a regular rate and rhythm without gallop, murmur, or click. Abdomen is soft and nontender. She has no masses or bruits. Extremities are without edema. Pedal pulses are good. She is alert and  oriented x3. Cranial nerves II through XII are intact.  LABORATORY DATA: Lab Results  Component Value Date   WBC 13.4* 02/06/2010   HGB 15.4* 02/06/2010   HCT 46.3* 02/06/2010   PLT 210 02/06/2010   GLUCOSE 93 04/22/2010   CHOL 190 08/26/2010   TRIG 121.0 08/26/2010   HDL 52.50 08/26/2010   LDLCALC 113* 08/26/2010   ALT 17 08/26/2010   AST 25 08/26/2010   NA 138 04/22/2010   K 5.2 04/22/2010   CL 103 04/22/2010   CREATININE 0.83 04/22/2010   BUN 16 04/22/2010   CO2 26 04/22/2010   TSH 4.284 02/04/2010   INR 1.02 02/04/2010   HGBA1C  02/04/2010    5.2 (NOTE)                                                                       According to the ADA Clinical Practice Recommendations for 2011, when HbA1c is used as a screening test:   >=6.5%   Diagnostic of Diabetes Mellitus           (if abnormal result  is confirmed)  5.7-6.4%   Increased risk of developing  Diabetes Mellitus  References:Diagnosis and Classification of Diabetes Mellitus,Diabetes HYWV,3710,62(IRSWN 1):S62-S69 and Standards of Medical Care in         Diabetes - 2011,Diabetes Care,2011,34  (Suppl 1):S11-S61.   Ecg today shows NSR with normal Ecg. I have personally reviewed and interpreted this study.   Assessment / Plan: 1. Coronary disease status post stenting of the mid RCA in December 2011 with a drug-eluting stent. She is without recurrent angina. Continue aspirin, metoprolol. Encouraged increased aerobic activity.  2. Hypertension, controlled.  3. Hyperlipidemia. She is intolerant to multiple medications. I stressed the importance of dietary modifications and weight loss.   4. Tobacco dependence. Again counseled her on smoking cessation. Encouraged her efforts at cessation with nicotine replacement.

## 2014-07-20 ENCOUNTER — Other Ambulatory Visit: Payer: Self-pay | Admitting: *Deleted

## 2014-07-20 MED ORDER — METOPROLOL TARTRATE 25 MG PO TABS: 25.0000 mg | ORAL_TABLET | Freq: Two times a day (BID) | ORAL | Status: DC

## 2014-08-11 ENCOUNTER — Ambulatory Visit (INDEPENDENT_AMBULATORY_CARE_PROVIDER_SITE_OTHER): Payer: Medicare Other | Admitting: Gynecology

## 2014-08-11 ENCOUNTER — Encounter: Payer: Self-pay | Admitting: Gynecology

## 2014-08-11 ENCOUNTER — Other Ambulatory Visit (HOSPITAL_COMMUNITY)
Admission: RE | Admit: 2014-08-11 | Discharge: 2014-08-11 | Disposition: A | Discharge status: Home / Self Care | Payer: Medicare Other | Source: Ambulatory Visit | Attending: Gynecology | Admitting: Gynecology

## 2014-08-11 VITALS — BP 124/70 | Ht <= 58 in | Wt 153.0 lb

## 2014-08-11 DIAGNOSIS — Z124 Encounter for screening for malignant neoplasm of cervix: Secondary | ICD-10-CM | POA: Diagnosis present

## 2014-08-11 DIAGNOSIS — M810 Age-related osteoporosis without current pathological fracture: Secondary | ICD-10-CM | POA: Diagnosis not present

## 2014-08-11 DIAGNOSIS — N952 Postmenopausal atrophic vaginitis: Secondary | ICD-10-CM | POA: Diagnosis not present

## 2014-08-11 DIAGNOSIS — Z01419 Encounter for gynecological examination (general) (routine) without abnormal findings: Secondary | ICD-10-CM

## 2014-08-11 DIAGNOSIS — N893 Dysplasia of vagina, unspecified: Secondary | ICD-10-CM

## 2014-08-11 NOTE — Patient Instructions (Signed)
Schedule your colonoscopy with:  [facility]   Address: [address], [place], [place] [address]  [phone]  Follow up for your bone density as scheduled.  You may obtain a copy of any labs that were done today by logging onto MyChart as outlined in the instructions provided with your AVS (after visit summary). The office will not call with normal lab results but certainly if there are any significant abnormalities then we will contact you.   Health Maintenance, Female A healthy lifestyle and preventative care can promote health and wellness.  Maintain regular health, dental, and eye exams.  Eat a healthy diet. Foods like vegetables, fruits, whole grains, low-fat dairy products, and lean protein foods contain the nutrients you need without too many calories. Decrease your intake of foods high in solid fats, added sugars, and salt. Get information about a proper diet from your caregiver, if necessary.  Regular physical exercise is one of the most important things you can do for your health. Most adults should get at least 150 minutes of moderate-intensity exercise (any activity that increases your heart rate and causes you to sweat) each week. In addition, most adults need muscle-strengthening exercises on 2 or more days a week.   Maintain a healthy weight. The body mass index (BMI) is a screening tool to identify possible weight problems. It provides an estimate of body fat based on height and weight. Your caregiver can help determine your BMI, and can help you achieve or maintain a healthy weight. For adults 20 years and older:  A BMI below 18.5 is considered underweight.  A BMI of 18.5 to 24.9 is normal.  A BMI of 25 to 29.9 is considered overweight.  A BMI of 30 and above is considered obese.  Maintain normal blood lipids and cholesterol by exercising and minimizing your intake of saturated fat. Eat a balanced diet with plenty of fruits and vegetables. Blood  tests for lipids and cholesterol should begin at age 67 and be repeated every 5 years. If your lipid or cholesterol levels are high, you are over 50, or you are a high risk for heart disease, you may need your cholesterol levels checked more frequently.Ongoing high lipid and cholesterol levels should be treated with medicines if diet and exercise are not effective.  If you smoke, find out from your caregiver how to quit. If you do not use tobacco, do not start.  Lung cancer screening is recommended for adults aged 30 80 years who are at high risk for developing lung cancer because of a history of smoking. Yearly low-dose computed tomography (CT) is recommended for people who have at least a 30-pack-year history of smoking and are a current smoker or have quit within the past 15 years. A pack year of smoking is smoking an average of 1 pack of cigarettes a day for 1 year (for example: 1 pack a day for 30 years or 2 packs a day for 15 years). Yearly screening should continue until the smoker has stopped smoking for at least 15 years. Yearly screening should also be stopped for people who develop a health problem that would prevent them from having lung cancer treatment.  If you are pregnant, do not drink alcohol. If you are breastfeeding, be very cautious about drinking alcohol. If you are not pregnant and choose to drink alcohol, do not exceed 1 drink per day. One drink is considered to be 12 ounces (355 mL) of beer, 5 ounces (148 mL) of  wine, or 1.5 ounces (44 mL) of liquor.  Avoid use of street drugs. Do not share needles with anyone. Ask for help if you need support or instructions about stopping the use of drugs.  High blood pressure causes heart disease and increases the risk of stroke. Blood pressure should be checked at least every 1 to 2 years. Ongoing high blood pressure should be treated with medicines, if weight loss and exercise are not effective.  If you are 66 to 71 years old, ask your  caregiver if you should take aspirin to prevent strokes.  Diabetes screening involves taking a blood sample to check your fasting blood sugar level. This should be done once every 3 years, after age 32, if you are within normal weight and without risk factors for diabetes. Testing should be considered at a younger age or be carried out more frequently if you are overweight and have at least 1 risk factor for diabetes.  Breast cancer screening is essential preventative care for women. You should practice "breast self-awareness." This means understanding the normal appearance and feel of your breasts and may include breast self-examination. Any changes detected, no matter how small, should be reported to a caregiver. Women in their 56s and 30s should have a clinical breast exam (CBE) by a caregiver as part of a regular health exam every 1 to 3 years. After age 55, women should have a CBE every year. Starting at age 56, women should consider having a mammogram (breast X-ray) every year. Women who have a family history of breast cancer should talk to their caregiver about genetic screening. Women at a high risk of breast cancer should talk to their caregiver about having an MRI and a mammogram every year.  Breast cancer gene (BRCA)-related cancer risk assessment is recommended for women who have family members with BRCA-related cancers. BRCA-related cancers include breast, ovarian, tubal, and peritoneal cancers. Having family members with these cancers may be associated with an increased risk for harmful changes (mutations) in the breast cancer genes BRCA1 and BRCA2. Results of the assessment will determine the need for genetic counseling and BRCA1 and BRCA2 testing.  The Pap test is a screening test for cervical cancer. Women should have a Pap test starting at age 14. Between ages 55 and 60, Pap tests should be repeated every 2 years. Beginning at age 42, you should have a Pap test every 3 years as long as the  past 3 Pap tests have been normal. If you had a hysterectomy for a problem that was not cancer or a condition that could lead to cancer, then you no longer need Pap tests. If you are between ages 12 and 5, and you have had normal Pap tests going back 10 years, you no longer need Pap tests. If you have had past treatment for cervical cancer or a condition that could lead to cancer, you need Pap tests and screening for cancer for at least 20 years after your treatment. If Pap tests have been discontinued, risk factors (such as a new sexual partner) need to be reassessed to determine if screening should be resumed. Some women have medical problems that increase the chance of getting cervical cancer. In these cases, your caregiver may recommend more frequent screening and Pap tests.  The human papillomavirus (HPV) test is an additional test that may be used for cervical cancer screening. The HPV test looks for the virus that can cause the cell changes on the cervix. The cells collected during  the Pap test can be tested for HPV. The HPV test could be used to screen women aged 49 years and older, and should be used in women of any age who have unclear Pap test results. After the age of 66, women should have HPV testing at the same frequency as a Pap test.  Colorectal cancer can be detected and often prevented. Most routine colorectal cancer screening begins at the age of 26 and continues through age 16. However, your caregiver may recommend screening at an earlier age if you have risk factors for colon cancer. On a yearly basis, your caregiver may provide home test kits to check for hidden blood in the stool. Use of a small camera at the end of a tube, to directly examine the colon (sigmoidoscopy or colonoscopy), can detect the earliest forms of colorectal cancer. Talk to your caregiver about this at age 41, when routine screening begins. Direct examination of the colon should be repeated every 5 to 10 years through  age 22, unless early forms of pre-cancerous polyps or small growths are found.  Hepatitis C blood testing is recommended for all people born from [birth year] through 1967 and any individual with known risks for hepatitis C.  Practice safe sex. Use condoms and avoid high-risk sexual practices to reduce the spread of sexually transmitted infections (STIs). Sexually active women aged 58 and younger should be checked for Chlamydia, which is a common sexually transmitted infection. Older women with new or multiple partners should also be tested for Chlamydia. Testing for other STIs is recommended if you are sexually active and at increased risk.  Osteoporosis is a disease in which the bones lose minerals and strength with aging. This can result in serious bone fractures. The risk of osteoporosis can be identified using a bone density scan. Women ages 25 and over and women at risk for fractures or osteoporosis should discuss screening with their caregivers. Ask your caregiver whether you should be taking a calcium supplement or vitamin D to reduce the rate of osteoporosis.  Menopause can be associated with physical symptoms and risks. Hormone replacement therapy is available to decrease symptoms and risks. You should talk to your caregiver about whether hormone replacement therapy is right for you.  Use sunscreen. Apply sunscreen liberally and repeatedly throughout the day. You should seek shade when your shadow is shorter than you. Protect yourself by wearing long sleeves, pants, a wide-brimmed hat, and sunglasses year round, whenever you are outdoors.  Notify your caregiver of new moles or changes in moles, especially if there is a change in shape or color. Also notify your caregiver if a mole is larger than the size of a pencil eraser.  Stay current with your immunizations. Document Released: 08/15/2010 Document Revised: 05/27/2012 Document Reviewed: 08/15/2010 [facility] Patient Information 2014 [employer].

## 2014-08-11 NOTE — Progress Notes (Signed)
Deanna Johnston 05-30-1945 [id number]        69 y.o.  G0P0 for breast and pelvic exam. Several issues noted below.  Past medical history,surgical history, problem list, medications, allergies, family history and social history were all reviewed and documented as reviewed in the EPIC chart.  ROS:  Performed with pertinent positives and negatives included in the history, assessment and plan.   Additional significant findings :  none   Exam: Deanna Johnston Vitals:   08/11/14 1412  BP: 124/70  Height: 4\' 10"  (1.473 m)  Weight: 153 lb (69.4 kg)   General appearance:  Normal affect, orientation and appearance. Skin: Grossly normal HEENT: Without gross lesions.  No cervical or supraclavicular adenopathy. Thyroid normal.  Lungs:  Clear without wheezing, rales or rhonchi Cardiac: RR, without RMG Abdominal:  Soft, nontender, without masses, guarding, rebound, organomegaly or hernia Breasts:  Examined lying and sitting without masses, retractions, discharge or axillary adenopathy. Pelvic:  Ext/BUS/vagina with atrophic changes. Pap smear of cuff done  Adnexa  Without masses or tenderness    Anus and perineum  Normal   Rectovaginal  Normal sphincter tone without palpated masses or tenderness.    Assessment/Plan:  69 y.o. G0P0 female for breast and pelvic exam.   1. History of high-grade vaginal dysplasia. Status post TAH/BSO 1983. History of vaginal dysplasia dating back to 2001. Was treated with Efudex by Dr. Harrington Challenger 2011 when she had a high-grade dysplastic Pap smear. Follow up Pap smears have been normal.  Pap smear/HPV negative 2014. Pap smear 2015 normal. Pap smear done today. 2. Osteoporosis. DEXA 2011 with T score -3.  We talked about treatment options and she was to start on medication but never followed up for this. She also was to schedule a follow up DEXA but never did so. I again reviewed her substantial risk of fracture given her T score, age, smoking. Patient reluctant to start  any medication understanding the fracture risk. She does agree though to schedule the bone density and an order was placed and she agrees to schedule this. We'll further discuss treatment options following the study. 3. Postmenopausal/atrophic genital changes. Patient without significant symptoms of hot flashes, night sweats or vaginal dryness. Continue to monitor report any issues. 4. Mammography 12/2013. Continue with annual mammography. SBE monthly reviewed. 5. Colonoscopy unsure exactly when. Deanna Johnston that they had a hard time negotiating and she had a barium enema. Was done at [facility]. I've asked her to call them with names and numbers provided and to arrange for follow up as they feel appropriate patient agrees to do so. 6. Stop smoking again discussed and encouraged. Patient not willing to make commitment at this time. 7. Health maintenance. No routine blood work done as patient reports this done at her primary physician's office. Follow up for bone density otherwise annual exam in one year.   Anastasio Auerbach MD, 2:45 PM 08/11/2014

## 2014-08-11 NOTE — Addendum Note (Signed)
Addended by: Nelva Nay on: 08/11/2014 02:52 PM   Modules accepted: Orders

## 2014-08-11 NOTE — Addendum Note (Signed)
Addended by: Joaquin Music on: 08/11/2014 03:54 PM   Modules accepted: Orders

## 2014-08-14 LAB — CYTOLOGY - PAP

## 2014-10-13 ENCOUNTER — Other Ambulatory Visit: Payer: Self-pay

## 2014-10-13 DIAGNOSIS — Z1231 Encounter for screening mammogram for malignant neoplasm of breast: Secondary | ICD-10-CM

## 2014-12-30 ENCOUNTER — Encounter: Payer: Self-pay | Admitting: Cardiology

## 2014-12-30 ENCOUNTER — Ambulatory Visit (INDEPENDENT_AMBULATORY_CARE_PROVIDER_SITE_OTHER): Payer: Medicare Other | Admitting: Cardiology

## 2014-12-30 VITALS — BP 98/64 | HR 76 | Ht 60.0 in | Wt 156.7 lb

## 2014-12-30 DIAGNOSIS — I10 Essential (primary) hypertension: Secondary | ICD-10-CM | POA: Diagnosis not present

## 2014-12-30 DIAGNOSIS — F17200 Nicotine dependence, unspecified, uncomplicated: Secondary | ICD-10-CM | POA: Diagnosis not present

## 2014-12-30 DIAGNOSIS — E785 Hyperlipidemia, unspecified: Secondary | ICD-10-CM

## 2014-12-30 DIAGNOSIS — I2510 Atherosclerotic heart disease of native coronary artery without angina pectoris: Secondary | ICD-10-CM

## 2014-12-30 NOTE — Patient Instructions (Signed)
Continue your current therapy  Proceed with your plans to quit smoking  I will see you in 6 months.

## 2014-12-30 NOTE — Progress Notes (Signed)
[place] Date of Birth: Aug 27, 1945   History of Present Illness: [name] is seen today for followup CAD. She is s/p stenting of the mid RCA in Dec 2011 with a DES. She had residual 70% stenosis in the mid LCx and 90% in the distal LCx.  She has done  well from a cardiac standpoint. She denies any chest pain. She continues to smoke but has cut down to 10 cigs/day.  She is planning to quit using nicotine patches. She is doing a good job with her diet- she does much of her own cooking and is eating more vegetables. She denies any chest pain or SOB. She has lab work scheduled with Deanna Johnston next month.  Current Outpatient Prescriptions on File Prior to Visit  Medication Sig Dispense Refill  . aspirin EC 81 MG tablet Take 81 mg by mouth daily.    Marland Kitchen levothyroxine (SYNTHROID, LEVOTHROID) 50 MCG tablet Take 50 mcg by mouth daily before breakfast.    . metoprolol tartrate (LOPRESSOR) 25 MG tablet Take 1 tablet (25 mg total) by mouth 2 (two) times daily. 60 tablet 5  . nitroGLYCERIN (NITROSTAT) 0.4 MG SL tablet Place 1 tablet (0.4 mg total) under the tongue every 5 (five) minutes as needed for chest pain. 25 tablet 0  . valsartan (DIOVAN) 80 MG tablet Take 1 tablet (80 mg total) by mouth daily. 30 tablet 11   No current facility-administered medications on file prior to visit.    Allergies  Allergen Reactions  . Crestor [Rosuvastatin Calcium]     intolerant  . Niaspan [Niacin]     nausea  . Prednisone     "just makes me sick"  . Sulfa Antibiotics Swelling  . Thyroid Hormones     Past Medical History  Diagnosis Date  . Hyperlipidemia   . Coronary artery disease   . MI (myocardial infarction) [facility]) Dec. 2011    stent mid right coronary  . Tobacco dependence   . Obesity   . Cervical dysplasia   . VAIN (vaginal intraepithelial neoplasia) 2011    Efudex treatment  . Cataract     Past Surgical History  Procedure Laterality Date  . Cardiac catheterization  2011 Dec.   stent mid right coronary,Primus element stent  . Other surgical history      hysterectomy  . Abdominal hysterectomy      TAH BSO  . Oophorectomy      BSO  . Colposcopy    . Anal fistula repair    . Cataract extraction      History  Smoking status  . Current Every Day Smoker -- 0.50 packs/day for 47 years  . Types: Cigarettes  Smokeless tobacco  . Never Used    History  Alcohol Use  . 0.0 oz/week  . 0 Standard drinks or equivalent per week    Comment: Rare    Family History  Problem Relation Age of Onset  . Heart failure Mother   . Stomach cancer Mother   . Heart disease Father   . Diabetes Father   . Colon cancer Sister   . Heart disease Brother     Review of Systems: As noted in history of present illness. All other systems were reviewed and are negative.  Physical Exam: BP 98/64 mmHg  Pulse 76  Ht 5' (1.524 m)  Wt 71.079 kg (156 lb 11.2 oz)  BMI 30.60 kg/m2  SpO2 94% She is an overweight white female in no acute distress. Her  HEENT exam is unremarkable. She has no JVD or bruits. Lungs reveal Coarse BS bilaterally. Cardiac exam reveals a regular rate and rhythm without gallop, murmur, or click. Abdomen is soft and nontender. She has no masses or bruits. Extremities are without edema. Pedal pulses are good. She is alert and oriented x3. Cranial nerves II through XII are intact.  LABORATORY DATA: Lab Results  Component Value Date   WBC 13.4* 02/06/2010   HGB 15.4* 02/06/2010   HCT 46.3* 02/06/2010   PLT 210 02/06/2010   GLUCOSE 93 04/22/2010   CHOL 190 08/26/2010   TRIG 121.0 08/26/2010   HDL 52.50 08/26/2010   LDLCALC 113* 08/26/2010   ALT 17 08/26/2010   AST 25 08/26/2010   NA 138 04/22/2010   K 5.2 04/22/2010   CL 103 04/22/2010   CREATININE 0.83 04/22/2010   BUN 16 04/22/2010   CO2 26 04/22/2010   TSH 4.284 02/04/2010   INR 1.02 02/04/2010   HGBA1C  02/04/2010    5.2 (NOTE)                                                                        According to the ADA Clinical Practice Recommendations for 2011, when HbA1c is used as a screening test:   >=6.5%   Diagnostic of Diabetes Mellitus           (if abnormal result  is confirmed)  5.7-6.4%   Increased risk of developing Diabetes Mellitus  References:Diagnosis and Classification of Diabetes Mellitus,Diabetes [id number] 1):S62-S69 and Standards of Medical Care in         Diabetes - 2011,Diabetes Care,2011,34  (Suppl 1):S11-S61.      Assessment / Plan: 1. Coronary disease status post stenting of the mid RCA in December 2011 with a drug-eluting stent. She is without recurrent angina. Continue aspirin, metoprolol. Encouraged increased aerobic activity.  2. Hypertension, controlled.  3. Hyperlipidemia. She is intolerant to multiple medications. I stressed the importance of dietary modifications and weight loss.   4. Tobacco dependence. Again counseled her on smoking cessation. Encouraged her efforts at cessation with nicotine replacement.

## 2015-01-12 ENCOUNTER — Ambulatory Visit
Admission: RE | Admit: 2015-01-12 | Discharge: 2015-01-12 | Disposition: A | Discharge status: Home / Self Care | Payer: Medicare Other | Source: Ambulatory Visit

## 2015-01-12 DIAGNOSIS — Z1231 Encounter for screening mammogram for malignant neoplasm of breast: Secondary | ICD-10-CM

## 2015-03-22 ENCOUNTER — Other Ambulatory Visit: Payer: Self-pay | Admitting: Internal Medicine

## 2015-03-22 ENCOUNTER — Other Ambulatory Visit: Payer: Self-pay | Admitting: Cardiology

## 2015-03-22 NOTE — Telephone Encounter (Signed)
Rx request sent to pharmacy.  

## 2015-03-26 ENCOUNTER — Other Ambulatory Visit: Payer: Self-pay | Admitting: Internal Medicine

## 2015-03-29 ENCOUNTER — Telehealth: Payer: Self-pay

## 2015-03-29 ENCOUNTER — Other Ambulatory Visit: Payer: Self-pay | Admitting: *Deleted

## 2015-03-29 MED ORDER — VALSARTAN 80 MG PO TABS: 80.0000 mg | ORAL_TABLET | Freq: Every day | ORAL | Status: DC

## 2015-03-29 NOTE — Telephone Encounter (Signed)
Received a call from patient.Valsartan refill sent to pharmacy.

## 2015-07-01 ENCOUNTER — Ambulatory Visit: Payer: Medicare Other | Admitting: Cardiology

## 2015-07-26 ENCOUNTER — Other Ambulatory Visit: Payer: Self-pay | Admitting: Cardiology

## 2015-07-26 NOTE — Telephone Encounter (Signed)
Rx request sent to pharmacy.  

## 2015-07-28 ENCOUNTER — Encounter: Payer: Self-pay | Admitting: Cardiology

## 2015-08-12 ENCOUNTER — Encounter: Payer: Self-pay | Admitting: Gynecology

## 2015-08-12 ENCOUNTER — Ambulatory Visit (INDEPENDENT_AMBULATORY_CARE_PROVIDER_SITE_OTHER): Payer: Medicare Other | Admitting: Gynecology

## 2015-08-12 VITALS — BP 124/84 | Ht <= 58 in | Wt 157.0 lb

## 2015-08-12 DIAGNOSIS — R21 Rash and other nonspecific skin eruption: Secondary | ICD-10-CM | POA: Diagnosis not present

## 2015-08-12 DIAGNOSIS — N952 Postmenopausal atrophic vaginitis: Secondary | ICD-10-CM

## 2015-08-12 DIAGNOSIS — N893 Dysplasia of vagina, unspecified: Secondary | ICD-10-CM

## 2015-08-12 DIAGNOSIS — Z01419 Encounter for gynecological examination (general) (routine) without abnormal findings: Secondary | ICD-10-CM | POA: Diagnosis not present

## 2015-08-12 DIAGNOSIS — M810 Age-related osteoporosis without current pathological fracture: Secondary | ICD-10-CM | POA: Diagnosis not present

## 2015-08-12 MED ORDER — NYSTATIN 100000 UNIT/GM EX CREA: 1.0000 "application " | TOPICAL_CREAM | Freq: Two times a day (BID) | CUTANEOUS | Status: DC

## 2015-08-12 NOTE — Patient Instructions (Signed)
Use the antifungal cream on the areas with a rash as needed twice daily.  Call the office if you change your mind about scheduling the bone density or taking medication for your osteoporosis.  Follow up with Rosine in reference to your: To see if there is any further studies that they would like to do.

## 2015-08-12 NOTE — Progress Notes (Signed)
Deanna Johnston [birth year] [id number]        70 y.o.  G0P0  for breast and pelvic exam. Several issues noted below.  Past medical history,surgical history, problem list, medications, allergies, family history and social history were all reviewed and documented as reviewed in the EPIC chart.  ROS:  Performed with pertinent positives and negatives included in the history, assessment and plan.   Additional significant findings :  Skin rash   Exam: Caryn Bee assistant Filed Vitals:   08/12/15 0918  BP: 124/84  Height: 4\' 10"  (1.473 m)  Weight: 157 lb (71.215 kg)   General appearance:  Normal affect, orientation and appearance. Skin: Grossly normal excepting fungal type rash in her panniculus fold and thigh folds. HEENT: Without gross lesions.  No cervical or supraclavicular adenopathy. Thyroid normal.  Lungs:  Clear without wheezing, rales or rhonchi Cardiac: RR, without RMG Abdominal:  Soft, nontender, without masses, guarding, rebound, organomegaly or hernia Breasts:  Examined lying and sitting without masses, retractions, discharge or axillary adenopathy. Pelvic:  Ext/BUS/vagina with atrophic changes. Pap smear of vaginal cuff done  Adnexa without masses or tenderness    Anus and perineum normal   Rectovaginal normal sphincter tone without palpated masses or tenderness.    Assessment/Plan:  70 y.o. G0P0 female for breast and pelvic exam.  1. Postmenopausal/atrophic genital changes. Without significant hot flushes, night sweats, vaginal dryness. Continue to monitor report any issues. 2. Skin rash in her panniculus fold and thighs consistent with fungal. Reviewed this with her and recommend Mycostatin cream twice daily as needed. 30 g prescribed with 3 refills. Follow up if persists or worsens. 3. Osteoporosis. DEXA 2011 T score -3. I had an extensive discussion with her today about her increased risk of fracture and potential for devastating outcomes. Treatment options  reviewed to include bisphosphonates weekly, monthly and annual IV injection. Prolia option also reviewed. Risks benefits discussed as well as the issues with her smoking contribute into this. After a lengthy discussion and the patient clearly understanding the issues and risks she declines treatment or declines any further studies stating I would never take medicine regardless of the bone density results. 4. Mammography 12/2014. Continue with annual mammography when due. SBE monthly reviewed. 5. Colonoscopy never completed. They apparently had issues negotiating her colon.  She had a follow up barium enema. I'm asked her previously and again I have asked her to schedule an appointment with [facility] gastroenterology in follow up as far as their choice of: Screening. She now just my recommendations but does not commit to following up with this. 6. History of VAIN.  Status post TAH/BSO 1983. 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 Has undergone multiple colposcopies and followup. Ultimately he had high-grade dysplasia 2011 and was treated with Efudex cream by Dr. Harrington Challenger. Her followup Pap smears have been normal with reported 2013 Pap smear normal although I do not have a copy of this. Pap smear/HPV -2014, Pap smears 2015/2016 negative. Pap smear done today. 7. Stop smoking again discussed and strongly encouraged. I reviewed the risks to include her osteoporosis and general health risks. Patient states "it is on my list" 8. Health maintenance. No routine lab work done as this is done elsewhere. Follow up 1 year, sooner as needed.  Greater than 15 minutes of my time in excess of her breast and pelvic exam was spent in direct face to face counseling and coordination of care in regards to her  problems of skin rash, osteoporosis, stop smoking.   Anastasio Auerbach MD, 10:18 AM 08/12/2015

## 2015-08-12 NOTE — Addendum Note (Signed)
Addended by: Nelva Nay on: 08/12/2015 10:39 AM   Modules accepted: Orders

## 2015-08-13 LAB — PAP IG W/ RFLX HPV ASCU

## 2015-11-02 ENCOUNTER — Other Ambulatory Visit: Payer: Self-pay | Admitting: Family Medicine

## 2015-11-02 DIAGNOSIS — Z1231 Encounter for screening mammogram for malignant neoplasm of breast: Secondary | ICD-10-CM

## 2015-11-03 ENCOUNTER — Other Ambulatory Visit: Payer: Self-pay | Admitting: Cardiology

## 2015-11-04 NOTE — Telephone Encounter (Signed)
Rx(s) sent to pharmacy electronically.  

## 2015-11-07 NOTE — Progress Notes (Signed)
[place] Date of Birth: Aug 08, 1945   History of Present Illness: Deanna Johnston is seen today for followup CAD. She is s/p stenting of the mid RCA in Dec 2011 with a DES. She had residual 70% stenosis in the mid LCx and 90% in the distal LCx.  She has done  well from a cardiac standpoint. She denies any chest pain. She continues to smoke.  She is doing a good job with her diet- she does much of her own cooking and is eating more vegetables. Eating more fruit now. Complains Synthroid causes her hair to fall out and she gains weight so she only takes it when going to see her primary. Developed significant swelling when on doxycycline for a sinus infection.  Current Outpatient Prescriptions on File Prior to Visit  Medication Sig Dispense Refill  . aspirin EC 81 MG tablet Take 81 mg by mouth daily.    Marland Kitchen levothyroxine (SYNTHROID, LEVOTHROID) 50 MCG tablet Take 50 mcg by mouth daily before breakfast.    . metoprolol tartrate (LOPRESSOR) 25 MG tablet TAKE ONE TABLET BY MOUTH TWICE DAILY 60 tablet 3  . NITROSTAT 0.4 MG SL tablet PLACE ONE TABLET UNDER THE TONGUE EVERY 5 (FIVE) MINUTES AS NEEDED FOR CHEST PAIN 25 tablet 4  . valsartan (DIOVAN) 80 MG tablet TAKE ONE TABLET BY MOUTH ONCE DAILY. 30 tablet 6   No current facility-administered medications on file prior to visit.     Allergies  Allergen Reactions  . Crestor [Rosuvastatin Calcium]     intolerant  . Prednisone     "just makes me sick"  . Sulfa Antibiotics Swelling  . Thyroid Hormones   . Niaspan [Niacin] Nausea Only    nausea    Past Medical History:  Diagnosis Date  . Cataract   . Cervical dysplasia   . Coronary artery disease   . Hyperlipidemia   . MI (myocardial infarction) [facility]) Dec. 2011   stent mid right coronary  . Obesity   . Tobacco dependence   . VAIN (vaginal intraepithelial neoplasia) 2011   Efudex treatment    Past Surgical History:  Procedure Laterality Date  . ABDOMINAL HYSTERECTOMY     TAH BSO  .  anal fistula repair    . CARDIAC CATHETERIZATION  2011 Dec.   stent mid right coronary,Primus element stent  . CATARACT EXTRACTION    . COLPOSCOPY    . OOPHORECTOMY     BSO  . OTHER SURGICAL HISTORY     hysterectomy    History  Smoking Status  . Current Every Day Smoker  . Packs/day: 0.50  . Years: 47.00  . Types: Cigarettes  Smokeless Tobacco  . Never Used    History  Alcohol Use  . 0.0 oz/week    Comment: Rare    Family History  Problem Relation Age of Onset  . Heart failure Mother   . Stomach cancer Mother   . Heart disease Father   . Diabetes Father   . Colon cancer Sister   . Heart disease Brother     Review of Systems: As noted in history of present illness. All other systems were reviewed and are negative.  Physical Exam: BP 104/60 (BP Location: Right Arm, Patient Position: Sitting, Cuff Size: Normal)   Pulse 70   Ht 4\' 10"  (1.473 m)   Wt 163 lb 12.8 oz (74.3 kg)   SpO2 90%   BMI 34.23 kg/m  She is an overweight white female in no acute distress.  Her HEENT exam is unremarkable. She has no JVD or bruits. Lungs reveal Coarse BS bilaterally. Cardiac exam reveals a regular rate and rhythm without gallop, murmur, or click. Abdomen is soft and nontender. She has no masses or bruits. Extremities are without edema. Pedal pulses are good. She is alert and oriented x3. Cranial nerves II through XII are intact.  LABORATORY DATA: Lab Results  Component Value Date   WBC 13.4 (H) 02/06/2010   HGB 15.4 (H) 02/06/2010   HCT 46.3 (H) 02/06/2010   PLT 210 02/06/2010   GLUCOSE 93 04/22/2010   CHOL 190 08/26/2010   TRIG 121.0 08/26/2010   HDL 52.50 08/26/2010   LDLCALC 113 (H) 08/26/2010   ALT 17 08/26/2010   AST 25 08/26/2010   NA 138 04/22/2010   K 5.2 04/22/2010   CL 103 04/22/2010   CREATININE 0.83 04/22/2010   BUN 16 04/22/2010   CO2 26 04/22/2010   TSH 4.284 02/04/2010   INR 1.02 02/04/2010   HGBA1C  02/04/2010    5.2 (NOTE)                                                                        According to the ADA Clinical Practice Recommendations for 2011, when HbA1c is used as a screening test:   >=6.5%   Diagnostic of Diabetes Mellitus           (if abnormal result  is confirmed)  5.7-6.4%   Increased risk of developing Diabetes Mellitus  References:Diagnosis and Classification of Diabetes Mellitus,Diabetes [id number] 1):S62-S69 and Standards of Medical Care in         Diabetes - 2011,Diabetes Care,2011,34  (Suppl 1):S11-S61.    Labs reviewed from primary care dated 07/06/15: cholesterol 131, triglycerides 70, LDL 70, HDL 47. A1c 5.2%. CMET normal.  Ecg today shows NSR with LAD. Otherwise normal. I have personally reviewed and interpreted this study.   Assessment / Plan: 1. Coronary disease status post stenting of the mid RCA in December 2011 with a drug-eluting stent. She is without recurrent angina. Continue aspirin, metoprolol. Encouraged increased aerobic activity.  2. Hypertension, controlled.  3. Hyperlipidemia. She is intolerant to multiple medications. Lipids look good on last lab work.  I stressed the importance of dietary modifications and weight loss.   4. Tobacco dependence. Again counseled her on smoking cessation. Encouraged her efforts at cessation with nicotine replacement.   Follow up 6-8 months.

## 2015-11-08 ENCOUNTER — Encounter: Payer: Self-pay | Admitting: Cardiology

## 2015-11-08 ENCOUNTER — Ambulatory Visit (INDEPENDENT_AMBULATORY_CARE_PROVIDER_SITE_OTHER): Payer: Medicare Other | Admitting: Cardiology

## 2015-11-08 VITALS — BP 104/60 | HR 70 | Ht <= 58 in | Wt 163.8 lb

## 2015-11-08 DIAGNOSIS — I10 Essential (primary) hypertension: Secondary | ICD-10-CM

## 2015-11-08 DIAGNOSIS — E785 Hyperlipidemia, unspecified: Secondary | ICD-10-CM

## 2015-11-08 DIAGNOSIS — F17200 Nicotine dependence, unspecified, uncomplicated: Secondary | ICD-10-CM | POA: Diagnosis not present

## 2015-11-08 DIAGNOSIS — I2510 Atherosclerotic heart disease of native coronary artery without angina pectoris: Secondary | ICD-10-CM

## 2015-11-08 NOTE — Patient Instructions (Addendum)
Continue your current therapy  I will see you in 6-8 months. 

## 2016-01-13 ENCOUNTER — Ambulatory Visit
Admission: RE | Admit: 2016-01-13 | Discharge: 2016-01-13 | Disposition: A | Discharge status: Home / Self Care | Payer: Medicare Other | Source: Ambulatory Visit | Attending: Family Medicine | Admitting: Family Medicine

## 2016-01-13 DIAGNOSIS — Z1231 Encounter for screening mammogram for malignant neoplasm of breast: Secondary | ICD-10-CM

## 2016-04-12 ENCOUNTER — Other Ambulatory Visit: Payer: Self-pay | Admitting: Cardiology

## 2016-05-04 NOTE — Progress Notes (Signed)
[place] Date of Birth: [birth year]   History of Present Illness: Deanna Johnston is seen today for followup CAD. She is s/p stenting of the mid RCA in Dec 2011 with a DES. She had residual 70% stenosis in the mid LCx and 90% in the distal LCx.  She has done  well from a cardiac standpoint.   She denies any chest pain. She continues to smoke.  She tried quitting with nicotine patch but gained one lb per week and therefore resumed smoking. Now 1/2 pk/day.  She states she has been eating unhealthy and this is reflected in her cholesterol levels. She is back cooking more and making better dietary choices.  Complains Synthroid causes her hair to fall out and she gains weight. She has stopped this as well.  She has a tickle in her throat and dry cough. Her activity is limited by chronic back pain.  Current Outpatient Prescriptions on File Prior to Visit  Medication Sig Dispense Refill  . aspirin EC 81 MG tablet Take 81 mg by mouth daily.    Marland Kitchen levothyroxine (SYNTHROID, LEVOTHROID) 50 MCG tablet Take 50 mcg by mouth daily before breakfast.    . metoprolol tartrate (LOPRESSOR) 25 MG tablet TAKE ONE TABLET BY MOUTH TWICE DAILY 180 tablet 1  . nitroGLYCERIN (NITROSTAT) 0.4 MG SL tablet PLACE 1 TABLET UNDER THE TONGUE EVERY 5 MINUTES AS NEEDED FOR CHEST PAIN 75 tablet 0  . valsartan (DIOVAN) 80 MG tablet TAKE ONE TABLET BY MOUTH ONCE DAILY. 30 tablet 6   No current facility-administered medications on file prior to visit.     Allergies  Allergen Reactions  . Crestor [Rosuvastatin Calcium]     intolerant  . Prednisone     "just makes me sick"  . Sulfa Antibiotics Swelling  . Thyroid Hormones   . Niaspan [Niacin] Nausea Only    nausea    Past Medical History:  Diagnosis Date  . Cataract   . Cervical dysplasia   . Coronary artery disease   . Hyperlipidemia   . MI (myocardial infarction) Dec. 2011   stent mid right coronary  . Obesity   . Tobacco dependence   . VAIN (vaginal  intraepithelial neoplasia) 2011   Efudex treatment    Past Surgical History:  Procedure Laterality Date  . ABDOMINAL HYSTERECTOMY     TAH BSO  . anal fistula repair    . CARDIAC CATHETERIZATION  2011 Dec.   stent mid right coronary,Primus element stent  . CATARACT EXTRACTION    . COLPOSCOPY    . OOPHORECTOMY     BSO  . OTHER SURGICAL HISTORY     hysterectomy    History  Smoking Status  . Current Every Day Smoker  . Packs/day: 0.50  . Years: 47.00  . Types: Cigarettes  Smokeless Tobacco  . Never Used    History  Alcohol Use  . 0.0 oz/week    Comment: Rare    Family History  Problem Relation Age of Onset  . Heart failure Mother   . Stomach cancer Mother   . Heart disease Father   . Diabetes Father   . Colon cancer Sister   . Heart disease Brother     Review of Systems: As noted in history of present illness. All other systems were reviewed and are negative.  Physical Exam: BP (!) 124/58   Pulse 67   Ht 4\' 11"  (1.499 m)   Wt 163 lb (73.9 kg)   BMI 32.92 kg/m  She is an overweight white female in no acute distress. Her HEENT exam is unremarkable. She has no JVD or bruits. Lungs reveal Coarse BS bilaterally. Cardiac exam reveals a regular rate and rhythm without gallop, murmur, or click. Abdomen is soft and nontender. She has no masses or bruits. Extremities are without edema. Pedal pulses are good. She is alert and oriented x3. Cranial nerves II through XII are intact.  LABORATORY DATA: Lab Results  Component Value Date   WBC 13.4 (H) 02/06/2010   HGB 15.4 (H) 02/06/2010   HCT 46.3 (H) 02/06/2010   PLT 210 02/06/2010   GLUCOSE 93 04/22/2010   CHOL 190 08/26/2010   TRIG 121.0 08/26/2010   HDL 52.50 08/26/2010   LDLCALC 113 (H) 08/26/2010   ALT 17 08/26/2010   AST 25 08/26/2010   NA 138 04/22/2010   K 5.2 04/22/2010   CL 103 04/22/2010   CREATININE 0.83 04/22/2010   BUN 16 04/22/2010   CO2 26 04/22/2010   TSH 4.284 02/04/2010   INR 1.02  02/04/2010   HGBA1C  02/04/2010    5.2 (NOTE)                                                                       According to the ADA Clinical Practice Recommendations for 2011, when HbA1c is used as a screening test:   >=6.5%   Diagnostic of Diabetes Mellitus           (if abnormal result  is confirmed)  5.7-6.4%   Increased risk of developing Diabetes Mellitus  References:Diagnosis and Classification of Diabetes Mellitus,Diabetes PIRJ,1884,16(SAYTK 1):S62-S69 and Standards of Medical Care in         Diabetes - 2011,Diabetes Care,2011,34  (Suppl 1):S11-S61.    Labs reviewed from primary care dated 01/12/16: cholesterol 177, triglycerides 93, LDL 103, HDL 56.  CMET normal.   Assessment / Plan: 1. Coronary disease status post stenting of the mid RCA in December 2011 with a drug-eluting stent. She is without recurrent angina. Continue aspirin, metoprolol.   2. Hypertension, controlled.  3. Hyperlipidemia. She is intolerant to multiple medications.   I stressed the importance of dietary modifications and weight loss.   4. Tobacco dependence. Again counseled her on smoking cessation. Encouraged her efforts at cessation and told her to try again.   Follow up 6 months.

## 2016-05-05 ENCOUNTER — Encounter: Payer: Self-pay | Admitting: Cardiology

## 2016-05-05 ENCOUNTER — Ambulatory Visit (INDEPENDENT_AMBULATORY_CARE_PROVIDER_SITE_OTHER): Payer: Medicare Other | Admitting: Cardiology

## 2016-05-05 VITALS — BP 124/58 | HR 67 | Ht 59.0 in | Wt 163.0 lb

## 2016-05-05 DIAGNOSIS — F17200 Nicotine dependence, unspecified, uncomplicated: Secondary | ICD-10-CM | POA: Diagnosis not present

## 2016-05-05 DIAGNOSIS — E7800 Pure hypercholesterolemia, unspecified: Secondary | ICD-10-CM

## 2016-05-05 DIAGNOSIS — I10 Essential (primary) hypertension: Secondary | ICD-10-CM | POA: Diagnosis not present

## 2016-05-05 DIAGNOSIS — I2510 Atherosclerotic heart disease of native coronary artery without angina pectoris: Secondary | ICD-10-CM | POA: Diagnosis not present

## 2016-05-05 NOTE — Patient Instructions (Signed)
Keep working on a healthy diet.  Continue your current therapy  I will see you in 6 months.

## 2016-06-19 ENCOUNTER — Other Ambulatory Visit: Payer: Self-pay | Admitting: Cardiology

## 2016-07-20 ENCOUNTER — Encounter (HOSPITAL_COMMUNITY): Payer: Self-pay

## 2016-07-20 ENCOUNTER — Emergency Department (HOSPITAL_COMMUNITY): Payer: Medicare Other

## 2016-07-20 ENCOUNTER — Inpatient Hospital Stay (HOSPITAL_COMMUNITY)
Admission: EM | Admit: 2016-07-20 | Discharge: 2016-07-29 | DRG: 286 | Disposition: A | Discharge status: Home / Self Care | Payer: Medicare Other | Attending: Internal Medicine | Admitting: Internal Medicine

## 2016-07-20 DIAGNOSIS — R0902 Hypoxemia: Secondary | ICD-10-CM | POA: Diagnosis present

## 2016-07-20 DIAGNOSIS — R739 Hyperglycemia, unspecified: Secondary | ICD-10-CM | POA: Diagnosis present

## 2016-07-20 DIAGNOSIS — I509 Heart failure, unspecified: Secondary | ICD-10-CM | POA: Diagnosis not present

## 2016-07-20 DIAGNOSIS — I248 Other forms of acute ischemic heart disease: Secondary | ICD-10-CM | POA: Diagnosis present

## 2016-07-20 DIAGNOSIS — J9602 Acute respiratory failure with hypercapnia: Secondary | ICD-10-CM

## 2016-07-20 DIAGNOSIS — I2510 Atherosclerotic heart disease of native coronary artery without angina pectoris: Secondary | ICD-10-CM | POA: Diagnosis present

## 2016-07-20 DIAGNOSIS — I252 Old myocardial infarction: Secondary | ICD-10-CM | POA: Diagnosis not present

## 2016-07-20 DIAGNOSIS — N179 Acute kidney failure, unspecified: Secondary | ICD-10-CM | POA: Diagnosis present

## 2016-07-20 DIAGNOSIS — I50810 Right heart failure, unspecified: Secondary | ICD-10-CM | POA: Diagnosis present

## 2016-07-20 DIAGNOSIS — I10 Essential (primary) hypertension: Secondary | ICD-10-CM | POA: Diagnosis not present

## 2016-07-20 DIAGNOSIS — Z955 Presence of coronary angioplasty implant and graft: Secondary | ICD-10-CM | POA: Diagnosis not present

## 2016-07-20 DIAGNOSIS — E871 Hypo-osmolality and hyponatremia: Secondary | ICD-10-CM | POA: Diagnosis present

## 2016-07-20 DIAGNOSIS — I2781 Cor pulmonale (chronic): Secondary | ICD-10-CM | POA: Diagnosis present

## 2016-07-20 DIAGNOSIS — D751 Secondary polycythemia: Secondary | ICD-10-CM | POA: Diagnosis present

## 2016-07-20 DIAGNOSIS — R0600 Dyspnea, unspecified: Secondary | ICD-10-CM

## 2016-07-20 DIAGNOSIS — G8929 Other chronic pain: Secondary | ICD-10-CM | POA: Diagnosis present

## 2016-07-20 DIAGNOSIS — G4733 Obstructive sleep apnea (adult) (pediatric): Secondary | ICD-10-CM | POA: Diagnosis present

## 2016-07-20 DIAGNOSIS — J9690 Respiratory failure, unspecified, unspecified whether with hypoxia or hypercapnia: Secondary | ICD-10-CM

## 2016-07-20 DIAGNOSIS — E7800 Pure hypercholesterolemia, unspecified: Secondary | ICD-10-CM | POA: Diagnosis not present

## 2016-07-20 DIAGNOSIS — F17210 Nicotine dependence, cigarettes, uncomplicated: Secondary | ICD-10-CM | POA: Diagnosis present

## 2016-07-20 DIAGNOSIS — L8990 Pressure ulcer of unspecified site, unspecified stage: Secondary | ICD-10-CM | POA: Insufficient documentation

## 2016-07-20 DIAGNOSIS — N183 Chronic kidney disease, stage 3 (moderate): Secondary | ICD-10-CM | POA: Diagnosis present

## 2016-07-20 DIAGNOSIS — J9601 Acute respiratory failure with hypoxia: Secondary | ICD-10-CM | POA: Diagnosis not present

## 2016-07-20 DIAGNOSIS — I50813 Acute on chronic right heart failure: Secondary | ICD-10-CM | POA: Diagnosis not present

## 2016-07-20 DIAGNOSIS — Z7982 Long term (current) use of aspirin: Secondary | ICD-10-CM

## 2016-07-20 DIAGNOSIS — I130 Hypertensive heart and chronic kidney disease with heart failure and stage 1 through stage 4 chronic kidney disease, or unspecified chronic kidney disease: Principal | ICD-10-CM | POA: Diagnosis present

## 2016-07-20 DIAGNOSIS — E785 Hyperlipidemia, unspecified: Secondary | ICD-10-CM | POA: Diagnosis present

## 2016-07-20 DIAGNOSIS — I5033 Acute on chronic diastolic (congestive) heart failure: Secondary | ICD-10-CM | POA: Diagnosis not present

## 2016-07-20 DIAGNOSIS — J9622 Acute and chronic respiratory failure with hypercapnia: Secondary | ICD-10-CM | POA: Diagnosis present

## 2016-07-20 DIAGNOSIS — I2721 Secondary pulmonary arterial hypertension: Secondary | ICD-10-CM | POA: Diagnosis present

## 2016-07-20 DIAGNOSIS — F17200 Nicotine dependence, unspecified, uncomplicated: Secondary | ICD-10-CM | POA: Diagnosis present

## 2016-07-20 DIAGNOSIS — J449 Chronic obstructive pulmonary disease, unspecified: Secondary | ICD-10-CM

## 2016-07-20 DIAGNOSIS — R570 Cardiogenic shock: Secondary | ICD-10-CM | POA: Diagnosis not present

## 2016-07-20 DIAGNOSIS — J9621 Acute and chronic respiratory failure with hypoxia: Secondary | ICD-10-CM | POA: Diagnosis present

## 2016-07-20 DIAGNOSIS — G253 Myoclonus: Secondary | ICD-10-CM | POA: Diagnosis present

## 2016-07-20 DIAGNOSIS — J9600 Acute respiratory failure, unspecified whether with hypoxia or hypercapnia: Secondary | ICD-10-CM

## 2016-07-20 DIAGNOSIS — I25119 Atherosclerotic heart disease of native coronary artery with unspecified angina pectoris: Secondary | ICD-10-CM | POA: Diagnosis not present

## 2016-07-20 DIAGNOSIS — I5023 Acute on chronic systolic (congestive) heart failure: Secondary | ICD-10-CM | POA: Diagnosis present

## 2016-07-20 DIAGNOSIS — J441 Chronic obstructive pulmonary disease with (acute) exacerbation: Secondary | ICD-10-CM | POA: Diagnosis present

## 2016-07-20 DIAGNOSIS — I2609 Other pulmonary embolism with acute cor pulmonale: Secondary | ICD-10-CM | POA: Diagnosis not present

## 2016-07-20 DIAGNOSIS — I959 Hypotension, unspecified: Secondary | ICD-10-CM

## 2016-07-20 DIAGNOSIS — I50811 Acute right heart failure: Secondary | ICD-10-CM | POA: Diagnosis not present

## 2016-07-20 HISTORY — DX: Chronic obstructive pulmonary disease, unspecified: J44.9

## 2016-07-20 LAB — BASIC METABOLIC PANEL
ANION GAP: 9 (ref 5–15)
BUN: 75 mg/dL — ABNORMAL HIGH (ref 6–20)
CO2: 25 mmol/L (ref 22–32)
Calcium: 8.5 mg/dL — ABNORMAL LOW (ref 8.9–10.3)
Chloride: 100 mmol/L — ABNORMAL LOW (ref 101–111)
Creatinine, Ser: 1.79 mg/dL — ABNORMAL HIGH (ref 0.44–1.00)
GFR, EST AFRICAN AMERICAN: 31 mL/min — AB (ref >60)
GFR, EST NON AFRICAN AMERICAN: 27 mL/min — AB (ref >60)
Glucose, Bld: 103 mg/dL — ABNORMAL HIGH (ref 65–99)
POTASSIUM: 5.2 mmol/L — AB (ref 3.5–5.1)
SODIUM: 134 mmol/L — AB (ref 135–145)

## 2016-07-20 LAB — I-STAT TROPONIN, ED: TROPONIN I, POC: 0.06 ng/mL (ref 0.00–0.08)

## 2016-07-20 LAB — I-STAT ARTERIAL BLOOD GAS, ED
ACID-BASE EXCESS: 2 mmol/L (ref 0.0–2.0)
Bicarbonate: 30.4 mmol/L — ABNORMAL HIGH (ref 20.0–28.0)
O2 SAT: 90 %
PH ART: 7.309 — AB (ref 7.350–7.450)
PO2 ART: 63 mmHg — AB (ref 83.0–108.0)
TCO2: 32 mmol/L (ref 0–100)
pCO2 arterial: 60.2 mmHg — ABNORMAL HIGH (ref 32.0–48.0)

## 2016-07-20 LAB — CBC
HCT: 54.7 % — ABNORMAL HIGH (ref 36.0–46.0)
HEMOGLOBIN: 17.2 g/dL — AB (ref 12.0–15.0)
MCH: 29.5 pg (ref 26.0–34.0)
MCHC: 31.4 g/dL (ref 30.0–36.0)
MCV: 93.8 fL (ref 78.0–100.0)
Platelets: 239 10*3/uL (ref 150–400)
RBC: 5.83 MIL/uL — AB (ref 3.87–5.11)
RDW: 15.2 % (ref 11.5–15.5)
WBC: 11.6 10*3/uL — AB (ref 4.0–10.5)

## 2016-07-20 LAB — BRAIN NATRIURETIC PEPTIDE: B NATRIURETIC PEPTIDE 5: 1113.9 pg/mL — AB (ref 0.0–100.0)

## 2016-07-20 LAB — I-STAT CG4 LACTIC ACID, ED: Lactic Acid, Venous: 1.45 mmol/L (ref 0.5–1.9)

## 2016-07-20 MED ORDER — SODIUM CHLORIDE 0.9 % IV BOLUS (SEPSIS)
250.0000 mL | Freq: Once | INTRAVENOUS | Status: AC
  Administered 2016-07-20: 250 mL via INTRAVENOUS

## 2016-07-20 MED ORDER — DEXTROSE 5 % IV SOLN
1.0000 g | Freq: Once | INTRAVENOUS | Status: AC
  Administered 2016-07-20: 1 g via INTRAVENOUS
  Filled 2016-07-20: qty 10

## 2016-07-20 MED ORDER — SODIUM CHLORIDE 0.9 % IV SOLN
250.0000 mL | INTRAVENOUS | Status: DC | PRN
  Administered 2016-07-21 – 2016-07-25 (×2): 250 mL via INTRAVENOUS

## 2016-07-20 MED ORDER — DOPAMINE-DEXTROSE 3.2-5 MG/ML-% IV SOLN
0.0000 ug/kg/min | INTRAVENOUS | Status: DC
  Administered 2016-07-21: 5 ug/kg/min via INTRAVENOUS
  Filled 2016-07-20: qty 250

## 2016-07-20 MED ORDER — HEPARIN SODIUM (PORCINE) 5000 UNIT/ML IJ SOLN: 5000.0000 [IU] | Freq: Three times a day (TID) | INTRAMUSCULAR | Status: DC

## 2016-07-20 MED ORDER — AZITHROMYCIN 500 MG IV SOLR: 500.0000 mg | INTRAVENOUS | Status: DC

## 2016-07-20 MED ORDER — DEXTROSE 5 % IV SOLN
1.0000 g | INTRAVENOUS | Status: DC
  Administered 2016-07-21 – 2016-07-24 (×4): 1 g via INTRAVENOUS
  Filled 2016-07-20 (×4): qty 10

## 2016-07-20 MED ORDER — DEXTROSE 5 % IV SOLN
500.0000 mg | Freq: Once | INTRAVENOUS | Status: AC
  Administered 2016-07-20: 500 mg via INTRAVENOUS
  Filled 2016-07-20: qty 500

## 2016-07-20 MED ORDER — FUROSEMIDE 10 MG/ML IJ SOLN
5.0000 mg/h | INTRAVENOUS | Status: DC
  Administered 2016-07-21: 5 mg/h via INTRAVENOUS
  Filled 2016-07-20: qty 25

## 2016-07-20 NOTE — Progress Notes (Signed)
Pharmacy Antibiotic Note  Deanna Johnston is a 71 y.o. female admitted on 07/20/2016 with suspected CAP. Pharmacy has been consulted for azithromycin and ceftriaxone dosing. Patient is afebrile and WBC slightly elevated at 11.6.   Plan: Ceftriaxone 1g IV q24hr Azithromycin 500 mg IV q24hr  Monitor clinical picture and culture data F/u length of therapy  Pharmacy to sign-off as these antibiotics do not require renal adjustment. Please re-consult [place], if needed.   Height: 4\' 10"  (147.3 cm) Weight: 160 lb (72.6 kg) IBW/kg (Calculated) : 40.9  Temp (24hrs), Avg:97.8 F (36.6 C), Min:97.8 F (36.6 C), Max:97.8 F (36.6 C)   Recent Labs Lab 07/20/16 2107  WBC 11.6*    CrCl cannot be calculated (Patient's most recent lab result is older than the maximum 21 days allowed.).    Allergies  Allergen Reactions  . Crestor [Rosuvastatin Calcium]     intolerant  . Prednisone     "just makes me sick"  . Sulfa Antibiotics Swelling  . Thyroid Hormones   . Niaspan [Niacin] Nausea Only    nausea    Antimicrobials this admission: 6/7 Azith >>  6/7 CTX >>   Dose adjustments this admission: n/a    Microbiology results: 6/7 Blood Cx >>   Argie Ramming, PharmD Pharmacy Resident  Pager [phone] 07/20/16 9:52 PM

## 2016-07-20 NOTE — ED Notes (Signed)
Cardiologist at bedside.  

## 2016-07-20 NOTE — ED Triage Notes (Signed)
Pt from home with complaint of bilateral arm/leg edema and shob. BP 70s-80ps/40s in triage. 3+ pitting edema in both lower extremities. Pt has copd but does not wear o2. Spo2 on RA 73% in triage came up to 87% on 4L [place].

## 2016-07-20 NOTE — ED Provider Notes (Signed)
[facility] DEPT Provider Note   CSN: [id number] Arrival date & time: 07/20/16  2036     History   Chief Complaint Chief Complaint  Patient presents with  . Leg Swelling  . low o2 sat    HPI Deanna Johnston is a 71 y.o. female.  HPI  Patient is a 71 year old female past medical history significant for COPD, coronary artery disease, who presents to the emergency department with a 2 week history of progressively worsening generalized weakness and shortness of breath. States that she has had fatigue and not felt like she wanted to get out of the house for the past 2 weeks. States her shortness of breath has been progressively worsening over the past year, acutely worsened over the past 1 week. Endorses a chronic cough with some sputum production. States her swelling started today in her lower extremities. Denies chest pain, nausea, vomiting, diaphoresis, abdominal pain, dysuria. States that she is not compliant with multiple home medications or her home inhalers. Endorses "dark urine over the past couple of days". No falls or trauma.  Past Medical History:  Diagnosis Date  . Cataract   . Cervical dysplasia   . COPD (chronic obstructive pulmonary disease) ([place])   . Coronary artery disease   . Hyperlipidemia   . MI (myocardial infarction) [facility]) Dec. 2011   stent mid right coronary  . Obesity   . Tobacco dependence   . VAIN (vaginal intraepithelial neoplasia) 2011   Efudex treatment    Patient Active Problem List   Diagnosis Date Noted  . Acute on chronic heart failure ([place]) 07/20/2016  . COPD GOLD II 03/12/2013  . Cough 03/11/2013  . Essential hypertension, benign 03/11/2013  . Hyperlipidemia   . Coronary artery disease   . Obesity   . MI (myocardial infarction) ([place])   . Tobacco dependence     Past Surgical History:  Procedure Laterality Date  . ABDOMINAL HYSTERECTOMY     TAH BSO  . anal fistula repair    . CARDIAC CATHETERIZATION  2011 Dec.   stent mid right  coronary,Primus element stent  . CATARACT EXTRACTION    . COLPOSCOPY    . OOPHORECTOMY     BSO  . OTHER SURGICAL HISTORY     hysterectomy    OB History    Gravida Para Term Preterm AB Living   0             SAB TAB Ectopic Multiple Live Births                   Home Medications    Prior to Admission medications   Medication Sig Start Date End Date Taking? Authorizing Provider  aspirin EC 81 MG tablet Take 81 mg by mouth daily.   Yes [provider]  metoprolol tartrate (LOPRESSOR) 25 MG tablet TAKE ONE TABLET BY MOUTH TWICE DAILY 04/12/16  Yes Martinique, Peter M, MD  nitroGLYCERIN (NITROSTAT) 0.4 MG SL tablet PLACE 1 TABLET UNDER THE TONGUE EVERY 5 MINUTES AS NEEDED FOR CHEST PAIN 04/12/16  Yes Martinique, Peter M, MD  valsartan (DIOVAN) 80 MG tablet TAKE ONE TABLET BY MOUTH ONCE DAILY 06/19/16  Yes Martinique, Peter M, MD    Family History Family History  Problem Relation Age of Onset  . Heart failure Mother   . Stomach cancer Mother   . Heart disease Father   . Diabetes Father   . Colon cancer Sister   . Heart disease Brother     Social  History Social History  Substance Use Topics  . Smoking status: Current Every Day Smoker    Packs/day: 0.50    Years: 47.00    Types: Cigarettes  . Smokeless tobacco: Never Used  . Alcohol use 0.0 oz/week     Comment: Rare     Allergies   Crestor [rosuvastatin calcium]; Prednisone; Sulfa antibiotics; Thyroid hormones; and Niaspan [niacin]   Review of Systems Review of Systems  Constitutional: Positive for appetite change and chills. Negative for diaphoresis and fever.  HENT: Negative for congestion.   Respiratory: Positive for shortness of breath. Negative for cough, chest tightness and wheezing.   Cardiovascular: Positive for leg swelling. Negative for chest pain.  Gastrointestinal: Negative for abdominal pain, diarrhea and vomiting.  Genitourinary: Negative for decreased urine volume, difficulty urinating, dysuria, flank  pain and hematuria.  Musculoskeletal: Negative for back pain.  Skin: Negative for rash.  Neurological: Negative for dizziness, seizures, syncope, weakness and light-headedness.  Psychiatric/Behavioral: Negative for behavioral problems.     Physical Exam Updated Vital Signs BP (!) 65/57   Pulse 71   Temp 97.8 F (36.6 C) (Oral)   Resp (!) 30   Ht 4\' 10"  (1.473 m)   Wt 72.6 kg (160 lb)   SpO2 92%   BMI 33.44 kg/m   Physical Exam  Constitutional: She is oriented to person, place, and time. She appears well-developed and well-nourished. No distress.  HENT:  Head: Atraumatic.  Mouth/Throat: Oropharynx is clear and moist.  Eyes: Conjunctivae and EOM are normal.  Neck: Normal range of motion. JVD present.  Cardiovascular: Normal rate, regular rhythm, normal heart sounds and intact distal pulses.   No murmur heard. Pulmonary/Chest: Effort normal. No respiratory distress. She has no wheezes. She has rales.  4L [place], no increased work of breathing, no tachypnea. Crackles bilateral lower lung fields. Intermittent expiratory wheezing.  Abdominal: She exhibits distension. She exhibits no mass. There is no tenderness. There is no guarding.  Musculoskeletal: Normal range of motion. She exhibits edema ( +4 pitting edema bilaterally up to knees).  Neurological: She is alert and oriented to person, place, and time.  Skin: Skin is warm. Capillary refill takes less than 2 seconds. No pallor.  Psychiatric: She has a normal mood and affect.     ED Treatments / Results  Labs (all labs ordered are listed, but only abnormal results are displayed) Labs Reviewed  BASIC METABOLIC PANEL - Abnormal; Notable for the following:       Result Value   Sodium 134 (*)    Potassium 5.2 (*)    Chloride 100 (*)    Glucose, Bld 103 (*)    BUN 75 (*)    Creatinine, Ser 1.79 (*)    Calcium 8.5 (*)    GFR calc non Af Amer 27 (*)    GFR calc Af Amer 31 (*)    All other components within normal limits  CBC -  Abnormal; Notable for the following:    WBC 11.6 (*)    RBC 5.83 (*)    Hemoglobin 17.2 (*)    HCT 54.7 (*)    All other components within normal limits  BRAIN NATRIURETIC PEPTIDE - Abnormal; Notable for the following:    B Natriuretic Peptide 1,113.9 (*)    All other components within normal limits  I-STAT ARTERIAL BLOOD GAS, ED - Abnormal; Notable for the following:    pH, Arterial 7.309 (*)    pCO2 arterial 60.2 (*)    pO2, Arterial 63.0 (*)  Bicarbonate 30.4 (*)    All other components within normal limits  CULTURE, BLOOD (ROUTINE X 2)  CULTURE, BLOOD (ROUTINE X 2)  TSH  T4, FREE  URINALYSIS, ROUTINE W REFLEX MICROSCOPIC  BASIC METABOLIC PANEL  BASIC METABOLIC PANEL  BASIC METABOLIC PANEL  BASIC METABOLIC PANEL  MAGNESIUM  PHOSPHORUS  TROPONIN I  TROPONIN I  TROPONIN I  PROCALCITONIN  PROCALCITONIN  I-STAT TROPOININ, ED  I-STAT CG4 LACTIC ACID, ED    EKG  EKG Interpretation  Date/Time:  Thursday July 20 2016 20:46:34 EDT Ventricular Rate:  83 PR Interval:  144 QRS Duration: 74 QT Interval:  352 QTC Calculation: 413 R Axis:   -73 Text Interpretation:  Normal sinus rhythm Possible Left atrial enlargement Left axis deviation Nonspecific ST abnormality Abnormal ECG No STEMI.  Confirmed by Nanda Quinton [phone]) on 07/20/2016 9:44:56 PM       Radiology Dg Chest Portable 1 View  Result Date: 07/20/2016 CLINICAL DATA:  Shortness of breath.  Lower extremity edema. EXAM: PORTABLE CHEST 1 VIEW COMPARISON:  03/11/2013 FINDINGS: Mild cardiomegaly. Atherosclerosis of the thoracic aorta. Heterogeneous bibasilar opacities are increased from prior exam. Mild vascular congestion. Possible blunting of the costophrenic angles. No pneumothorax. The bones appear under mineralized. IMPRESSION: Mild cardiomegaly in vascular congestion. Heterogeneous bibasilar opacities may be atelectasis, pneumonia or pulmonary edema. Thoracic aortic atherosclerosis. Electronically Signed   By: Jeb Levering M.D.   On: 07/20/2016 21:42    Procedures Procedures (including critical care time)  Medications Ordered in ED Medications  azithromycin (ZITHROMAX) 500 mg in dextrose 5 % 250 mL IVPB (not administered)  cefTRIAXone (ROCEPHIN) 1 g in dextrose 5 % 50 mL IVPB (not administered)  0.9 %  sodium chloride infusion (not administered)  heparin injection 5,000 Units (not administered)  furosemide (LASIX) 250 mg in dextrose 5 % 250 mL (1 mg/mL) infusion (not administered)  DOPamine (INTROPIN) 800 mg in dextrose 5 % 250 mL (3.2 mg/mL) infusion (not administered)  sodium chloride 0.9 % bolus 250 mL (0 mLs Intravenous Stopped 07/20/16 2143)  cefTRIAXone (ROCEPHIN) 1 g in dextrose 5 % 50 mL IVPB (0 g Intravenous Stopped 07/20/16 2308)  azithromycin (ZITHROMAX) 500 mg in dextrose 5 % 250 mL IVPB (0 mg Intravenous Stopped 07/21/16 0007)     Initial Impression / Assessment and Plan / ED Course  I have reviewed the triage vital signs and the nursing notes.  Pertinent labs & imaging results that were available during my care of the patient were reviewed by me and considered in my medical decision making (see chart for details).     Patient is a 71 year old female past history significant for coronary artery disease, COPD, who presents to the emergency department with generalized fatigue and dyspnea. Progressive worsening of symptoms over 2 weeks. On arrival patient was found to be hypotensive and hypoxic, O2 73% on room air. 92% with 4 L nasal cannula. No chest pain. Exam notable for JVD, crackles to bilateral lower lung fields with intermittent expiratory wheeze, significant lower extremity edema. Benign abdominal exam.  Clinical picture concerning for new onset heart failure, renal failure, ACS, sepsis. Blood cultures obtained. EKG showed normal sinus rhythm, atrial enlargement, normal intervals, no signs of acute ischemia. Chest x-ray concerning for pulmonary edema and possible pneumonia.  Patient  started on Rocephin and azithromycin for community-acquired pneumonia. Lactic acid 1.45. Leukocytosis of 11.6. Hemoglobin 7.2. BNP 1000s. Creatinine 1.79 (baseline 1). ABG 7.3/60/63/30. Initial troponin negative. Given that the patient has new onset heart  failure and signs of pulmonary edema in the setting of hypotension well give slow IV fluids, given 500 mL bolus with mild improvement of blood pressure. Patient is full code.  Clinical picture is most likely multifactorial with new onset heart failure in the setting of AKI and possible pneumonia. Patient admitted to ICU. Cardiology consult for evaluation.   Final Clinical Impressions(s) / ED Diagnoses   Final diagnoses:  Hypotension, unspecified hypotension type  Hypoxemia    New Prescriptions New Prescriptions   No medications on file     Nathaniel Man, MD 07/21/16 Early Chars, MD 07/21/16 [phone]

## 2016-07-21 ENCOUNTER — Inpatient Hospital Stay (HOSPITAL_COMMUNITY): Payer: Medicare Other

## 2016-07-21 DIAGNOSIS — I509 Heart failure, unspecified: Secondary | ICD-10-CM

## 2016-07-21 DIAGNOSIS — I959 Hypotension, unspecified: Secondary | ICD-10-CM

## 2016-07-21 DIAGNOSIS — J9601 Acute respiratory failure with hypoxia: Secondary | ICD-10-CM

## 2016-07-21 DIAGNOSIS — R570 Cardiogenic shock: Secondary | ICD-10-CM

## 2016-07-21 DIAGNOSIS — L8990 Pressure ulcer of unspecified site, unspecified stage: Secondary | ICD-10-CM | POA: Insufficient documentation

## 2016-07-21 DIAGNOSIS — R0600 Dyspnea, unspecified: Secondary | ICD-10-CM

## 2016-07-21 DIAGNOSIS — I50810 Right heart failure, unspecified: Secondary | ICD-10-CM

## 2016-07-21 DIAGNOSIS — J9602 Acute respiratory failure with hypercapnia: Secondary | ICD-10-CM

## 2016-07-21 LAB — D-DIMER, QUANTITATIVE (NOT AT ARMC): D DIMER QUANT: 1.91 ug{FEU}/mL — AB (ref 0.00–0.50)

## 2016-07-21 LAB — GLUCOSE, CAPILLARY
GLUCOSE-CAPILLARY: 103 mg/dL — AB (ref 65–99)
GLUCOSE-CAPILLARY: 111 mg/dL — AB (ref 65–99)
GLUCOSE-CAPILLARY: 183 mg/dL — AB (ref 65–99)
GLUCOSE-CAPILLARY: 127 mg/dL — AB (ref 65–99)
Glucose-Capillary: 101 mg/dL — ABNORMAL HIGH (ref 65–99)
Glucose-Capillary: 191 mg/dL — ABNORMAL HIGH (ref 65–99)

## 2016-07-21 LAB — URINALYSIS, ROUTINE W REFLEX MICROSCOPIC
Bilirubin Urine: NEGATIVE
GLUCOSE, UA: NEGATIVE mg/dL
Hgb urine dipstick: NEGATIVE
Ketones, ur: NEGATIVE mg/dL
LEUKOCYTES UA: NEGATIVE
Nitrite: NEGATIVE
PH: 5 (ref 5.0–8.0)
Protein, ur: NEGATIVE mg/dL
SPECIFIC GRAVITY, URINE: 1.017 (ref 1.005–1.030)

## 2016-07-21 LAB — POCT I-STAT 3, ART BLOOD GAS (G3+)
Acid-Base Excess: 1 mmol/L (ref 0.0–2.0)
Acid-Base Excess: 3 mmol/L — ABNORMAL HIGH (ref 0.0–2.0)
Acid-base deficit: 1 mmol/L (ref 0.0–2.0)
BICARBONATE: 30.4 mmol/L — AB (ref 20.0–28.0)
Bicarbonate: 32 mmol/L — ABNORMAL HIGH (ref 20.0–28.0)
Bicarbonate: 32.4 mmol/L — ABNORMAL HIGH (ref 20.0–28.0)
O2 SAT: 82 %
O2 Saturation: 87 %
O2 Saturation: 89 %
PCO2 ART: 77.7 mmHg — AB (ref 32.0–48.0)
PCO2 ART: 72 mmHg — AB (ref 32.0–48.0)
PCO2 ART: 67.1 mmHg — AB (ref 32.0–48.0)
PH ART: 7.199 — AB (ref 7.350–7.450)
PH ART: 7.292 — AB (ref 7.350–7.450)
PO2 ART: 58 mmHg — AB (ref 83.0–108.0)
PO2 ART: 62 mmHg — AB (ref 83.0–108.0)
Patient temperature: 98.1
Patient temperature: 97.8
TCO2: 33 mmol/L (ref 0–100)
TCO2: 34 mmol/L (ref 0–100)
TCO2: 34 mmol/L (ref 0–100)
pH, Arterial: 7.254 — ABNORMAL LOW (ref 7.350–7.450)
pO2, Arterial: 65 mmHg — ABNORMAL LOW (ref 83.0–108.0)

## 2016-07-21 LAB — BASIC METABOLIC PANEL
ANION GAP: 8 (ref 5–15)
ANION GAP: 10 (ref 5–15)
ANION GAP: 9 (ref 5–15)
ANION GAP: 7 (ref 5–15)
ANION GAP: 7 (ref 5–15)
ANION GAP: 8 (ref 5–15)
ANION GAP: 9 (ref 5–15)
BUN: 73 mg/dL — AB (ref 6–20)
BUN: 70 mg/dL — ABNORMAL HIGH (ref 6–20)
BUN: 69 mg/dL — AB (ref 6–20)
BUN: 67 mg/dL — AB (ref 6–20)
BUN: 63 mg/dL — AB (ref 6–20)
BUN: 57 mg/dL — AB (ref 6–20)
BUN: 53 mg/dL — AB (ref 6–20)
CHLORIDE: 96 mmol/L — AB (ref 101–111)
CHLORIDE: 96 mmol/L — AB (ref 101–111)
CHLORIDE: 95 mmol/L — AB (ref 101–111)
CHLORIDE: 98 mmol/L — AB (ref 101–111)
CHLORIDE: 98 mmol/L — AB (ref 101–111)
CHLORIDE: 97 mmol/L — AB (ref 101–111)
CHLORIDE: 94 mmol/L — AB (ref 101–111)
CO2: 27 mmol/L (ref 22–32)
CO2: 28 mmol/L (ref 22–32)
CO2: 31 mmol/L (ref 22–32)
CO2: 30 mmol/L (ref 22–32)
CO2: 30 mmol/L (ref 22–32)
CO2: 32 mmol/L (ref 22–32)
CO2: 34 mmol/L — ABNORMAL HIGH (ref 22–32)
Calcium: 8.3 mg/dL — ABNORMAL LOW (ref 8.9–10.3)
Calcium: 8.4 mg/dL — ABNORMAL LOW (ref 8.9–10.3)
Calcium: 8.5 mg/dL — ABNORMAL LOW (ref 8.9–10.3)
Calcium: 8.4 mg/dL — ABNORMAL LOW (ref 8.9–10.3)
Calcium: 8.3 mg/dL — ABNORMAL LOW (ref 8.9–10.3)
Calcium: 8 mg/dL — ABNORMAL LOW (ref 8.9–10.3)
Calcium: 8.4 mg/dL — ABNORMAL LOW (ref 8.9–10.3)
Creatinine, Ser: 1.69 mg/dL — ABNORMAL HIGH (ref 0.44–1.00)
Creatinine, Ser: 1.53 mg/dL — ABNORMAL HIGH (ref 0.44–1.00)
Creatinine, Ser: 1.51 mg/dL — ABNORMAL HIGH (ref 0.44–1.00)
Creatinine, Ser: 1.46 mg/dL — ABNORMAL HIGH (ref 0.44–1.00)
Creatinine, Ser: 1.4 mg/dL — ABNORMAL HIGH (ref 0.44–1.00)
Creatinine, Ser: 1.39 mg/dL — ABNORMAL HIGH (ref 0.44–1.00)
Creatinine, Ser: 1.52 mg/dL — ABNORMAL HIGH (ref 0.44–1.00)
GFR calc Af Amer: 40 mL/min — ABNORMAL LOW (ref >60)
GFR calc Af Amer: 42 mL/min — ABNORMAL LOW (ref >60)
GFR calc Af Amer: 42 mL/min — ABNORMAL LOW (ref >60)
GFR calc Af Amer: 38 mL/min — ABNORMAL LOW (ref >60)
GFR calc non Af Amer: 34 mL/min — ABNORMAL LOW (ref >60)
GFR, EST AFRICAN AMERICAN: 34 mL/min — AB (ref >60)
GFR, EST AFRICAN AMERICAN: 38 mL/min — AB (ref >60)
GFR, EST AFRICAN AMERICAN: 38 mL/min — AB (ref >60)
GFR, EST NON AFRICAN AMERICAN: 29 mL/min — AB (ref >60)
GFR, EST NON AFRICAN AMERICAN: 33 mL/min — AB (ref >60)
GFR, EST NON AFRICAN AMERICAN: 33 mL/min — AB (ref >60)
GFR, EST NON AFRICAN AMERICAN: 36 mL/min — AB (ref >60)
GFR, EST NON AFRICAN AMERICAN: 37 mL/min — AB (ref >60)
GFR, EST NON AFRICAN AMERICAN: 33 mL/min — AB (ref >60)
GLUCOSE: 98 mg/dL (ref 65–99)
GLUCOSE: 115 mg/dL — AB (ref 65–99)
Glucose, Bld: 198 mg/dL — ABNORMAL HIGH (ref 65–99)
Glucose, Bld: 107 mg/dL — ABNORMAL HIGH (ref 65–99)
Glucose, Bld: 99 mg/dL (ref 65–99)
Glucose, Bld: 161 mg/dL — ABNORMAL HIGH (ref 65–99)
Glucose, Bld: 126 mg/dL — ABNORMAL HIGH (ref 65–99)
POTASSIUM: 5.5 mmol/L — AB (ref 3.5–5.1)
POTASSIUM: 5 mmol/L (ref 3.5–5.1)
POTASSIUM: 5 mmol/L (ref 3.5–5.1)
POTASSIUM: 5.1 mmol/L (ref 3.5–5.1)
POTASSIUM: 4.9 mmol/L (ref 3.5–5.1)
POTASSIUM: 4.8 mmol/L (ref 3.5–5.1)
POTASSIUM: 4.7 mmol/L (ref 3.5–5.1)
SODIUM: 131 mmol/L — AB (ref 135–145)
SODIUM: 134 mmol/L — AB (ref 135–145)
SODIUM: 135 mmol/L (ref 135–145)
SODIUM: 135 mmol/L (ref 135–145)
SODIUM: 137 mmol/L (ref 135–145)
SODIUM: 137 mmol/L (ref 135–145)
Sodium: 135 mmol/L (ref 135–145)

## 2016-07-21 LAB — MRSA PCR SCREENING: MRSA BY PCR: NEGATIVE

## 2016-07-21 LAB — MAGNESIUM: MAGNESIUM: 2.5 mg/dL — AB (ref 1.7–2.4)

## 2016-07-21 LAB — TROPONIN I
TROPONIN I: 0.06 ng/mL — AB (ref <0.03)
TROPONIN I: 0.06 ng/mL — AB (ref <0.03)
Troponin I: 0.06 ng/mL (ref <0.03)

## 2016-07-21 LAB — ECHOCARDIOGRAM COMPLETE
Height: 58 in
Weight: 2867.74 oz

## 2016-07-21 LAB — PHOSPHORUS: PHOSPHORUS: 5 mg/dL — AB (ref 2.5–4.6)

## 2016-07-21 LAB — RAPID URINE DRUG SCREEN, HOSP PERFORMED
AMPHETAMINES: NOT DETECTED
BENZODIAZEPINES: NOT DETECTED
Barbiturates: NOT DETECTED
Cocaine: NOT DETECTED
OPIATES: NOT DETECTED
Tetrahydrocannabinol: NOT DETECTED

## 2016-07-21 LAB — PROCALCITONIN
Procalcitonin: <0.1 ng/mL
Procalcitonin: <0.1 ng/mL

## 2016-07-21 MED ORDER — HEPARIN (PORCINE) IN NACL 100-0.45 UNIT/ML-% IJ SOLN
1200.0000 [IU]/h | INTRAMUSCULAR | Status: DC
  Administered 2016-07-21: 1200 [IU]/h via INTRAVENOUS
  Filled 2016-07-21: qty 250

## 2016-07-21 MED ORDER — ORAL CARE MOUTH RINSE
15.0000 mL | Freq: Two times a day (BID) | OROMUCOSAL | Status: DC
  Administered 2016-07-21 (×2): 15 mL via OROMUCOSAL

## 2016-07-21 MED ORDER — SODIUM CHLORIDE 0.9 % IV SOLN: INTRAVENOUS | Status: DC | PRN

## 2016-07-21 MED ORDER — ORAL CARE MOUTH RINSE
15.0000 mL | Freq: Two times a day (BID) | OROMUCOSAL | Status: DC
  Administered 2016-07-21 – 2016-07-28 (×8): 15 mL via OROMUCOSAL

## 2016-07-21 MED ORDER — CHLORHEXIDINE GLUCONATE 0.12 % MT SOLN
15.0000 mL | Freq: Two times a day (BID) | OROMUCOSAL | Status: DC
  Administered 2016-07-21 (×2): 15 mL via OROMUCOSAL

## 2016-07-21 MED ORDER — HEPARIN BOLUS VIA INFUSION
5000.0000 [IU] | Freq: Once | INTRAVENOUS | Status: AC
  Administered 2016-07-21: 5000 [IU] via INTRAVENOUS
  Filled 2016-07-21: qty 5000

## 2016-07-21 MED ORDER — INSULIN ASPART 100 UNIT/ML ~~LOC~~ SOLN
0.0000 [IU] | Freq: Three times a day (TID) | SUBCUTANEOUS | Status: DC
  Administered 2016-07-22: 2 [IU] via SUBCUTANEOUS
  Administered 2016-07-22: 3 [IU] via SUBCUTANEOUS
  Administered 2016-07-22: 2 [IU] via SUBCUTANEOUS
  Administered 2016-07-23: 1 [IU] via SUBCUTANEOUS
  Administered 2016-07-23 – 2016-07-25 (×5): 2 [IU] via SUBCUTANEOUS
  Administered 2016-07-26 – 2016-07-27 (×2): 1 [IU] via SUBCUTANEOUS
  Administered 2016-07-29: 2 [IU] via SUBCUTANEOUS

## 2016-07-21 MED ORDER — ASPIRIN EC 81 MG PO TBEC
81.0000 mg | DELAYED_RELEASE_TABLET | Freq: Every day | ORAL | Status: DC
  Administered 2016-07-22 – 2016-07-29 (×7): 81 mg via ORAL
  Filled 2016-07-21 (×7): qty 1

## 2016-07-21 MED ORDER — IOPAMIDOL (ISOVUE-370) INJECTION 76%
INTRAVENOUS | Status: AC
  Administered 2016-07-21: 100 mL
  Filled 2016-07-21: qty 100

## 2016-07-21 MED ORDER — NOREPINEPHRINE BITARTRATE 1 MG/ML IV SOLN
0.0000 ug/min | INTRAVENOUS | Status: DC
  Administered 2016-07-21: 14 ug/min via INTRAVENOUS
  Administered 2016-07-21: 13 ug/min via INTRAVENOUS
  Administered 2016-07-21: 2 ug/min via INTRAVENOUS
  Administered 2016-07-22: 15 ug/min via INTRAVENOUS
  Administered 2016-07-22: 14 ug/min via INTRAVENOUS
  Administered 2016-07-22: 12 ug/min via INTRAVENOUS
  Administered 2016-07-22: 13 ug/min via INTRAVENOUS
  Administered 2016-07-23: 12 ug/min via INTRAVENOUS
  Administered 2016-07-23: 11 ug/min via INTRAVENOUS
  Administered 2016-07-23: 8 ug/min via INTRAVENOUS
  Administered 2016-07-24: 7 ug/min via INTRAVENOUS
  Filled 2016-07-21 (×12): qty 4

## 2016-07-21 MED ORDER — INSULIN ASPART 100 UNIT/ML ~~LOC~~ SOLN: 0.0000 [IU] | Freq: Every day | SUBCUTANEOUS | Status: DC

## 2016-07-21 MED ORDER — FUROSEMIDE 10 MG/ML IJ SOLN
40.0000 mg | Freq: Two times a day (BID) | INTRAMUSCULAR | Status: DC
  Administered 2016-07-22 – 2016-07-25 (×7): 40 mg via INTRAVENOUS
  Filled 2016-07-21 (×7): qty 4

## 2016-07-21 MED ORDER — IPRATROPIUM-ALBUTEROL 0.5-2.5 (3) MG/3ML IN SOLN
3.0000 mL | Freq: Four times a day (QID) | RESPIRATORY_TRACT | Status: DC
  Administered 2016-07-21 – 2016-07-22 (×8): 3 mL via RESPIRATORY_TRACT
  Filled 2016-07-21 (×8): qty 3

## 2016-07-21 NOTE — Progress Notes (Signed)
   07/21/16 1300  Clinical Encounter Type  Visited With Patient and family together;Health care provider  Visit Type Initial;Spiritual support  Referral From Nurse  Consult/Referral To Chaplain  Spiritual Encounters  Spiritual Needs Literature;Emotional  Stress Factors  Patient Stress Factors Family relationships;Health changes;Lack of caregivers  Family Stress Factors Family relationships;Lack of knowledge    Completed ADHCPOA, contacted notary and provided education. Provided emotional support and ministry of presence. Gave completed document to nurse for copies and chart. Clive Parcel L. Volanda Napoleon, MDiv

## 2016-07-21 NOTE — Progress Notes (Signed)
CRITICAL VALUE ALERT  Critical Value:  Troponin 0.06  Date & Time Notied:  07/21/16, 2863  Provider Notified: Yes  Orders Received/Actions taken: None received.  Sherlie Ban, RN

## 2016-07-21 NOTE — Progress Notes (Signed)
Patient is resting comfortably sitting up in chair on 4L [place]. No respiratory distress noted. BIPAP is not needed at this time. RT will monitor as needed.

## 2016-07-21 NOTE — Consult Note (Signed)
Cardiology Consult    Patient ID: Deanna Johnston MRN: [id number], DOB/AGE: December 24, 1945   Admit date: 07/20/2016 Date of Consult: 07/21/2016  Primary Physician: Kelton Pillar, MD Primary Cardiologist: Martinique, Peter   Requesting Provider: ED  Patient Profile    Deanna Johnston is a 71 y.o. female with a history of CAD.  She is s/p stenting of the mid RCA in Dec 2011 with a DES. She had residual 70% stenosis in the mid LCx and 90% in the distal LCx.  She presents to the ED with 2 week history of progressive swelling of her legs. She also complains of fatigue and increasing SOB on top of her baseline SOB due to COPD. More orthopnea and no PND. She does not on home O2. She has not missed any meds. She was noted to be more wheezy.   In the ED she was found to be hypotensive in 70's syst. And hypoxic requiring high flow O2. She was given IV fluids and cardiology was called.    Past Medical History   Past Medical History:  Diagnosis Date  . Cataract   . Cervical dysplasia   . COPD (chronic obstructive pulmonary disease) ([place])   . Coronary artery disease   . Hyperlipidemia   . MI (myocardial infarction) [facility]) Dec. 2011   stent mid right coronary  . Obesity   . Tobacco dependence   . VAIN (vaginal intraepithelial neoplasia) 2011   Efudex treatment    Past Surgical History:  Procedure Laterality Date  . ABDOMINAL HYSTERECTOMY     TAH BSO  . anal fistula repair    . CARDIAC CATHETERIZATION  2011 Dec.   stent mid right coronary,Primus element stent  . CATARACT EXTRACTION    . COLPOSCOPY    . OOPHORECTOMY     BSO  . OTHER SURGICAL HISTORY     hysterectomy     Allergies  Allergies  Allergen Reactions  . Crestor [Rosuvastatin Calcium]     intolerant  . Prednisone     "just makes me sick"  . Sulfa Antibiotics Swelling  . Thyroid Hormones Other (See Comments)    Paranoid   . Niaspan [Niacin] Nausea Only    nausea      Inpatient Medications    . heparin  5,000 Units  Subcutaneous Q8H    Family History    Family History  Problem Relation Age of Onset  . Heart failure Mother   . Stomach cancer Mother   . Heart disease Father   . Diabetes Father   . Colon cancer Sister   . Heart disease Brother     Social History    Social History   Social History  . Marital status: Single    Spouse name: N/A  . Number of children: N/A  . Years of education: N/A   Occupational History  . Retired Retired   Social History Main Topics  . Smoking status: Current Every Day Smoker    Packs/day: 0.50    Years: 47.00    Types: Cigarettes  . Smokeless tobacco: Never Used  . Alcohol use 0.0 oz/week     Comment: Rare  . Drug use: No  . Sexual activity: No     Comment: HYST-1st intercourse 71 yo-More than 5 partners   Other Topics Concern  . Not on file   Social History Narrative  . No narrative on file     Review of Systems    General:  No chills, fever, night sweats  or weight changes.  Cardiovascular:  No chest pain, dyspnea on exertion, edema, orthopnea, palpitations, paroxysmal nocturnal dyspnea. Dermatological: No rash, lesions/masses Respiratory: No cough, dyspnea Urologic: No hematuria, dysuria Abdominal:   No nausea, vomiting, diarrhea, bright red blood per rectum, melena, or hematemesis Neurologic:  No visual changes, wkns, changes in mental status. All other systems reviewed and are otherwise negative except as noted above.  Physical Exam    Blood pressure (!) 92/59, pulse 75, temperature 97.8 F (36.6 C), temperature source Oral, resp. rate (!) 26, height 4\' 10"  (1.473 m), weight 72.6 kg (160 lb), SpO2 90 %.  General: mild distress Psych: Normal affect. Neuro: Alert and oriented X 3. Moves all extremities spontaneously. HEENT: Normal  Neck: JVD Lungs:  crackles Heart: RRR no s3, s4, or murmurs. Abdomen: Soft, non-tender, non-distended, BS + x 4.  Extremities:3+ LEE  Labs    Troponin [facility] Test)  Recent Labs   07/20/16 2117  TROPIPOC 0.06   No results for input(s): CKTOTAL, CKMB, TROPONINI in the last 72 hours. Lab Results  Component Value Date   WBC 11.6 (H) 07/20/2016   HGB 17.2 (H) 07/20/2016   HCT 54.7 (H) 07/20/2016   MCV 93.8 07/20/2016   PLT 239 07/20/2016    Recent Labs Lab 07/20/16 2107  NA 134*  K 5.2*  CL 100*  CO2 25  BUN 75*  CREATININE 1.79*  CALCIUM 8.5*  GLUCOSE 103*   Lab Results  Component Value Date   CHOL 190 08/26/2010   HDL 52.50 08/26/2010   LDLCALC 113 (H) 08/26/2010   TRIG 121.0 08/26/2010   Lab Results  Component Value Date   DDIMER (H) 02/04/2010    1.04        AT THE INHOUSE ESTABLISHED CUTOFF VALUE OF 0.48 ug/mL FEU, THIS ASSAY HAS BEEN DOCUMENTED IN THE LITERATURE TO HAVE A SENSITIVITY AND NEGATIVE PREDICTIVE VALUE OF AT LEAST 98 TO 99%.  THE TEST RESULT SHOULD BE CORRELATED WITH AN ASSESSMENT OF THE CLINICAL PROBABILITY OF DVT / VTE.     Radiology Studies    Dg Chest Portable 1 View  Result Date: 07/20/2016 CLINICAL DATA:  Shortness of breath.  Lower extremity edema. EXAM: PORTABLE CHEST 1 VIEW COMPARISON:  03/11/2013 FINDINGS: Mild cardiomegaly. Atherosclerosis of the thoracic aorta. Heterogeneous bibasilar opacities are increased from prior exam. Mild vascular congestion. Possible blunting of the costophrenic angles. No pneumothorax. The bones appear under mineralized. IMPRESSION: Mild cardiomegaly in vascular congestion. Heterogeneous bibasilar opacities may be atelectasis, pneumonia or pulmonary edema. Thoracic aortic atherosclerosis. Electronically Signed   By: Jeb Levering M.D.   On: 07/20/2016 21:42    BNP eevalted to 1100  ECG & Cardiac Imaging    NSR with diffuse ST depressions <8mm  Assessment & Plan    Mrs Glace has CAD, severe COPD. Here with hypotension, SOB, and fluid overlaod. Based on the physical exam, labs and imaging she appears to be in acute congestive HF. LVEF is unknown. Some component of COPD  exacerbation is possible. She is appears to be in shock as her baseline BP is 120's, she is cold on exam. She has evidence of end organ damage with a creatinine elevation. Lactate is wnl.   Recommendations:  - Start on iv diuretics (would start high 60-80 iv lasix bolus BID or 56ml/hr drip).  - Can consider dopamine at 5 for BP augmentation. However if her urine output responds well to diuresis you can hold off on this unless her BP continues  to fall (currently 80's syst).  - TTE in am - hold her ARB, BB.  - No need for anticoagulation as no evidence of acute MI on labs and insufficient evidence on ECG  Signed, Cristina Gong, MD 07/21/2016, 12:06 AM

## 2016-07-21 NOTE — H&P (Signed)
PULMONARY / CRITICAL CARE MEDICINE   Name: Deanna Johnston MRN: [id number] DOB: 07-29-1945    ADMISSION DATE:  07/20/2016 CONSULTATION DATE:  07/21/2016  REFERRING MD:  Dr. Jori Moll   CHIEF COMPLAINT:  Dyspnea   HISTORY OF PRESENT ILLNESS:   71 year old female with PMH of COPD (Active smoker, 40-50 year 2 ppd, reports cut back to 1/2 ppd), CAD s/p stents in 2011, HLD, MI. Reports at baseline is SOB with chronic cough.   Presents to ED on 6/7 with progressive dyspnea for the last 2 weeks with increased swelling to BLE. Upon arrival to ED BNP 1113.9, WBC 11.6, Lactic 1.45, ABG 7.3/60/63. Hypotensive with systolic 40-98, oxygenation 90s on 4L [place], received 500 ml bolus and Rocephin/Azithromycin. Cardiology was consulted who recommended diuresis. PCCM asked to admit.   PAST MEDICAL HISTORY :  She  has a past medical history of Cataract; Cervical dysplasia; COPD (chronic obstructive pulmonary disease) ([place]); Coronary artery disease; Hyperlipidemia; MI (myocardial infarction) ([place]) (Dec. 2011); Obesity; Tobacco dependence; and VAIN (vaginal intraepithelial neoplasia) (2011).  PAST SURGICAL HISTORY: She  has a past surgical history that includes Cardiac catheterization (2011 Dec.); Other surgical history; Abdominal hysterectomy; Oophorectomy; Colposcopy; anal fistula repair; and Cataract extraction.  Allergies  Allergen Reactions  . Crestor [Rosuvastatin Calcium]     intolerant  . Prednisone     "just makes me sick"  . Sulfa Antibiotics Swelling  . Thyroid Hormones Other (See Comments)    Paranoid   . Niaspan [Niacin] Nausea Only    nausea    No current facility-administered medications on file prior to encounter.    Current Outpatient Prescriptions on File Prior to Encounter  Medication Sig  . aspirin EC 81 MG tablet Take 81 mg by mouth daily.  . metoprolol tartrate (LOPRESSOR) 25 MG tablet TAKE ONE TABLET BY MOUTH TWICE DAILY  . nitroGLYCERIN (NITROSTAT) 0.4 MG SL tablet PLACE 1 TABLET  UNDER THE TONGUE EVERY 5 MINUTES AS NEEDED FOR CHEST PAIN  . valsartan (DIOVAN) 80 MG tablet TAKE ONE TABLET BY MOUTH ONCE DAILY    FAMILY HISTORY:  Her indicated that her mother is deceased. She indicated that her father is deceased. She indicated that her sister is alive. She indicated that her brother is alive. She indicated that her maternal grandmother is deceased. She indicated that her maternal grandfather is deceased. She indicated that her paternal grandmother is deceased. She indicated that her paternal grandfather is deceased.    SOCIAL HISTORY: She  reports that she has been smoking Cigarettes.  She has a 23.50 pack-year smoking history. She has never used smokeless tobacco. She reports that she drinks alcohol. She reports that she does not use drugs.  REVIEW OF SYSTEMS:   All negative; except for those that are bolded, which indicate positives.  Constitutional: weight loss, weight gain, night sweats, fevers, chills, fatigue, weakness.  HEENT: headaches, sore throat, sneezing, nasal congestion, post nasal drip, difficulty swallowing, tooth/dental problems, visual complaints, visual changes, ear aches. Neuro: difficulty with speech, weakness, numbness, ataxia. CV:  chest pain, orthopnea, PND, swelling in lower extremities, dizziness, palpitations, syncope.  Resp: cough, hemoptysis, dyspnea, wheezing. GI: heartburn, indigestion, abdominal pain, nausea, vomiting, diarrhea, constipation, change in bowel habits, loss of appetite, hematemesis, melena, hematochezia.  GU: dysuria, change in color of urine, urgency or frequency, flank pain, hematuria. MSK: joint pain or swelling, decreased range of motion. Psych: change in mood or affect, depression, anxiety, suicidal ideations, homicidal ideations. Skin: rash, itching, bruising.   SUBJECTIVE:  In ED. Reports improvement of dyspnea. States chronic pain to hips and back.   VITAL SIGNS: BP (!) 65/57   Pulse 71   Temp 97.8 F (36.6  C) (Oral)   Resp (!) 30   Ht 4\' 10"  (1.473 m)   Wt 72.6 kg (160 lb)   SpO2 92%   BMI 33.44 kg/m   HEMODYNAMICS:    VENTILATOR SETTINGS:    INTAKE / OUTPUT: No intake/output data recorded.  PHYSICAL EXAMINATION: General:  Adult female, no distress  Neuro:  Alert, oriented, follows commands  HEENT:  Normocephalic  Cardiovascular:  RRR, no MRG Lungs:  Crackles noted to bases, exp wheezes, non-labored  Abdomen:  Obese, active bowel sounds  Musculoskeletal:  +3 edema to BLE Skin:  Wounds noted to elbows, blisters to right foot   LABS:  BMET  Recent Labs Lab 07/20/16 2107  NA 134*  K 5.2*  CL 100*  CO2 25  BUN 75*  CREATININE 1.79*  GLUCOSE 103*    Electrolytes  Recent Labs Lab 07/20/16 2107  CALCIUM 8.5*    CBC  Recent Labs Lab 07/20/16 2107  WBC 11.6*  HGB 17.2*  HCT 54.7*  PLT 239    Coag's No results for input(s): APTT, INR in the last 168 hours.  Sepsis Markers  Recent Labs Lab 07/20/16 2244  LATICACIDVEN 1.45    ABG  Recent Labs Lab 07/20/16 2231  PHART 7.309*  PCO2ART 60.2*  PO2ART 63.0*    Liver Enzymes No results for input(s): AST, ALT, ALKPHOS, BILITOT, ALBUMIN in the last 168 hours.  Cardiac Enzymes No results for input(s): TROPONINI, PROBNP in the last 168 hours.  Glucose No results for input(s): GLUCAP in the last 168 hours.  Imaging Dg Chest Portable 1 View  Result Date: 07/20/2016 CLINICAL DATA:  Shortness of breath.  Lower extremity edema. EXAM: PORTABLE CHEST 1 VIEW COMPARISON:  03/11/2013 FINDINGS: Mild cardiomegaly. Atherosclerosis of the thoracic aorta. Heterogeneous bibasilar opacities are increased from prior exam. Mild vascular congestion. Possible blunting of the costophrenic angles. No pneumothorax. The bones appear under mineralized. IMPRESSION: Mild cardiomegaly in vascular congestion. Heterogeneous bibasilar opacities may be atelectasis, pneumonia or pulmonary edema. Thoracic aortic atherosclerosis.  Electronically Signed   By: Jeb Levering M.D.   On: 07/20/2016 21:42     STUDIES:  CXR 6/7 > Mild cardiomegaly in vascular congestion. Heterogeneous bibasilar opacities may be atelectasis, PNA, or pulmonary edema  ECHO 6/8 >>  CULTURES: Blood 6/7 >> Sputum 6/8 >>  ANTIBIOTICS: Azithromycin 6/7 > Rocephin 6/7 >   SIGNIFICANT EVENTS: 6/7 > Presents to ED SOB  LINES/TUBES: PIV  DISCUSSION: 71 year old female presents to ED with reported 2 weeks of progressive dyspnea and BLE swelling. BNP 1113.9. Cardiology following   ASSESSMENT / PLAN:  PULMONARY A: Acute on Chronic Hypoxic/Hypercarbic Respiratory Failure +/-AECOPD H/O COPD, Long time Smoker  P:   Maintain Oxygen > 88 BiPAP PRN  Trend CXR Scheduled Duoneb  Pulmonary Hygiene   CARDIOVASCULAR A:  Cardiogenic Shock in setting of acute congestive HF H/O CAD s/p stenting, HLD  P:  Cardiology Following  ECHO pending  Hold home Lopressor and Valsartan  Continue home ASA Lasix GTT at 32mcg/hr  Dopamine for systolic >03  RENAL A:   Acute Renal Injury Volume Overload   Hyperkalemia  P:   Trend BMP q4h Lasix gtt as above   GASTROINTESTINAL A:   No issues  P:   NPO PPI  HEMATOLOGIC A:   No issues  P:  Trend CBC   INFECTIOUS A:   +/-AECOPD Leukocytosis   ?Soft Tissue Infection > multiple wounds noted  P:   Follow culture data  Trend WBC and Fever Curve  Trend Procal  Continue Antibiotics as above   ENDOCRINE A:   No issues    P:   Trend Glucose  TSH and T4 pending   NEUROLOGIC A:   Chronic Pain  P:   RASS goal: 0 Monitor  Hold sedative medications   FAMILY  - Updates: Family updated at bedside. Patient states that she would like [name] (Niece) to be HCPOA [phone] in the event she can not make her own decisions   - Inter-disciplinary family meet or Palliative Care meeting due by:  07/28/2016   CC Time: 68 minutes   Hayden Pedro, AGACNP-BC [place]  Pgr: [phone]  PCCM Pgr: [phone]    ATTENDING NOTE / ATTESTATION NOTE :   I have discussed the case with the resident/APP  Hayden Pedro NP  I agree with the resident/APP's  history, physical examination, assessment, and plans.    I have edited the above note and modified it according to our agreed history, physical examination, assessment and plan.   Briefly, 71 year old female with PMH of COPD (mod-severe with FEV1  1.03 in 2015), Active smoker, 40-50 year 2 ppd, reports cut back to 1/2 ppd,  CAD s/p stents in 2011, HLD, MI. Reports at baseline is SOB with chronic cough.  Pt presents to ED on 6/7 with progressive dyspnea for the last 2 weeks with increased swelling to BLE. Upon arrival to ED BNP 1113.9, WBC 11.6, Lactic 1.45, ABG 7.3/60/63. Hypotensive with systolic 24-58, oxygenation 90s on 4L [place], received 500 ml bolus and Rocephin/Azithromycin. Cardiology was consulted who recommended diuresis. PCCM asked to admit.   She was started on dopamine drip (5 uck/kg/min) as well as Lasix drip (5 mg/hr). She was transferred to the ICU. Her o2 sats hovered in the mid 25s on 6L [place].  She was noted to be more SOB and drowsy.  BiPaP was placed at 12/6, 70% with o2 sats in low 90s.  She has tolerated BiPAP. More comfortable, less distress.  When I saw her, she was asleep, arousable. She denied any subjective complaints. Rpt ABG  7.19 / 79 / 59. We adjusted her BiPAP and increased her IPAP to 16 from 12 and her TV increased from 400+ to 550 +. No significant leak. She seems to be tolerating the higher pressure.  Vitals:  Vitals:   07/21/16 0245 07/21/16 0300 07/21/16 0313 07/21/16 0315  BP: (!) 102/49 (!) 88/72 (!) 96/46 (!) 98/46  Pulse: 67 61 (!) 59 (!) 55  Resp: 18 17 (!) 21 20  Temp:      TempSrc:      SpO2: 96% 92% 92% (!) 89%  Weight:      Height:        Constitutional/General: well-nourished, well-developed,  not in any distress, Comfortable on the BiPAP.  Body  mass index is 37.46 kg/m. Wt Readings from Last 3 Encounters:  07/21/16 81.3 kg (179 lb 3.7 oz)  05/05/16 73.9 kg (163 lb)  11/08/15 74.3 kg (163 lb 12.8 oz)    HEENT: PERLA, anicteric sclerae. (-) Oral thrush. Bipap in place.   Neck: No masses. Midline trachea. No JVD, (-) LAD. (-) bruits appreciated.  Respiratory/Chest: Grossly normal chest. (-) deformity. (-) Accessory muscle use.  Symmetric expansion. Diminished BS on  both lower lung zones. (-) wheezing,rhonchi Crackles at bases (-) egophony  Cardiovascular: Regular rate and  rhythm, heart sounds normal, no murmur or gallops,  Gr 2  peripheral edema  Gastrointestinal:  Normal bowel sounds. Soft, non-tender. No hepatosplenomegaly.  (-) masses.   Musculoskeletal:  Normal muscle tone.   Extremities: Grossly normal. (-) clubbing, cyanosis.  Gr 2 edema  Skin: (-) rash,lesions seen.   Neurological/Psychiatric :  CN grossly intact. (-) lateralizing signs. Arousable, answers questions and follows commands.     CBC Recent Labs     07/20/16  2107  WBC  11.6*  HGB  17.2*  HCT  54.7*  PLT  239    Coag's No results for input(s): APTT, INR in the last 72 hours.  BMET Recent Labs     07/20/16  2107  07/21/16  0101  NA  134*  131*  K  5.2*  5.5*  CL  100*  96*  CO2  25  27  BUN  75*  73*  CREATININE  1.79*  1.69*  GLUCOSE  103*  198*    Electrolytes Recent Labs     07/20/16  2107  07/21/16  0101  CALCIUM  8.5*  8.3*  MG   --   2.5*  PHOS   --   5.0*    Sepsis Markers Recent Labs     07/21/16  0101  PROCALCITON  <0.10    ABG Recent Labs     07/20/16  2231  07/21/16  0304  PHART  7.309*  7.199*  PCO2ART  60.2*  77.7*  PO2ART  63.0*  58.0*    Liver Enzymes No results for input(s): AST, ALT, ALKPHOS, BILITOT, ALBUMIN in the last 72 hours.  Cardiac Enzymes Recent Labs     07/21/16  0101  TROPONINI  0.06*    Glucose No results for input(s): GLUCAP in the last 72 hours.  Imaging Dg  Chest Portable 1 View  Result Date: 07/20/2016 CLINICAL DATA:  Shortness of breath.  Lower extremity edema. EXAM: PORTABLE CHEST 1 VIEW COMPARISON:  03/11/2013 FINDINGS: Mild cardiomegaly. Atherosclerosis of the thoracic aorta. Heterogeneous bibasilar opacities are increased from prior exam. Mild vascular congestion. Possible blunting of the costophrenic angles. No pneumothorax. The bones appear under mineralized. IMPRESSION: Mild cardiomegaly in vascular congestion. Heterogeneous bibasilar opacities may be atelectasis, pneumonia or pulmonary edema. Thoracic aortic atherosclerosis. Electronically Signed   By: Jeb Levering M.D.   On: 07/20/2016 21:42    Assessment/Plan : Acute on chronic hypoxemic hypercapnic respiratory failure secondary to acute exacerbation of CHFpEF / Pulmonary Edema + Demand ischemia + Most likely with undiagnosed OSA/OHS + COPD (does not appear to be in exacerbation).  +/- CAP.  - Patient appears comfortable on the BiPAP. We adjusted IPAP from 12 to 16 and her TV improved to 500+. Plan to get a blood gas in an hour or so. Her CO2 probably lives in the 59s.  ABG to make sure we are trending correctly.  - Continue Lasix drip. - Continue dopamine drip. - will add pulmicort and cont duoneb QID to optimize her lungs. No role for IV steroids at this point. COPD does not appear to be in exacerbation. - Cont Rocephin and Azithromycin for now.  Deescalate or d/c if she clinically improves with diuresis or cultures remain (-). Her procalcitonin is reassuring.  - trend troponin. Check echo. Check lytes.  - Appreciate cardiology recommendations.   Shock, likely cardiogenic. Less likely septic or hypovolemia.  -  Improved with dopamine drip.  Currently at 5 mcg/kg/m.   AKI, likely related to above. - cont lasix drip for now.    DVT prophylaxis : heparin SQ.   I spent  30  minutes of Critical Care time with this patient today. This is my time spent independent of the APP or  resident.   Family :  No family at bedside.    Monica Becton, MD 07/21/2016, 3:22 AM [facility] Pager [phone] After 3 pm or if no answer, call [phone]

## 2016-07-21 NOTE — Progress Notes (Signed)
PULMONARY / CRITICAL CARE MEDICINE   Name: Deanna Johnston MRN: [id number] DOB: 1945/08/23    ADMISSION DATE:  07/20/2016 CONSULTATION DATE:  07/21/2016  REFERRING MD:  Dr. Jori Moll   CHIEF COMPLAINT:  Dyspnea   HISTORY OF PRESENT ILLNESS:   71 year old female with PMH of COPD (Active smoker, 40-50 year 2 ppd, reports cut back to 1/2 ppd), CAD s/p stents in 2011, HLD, MI. Reports at baseline is SOB with chronic cough.   Presents to ED on 6/7 with progressive dyspnea for the last 2 weeks with increased swelling to BLE. Upon arrival to ED BNP 1113.9, WBC 11.6, Lactic 1.45, ABG 7.3/60/63. Hypotensive with systolic 08-14, oxygenation 90s on 4L [place], received 500 ml bolus and Rocephin/Azithromycin. Cardiology was consulted who recommended diuresis. PCCM asked to admit.    SUBJECTIVE:  Remains on BiPAP, lasix gtt, dopamine.  Per cardiology notes, review of echo shows that RV is barely moving and is dilated (official read pending).  Agree that concern here is possible PE. D-dimer elevated at 1.9.  CTA pending.  Not yet on anticoagulation.  VITAL SIGNS: BP (!) 120/99   Pulse 79   Temp 97.8 F (36.6 C) (Oral)   Resp 18   Ht 4\' 10"  (1.473 m)   Wt 81.3 kg (179 lb 3.7 oz)   SpO2 95%   BMI 37.46 kg/m   HEMODYNAMICS:    VENTILATOR SETTINGS: FiO2 (%):  [50 %-70 %] 50 %  INTAKE / OUTPUT: I/O last 3 completed shifts: In: 654.6 [I.V.:104.6; IV Piggyback:550] Out: 481 [Urine:735]  PHYSICAL EXAMINATION: General:  Adult female, no distress  Neuro:  Alert, oriented, follows commands  HEENT:  Normocephalic.  BiPAP in place Cardiovascular:  RRR, no MRG Lungs:  Faint basilar crackles, otherwise clear Abdomen:  Obese, active bowel sounds  Musculoskeletal:  +3 edema to BLE Skin:  Wounds noted to elbows, blisters to right foot   LABS:  BMET  Recent Labs Lab 07/21/16 0101 07/21/16 0410 07/21/16 0716  NA 131* 134* 135  K 5.5* 5.0 5.0  CL 96* 96* 95*  CO2 27 28 31   BUN 73* 70* 69*   CREATININE 1.69* 1.53* 1.51*  GLUCOSE 198* 107* 99    Electrolytes  Recent Labs Lab 07/21/16 0101 07/21/16 0410 07/21/16 0716  CALCIUM 8.3* 8.4* 8.5*  MG 2.5*  --   --   PHOS 5.0*  --   --     CBC  Recent Labs Lab 07/20/16 2107  WBC 11.6*  HGB 17.2*  HCT 54.7*  PLT 239    Coag's No results for input(s): APTT, INR in the last 168 hours.  Sepsis Markers  Recent Labs Lab 07/20/16 2244 07/21/16 0101 07/21/16 0410  LATICACIDVEN 1.45  --   --   PROCALCITON  --  <0.10 <0.10    ABG  Recent Labs Lab 07/20/16 2231 07/21/16 0304 07/21/16 0419  PHART 7.309* 7.199* 7.254*  PCO2ART 60.2* 77.7* 72.0*  PO2ART 63.0* 58.0* 62.0*    Liver Enzymes No results for input(s): AST, ALT, ALKPHOS, BILITOT, ALBUMIN in the last 168 hours.  Cardiac Enzymes  Recent Labs Lab 07/21/16 0101 07/21/16 0410  TROPONINI 0.06* 0.06*    Glucose  Recent Labs Lab 07/21/16 0328 07/21/16 0759  GLUCAP 101* 103*    Imaging Dg Chest Port 1 View  Result Date: 07/21/2016 CLINICAL DATA:  Shortness of breath. EXAM: PORTABLE CHEST 1 VIEW COMPARISON:  07/20/2016 . FINDINGS: Cardiomegaly. Diffuse bilateral from interstitial prominence with bilateral pleural effusions. Findings  consistent with CHF. Bibasilar pneumonia cannot be excluded. No pneumothorax P IMPRESSION: Congestive heart failure bibasilar pulmonary interstitial edema and small pleural effusions. Electronically Signed   By: Marcello Moores  Register   On: 07/21/2016 07:28   Dg Chest Portable 1 View  Result Date: 07/20/2016 CLINICAL DATA:  Shortness of breath.  Lower extremity edema. EXAM: PORTABLE CHEST 1 VIEW COMPARISON:  03/11/2013 FINDINGS: Mild cardiomegaly. Atherosclerosis of the thoracic aorta. Heterogeneous bibasilar opacities are increased from prior exam. Mild vascular congestion. Possible blunting of the costophrenic angles. No pneumothorax. The bones appear under mineralized. IMPRESSION: Mild cardiomegaly in vascular  congestion. Heterogeneous bibasilar opacities may be atelectasis, pneumonia or pulmonary edema. Thoracic aortic atherosclerosis. Electronically Signed   By: Jeb Levering M.D.   On: 07/20/2016 21:42     STUDIES:  CXR 6/7 > Mild cardiomegaly in vascular congestion. Heterogeneous bibasilar opacities may be atelectasis, PNA, or pulmonary edema  ECHO 6/8 > EF 65-70%, G1DD, mod to severely reduced RV systolic function. CTA chest 6/8 >  LE dupex 3/8 >  CULTURES: Blood 6/7 > Sputum 6/8 >   ANTIBIOTICS: Azithromycin 6/7 > 6/8 Rocephin 6/7 >    SIGNIFICANT EVENTS: 6/7 > Presents to ED SOB  LINES/TUBES: None.  DISCUSSION: 71 year old female presents to ED with reported 2 weeks of progressive dyspnea and BLE swelling. BNP 1113.9. Cardiology following. 6/8 > concern for PE, started on heparin empirically while awaiting LE duplex and CTA chest.  ASSESSMENT / PLAN:  Acute on Chronic Hypoxic/Hypercarbic Respiratory Failure. Possible AECOPD. Concern for PE / DVT - worsening SOB, LE edema.  D-dimer elevated and echo with dilated and weak RV. ? OSA. Tobacco dependence. P:   Continue BiPAP PRN. Maintain SpO2 > 90%. Will need ambulatory desat study prior to d/c to assess for home O2 needs. Continue ceftriaxone.  D/c azithro as doubt CAP.aintain Oxygen > 88. Start heparin gtt empirically. F/u on CTA chest, LE duplex. Continue DuoNebs. Needs outpatient sleep study. CXR intermittently.  Shock - likely multifactorial > ? obstructive due to PE with subsequent cardiogenic due to CHF exac with RV failure (Echo 07/21/16 with EF 65-70%, G1DD, mod to severely reduced RV systolic function).  Another possibility includes chronic RV failure due to underlying lung disease with cor pulmonale. H/O CAD s/p stenting, HLD. P:  Cardiology Following.  Continue lasix. Strict I/O's. Switch dopamine to levophed. Hold home Lopressor and Valsartan. Continue home ASA.  AKI. Volume Overload - improving  gradually. P:   Continue lasix gtt. BMP q4hrs. Correct electrolytes as indicated.   FAMILY  - Updates: Family updated at bedside on admission 6/8. Patient states that she would like [name] (Niece) to be HCPOA [phone] in the event she can not make her own decisions   - Inter-disciplinary family meet or Palliative Care meeting due by:  07/28/2016  CC time: 30 min.   Montey Hora, [facility] Pager: [phone]  or [phone] 07/21/2016, 10:08 AM

## 2016-07-21 NOTE — ED Notes (Signed)
Critical Care NP stated she did not want to have lasix drip and dopamine started until patient arrived to the unit, needed Art line started on the unit.

## 2016-07-21 NOTE — Progress Notes (Addendum)
ABG collected at 10:40, results did not cross over  Critical ABG Results hand delivered to Dr Halford Chessman, trending in right direction, no changes at this time.   PH: 7.29 CO2: 67.1 PaO2: 65 bicarb: 32.4   RN made aware. RT will continue to monitor.

## 2016-07-21 NOTE — Progress Notes (Signed)
ABG performed; critical pH and CO2. Dr. Corrie Dandy MD at bedside and notified. RT notified; to make adjustments to BiPAP. Will continue to closely monitor.  Sherlie Ban, RN

## 2016-07-21 NOTE — Progress Notes (Signed)
PCCM Interval Progress Note  Asked to assess pt at bedside due to lower BP.  CTA and LE duplex reviews > no evidence of PE or DVT.   Plan: Will d/c Heparin gtt.   BP gradually lowering throughout the day.  Pt has been on lasix infusion at 5mg /hr all day, so far is almost -3L just today.  Vasopressors switched from Dopamine to levophed this morning.  Currently on 73mcg/hr levophed. Plan: Will d/c lasix infusion and switch to 40mg  BID pushes starting in AM. Will continue levophed for now, if needs higher doses then will consider switch back to dopamine (had better pressures this morning on dopamine); however, I suspect that her lower BP is due to her -3L balance versus the change in vasopressor.  Continue rest of plan per progress note from earlier today.   Montey Hora, [facility] Pager: [phone]  or [phone] 07/21/2016, 4:50 PM

## 2016-07-21 NOTE — Progress Notes (Addendum)
Progress Note  Patient Name: Deanna Johnston Date of Encounter: 07/21/2016  Primary Cardiologist: Peter Martinique, MD  Subjective   Continues to feel short of breath. Denies chest pain or palpitations.  Inpatient Medications    Scheduled Meds: . aspirin EC  81 mg Oral Daily  . chlorhexidine  15 mL Mouth Rinse BID  . heparin  5,000 Units Subcutaneous Q8H  . ipratropium-albuterol  3 mL Nebulization Q6H  . mouth rinse  15 mL Mouth Rinse q12n4p   Continuous Infusions: . sodium chloride 250 mL (07/21/16 0700)  . sodium chloride    . azithromycin    . cefTRIAXone (ROCEPHIN)  IV    . DOPamine 7 mcg/kg/min (07/21/16 0700)  . furosemide (LASIX) infusion 5 mg/hr (07/21/16 0700)   PRN Meds: sodium chloride, Place/Maintain arterial line **AND** sodium chloride   Vital Signs    Vitals:   07/21/16 0530 07/21/16 0600 07/21/16 0630 07/21/16 0700  BP: (!) 93/49 (!) 104/51 96/76 (!) 109/45  Pulse: 72 68 71 71  Resp: 15 (!) 23 (!) 23 (!) 25  Temp:      TempSrc:      SpO2: 99% 94% 96% 96%  Weight:      Height:        Intake/Output Summary (Last 24 hours) at 07/21/16 0803 Last data filed at 07/21/16 0700  Gross per 24 hour  Intake           654.59 ml  Output              735 ml  Net           -80.41 ml   Filed Weights   07/20/16 2100 07/21/16 0045 07/21/16 0400  Weight: 72.6 kg (160 lb) 81.3 kg (179 lb 3.7 oz) 81.3 kg (179 lb 3.7 oz)    Telemetry    Sinus rhythm. No events area - Personally Reviewed  ECG    Sinus rhythm.  Rate 83 bpm.  LAD. - Personally Reviewed  Physical Exam   GEN: Currently on bipap. Moderate respiratory distress.    Neck:  JVP to mid neck at 45. Cardiac: RRR, no murmurs, rubs, or gallops.  Respiratory: Clear to auscultation bilaterally no crackles, wheezes, or rhonchi. Poor air movemen.   GI: Soft, nontender, non-distended  MS: 2+ pitting edema to upper tibia bilaterally. No deformity. Neuro:  Nonfocal  Psych: Normal affect   Labs      Chemistry Recent Labs Lab 07/20/16 2107 07/21/16 0101 07/21/16 0410  NA 134* 131* 134*  K 5.2* 5.5* 5.0  CL 100* 96* 96*  CO2 25 27 28   GLUCOSE 103* 198* 107*  BUN 75* 73* 70*  CREATININE 1.79* 1.69* 1.53*  CALCIUM 8.5* 8.3* 8.4*  GFRNONAA 27* 29* 33*  GFRAA 31* 34* 38*  ANIONGAP 9 8 10      Hematology Recent Labs Lab 07/20/16 2107  WBC 11.6*  RBC 5.83*  HGB 17.2*  HCT 54.7*  MCV 93.8  MCH 29.5  MCHC 31.4  RDW 15.2  PLT 239    Cardiac Enzymes Recent Labs Lab 07/21/16 0101 07/21/16 0410  TROPONINI 0.06* 0.06*    Recent Labs Lab 07/20/16 2117  TROPIPOC 0.06     BNP Recent Labs Lab 07/20/16 2107  BNP 1,113.9*     DDimer No results for input(s): DDIMER in the last 168 hours.   Radiology    Dg Chest Port 1 View  Result Date: 07/21/2016 CLINICAL DATA:  Shortness of breath. EXAM: PORTABLE CHEST  1 VIEW COMPARISON:  07/20/2016 . FINDINGS: Cardiomegaly. Diffuse bilateral from interstitial prominence with bilateral pleural effusions. Findings consistent with CHF. Bibasilar pneumonia cannot be excluded. No pneumothorax P IMPRESSION: Congestive heart failure bibasilar pulmonary interstitial edema and small pleural effusions. Electronically Signed   By: Marcello Moores  Register   On: 07/21/2016 07:28   Dg Chest Portable 1 View  Result Date: 07/20/2016 CLINICAL DATA:  Shortness of breath.  Lower extremity edema. EXAM: PORTABLE CHEST 1 VIEW COMPARISON:  03/11/2013 FINDINGS: Mild cardiomegaly. Atherosclerosis of the thoracic aorta. Heterogeneous bibasilar opacities are increased from prior exam. Mild vascular congestion. Possible blunting of the costophrenic angles. No pneumothorax. The bones appear under mineralized. IMPRESSION: Mild cardiomegaly in vascular congestion. Heterogeneous bibasilar opacities may be atelectasis, pneumonia or pulmonary edema. Thoracic aortic atherosclerosis. Electronically Signed   By: Jeb Levering M.D.   On: 07/20/2016 21:42    Cardiac Studies    Echo prelim: LVEF 60-65%.  Severe RV hypokinesis.  RV dilated.  Grade 1 diastolic dysfunction.  RA pressure 8 mmHg.   Patient Profile     Deanna Johnston is a 17F with CAD s/p RCA PCI and MI, hypertenison, hyperlipidemia, COPD and tobacco abuse here with acute heart failure type unknown, hypoxic and hypercarbic respiratory failure.  Assessment & Plan    # Cardiogenic shock: # Severe RV failure: # Hypoxic and hypercarbic respiratory failure: Deanna Johnston appears to be in severe right heart failure  With cardiogenic shock requiring dopamine. Diuresis has picked up this morning with Lasix drip. On my review of her echocardiogram it appears that her RV is barely moving and is dilated. This raises concern for pulmonary embolism. We will check a d-dimer. If elevated she will need a CT-A of the chest. We will defer on this at this time given her renal function.  For now, continue diuresis and dopamine for cardiac output support.  # CAD s/p remote MI: She is not currently complaining of chest pain and cardiac enzymes are negative. There is no evidence of ischemia on her EKG. Defer cardiac ischemia evaluation at this time.   Continue aspirin. She has an allergy to rosuvastatin. We will check lipids. Her home metoprolol and valsartan are held due to cardiogenic shock.   # AKI: Improving with diuresis and dopamine.    Time spent: 60 minutes-Greater than 50% of this time was spent in counseling, explanation of diagnosis, planning of further management, and coordination of care.   Signed, Skeet Latch, MD  07/21/2016, 8:03 AM

## 2016-07-21 NOTE — Progress Notes (Signed)
ANTICOAGULATION CONSULT NOTE - Initial Consult  Pharmacy Consult for heparin Indication: empiric treatment for possible PE  Allergies  Allergen Reactions  . Crestor [Rosuvastatin Calcium]     intolerant  . Prednisone     "just makes me sick"  . Sulfa Antibiotics Swelling  . Thyroid Hormones Other (See Comments)    Paranoid   . Niaspan [Niacin] Nausea Only    nausea    Patient Measurements: Height: 4\' 10"  (147.3 cm) Weight: 179 lb 3.7 oz (81.3 kg) IBW/kg (Calculated) : 40.9 Heparin Dosing Weight: 80kg  Vital Signs: Temp: 97.8 F (36.6 C) (06/08 0700) Temp Source: Oral (06/08 0700) BP: 120/99 (06/08 0800) Pulse Rate: 79 (06/08 0800)  Labs:  Recent Labs  07/20/16 2107 07/21/16 0101 07/21/16 0410 07/21/16 0716  HGB 17.2*  --   --   --   HCT 54.7*  --   --   --   PLT 239  --   --   --   CREATININE 1.79* 1.69* 1.53* 1.51*  TROPONINI  --  0.06* 0.06*  --     Estimated Creatinine Clearance: 29.9 mL/min (A) (by C-G formula based on SCr of 1.51 mg/dL (H)).   Medical History: Past Medical History:  Diagnosis Date  . Cataract   . Cervical dysplasia   . COPD (chronic obstructive pulmonary disease) ([place])   . Coronary artery disease   . Hyperlipidemia   . MI (myocardial infarction) [facility]) Dec. 2011   stent mid right coronary  . Obesity   . Tobacco dependence   . VAIN (vaginal intraepithelial neoplasia) 2011   Efudex treatment    Assessment: 71 year old female with PMH of COPD (Active smoker, 40-50 year 2 ppd, reports cut back to 1/2 ppd), CAD s/p stents in 2011, HLD, MI.  Patient noted to have significant RV failure on echo this am, concern for PE. D-dimer 1.9, CTA pending. Will start empiric heparin.  Goal of Therapy:  Heparin level 0.3-0.7 units/ml Monitor platelets by anticoagulation protocol: Yes   Plan:  Give 5000 units bolus x 1 Start heparin infusion at 1200 units/hr Check anti-Xa level in 8 hours and daily while on heparin Continue to monitor H&H  and platelets  Erin Hearing PharmD., BCPS Clinical Pharmacist Pager [phone] 07/21/2016 11:07 AM

## 2016-07-21 NOTE — Procedures (Signed)
Arterial Catheter Insertion Procedure Note Deanna Johnston [id number] Aug 22, 1945  Procedure: Insertion of Arterial Catheter  Indications: Blood pressure monitoring  Procedure Details Consent: Risks of procedure as well as the alternatives and risks of each were explained to the (patient/caregiver).  Consent for procedure obtained. Time Out: Verified patient identification, verified procedure, site/side was marked, verified correct patient position, special equipment/implants available, medications/allergies/relevent history reviewed, required imaging and test results available.  Performed  Maximum sterile technique was used including antiseptics, cap, gloves, gown, hand hygiene, mask and sheet. Skin prep: Chlorhexidine; local anesthetic administered 20 gauge catheter was inserted into left radial artery using the Seldinger technique.  Evaluation Blood flow good; BP tracing good. Complications: No apparent complications.   Deanna Johnston 07/21/2016

## 2016-07-21 NOTE — Progress Notes (Signed)
*  PRELIMINARY RESULTS* Vascular Ultrasound Bilateral lower extremity venous duplex has been completed.  Preliminary findings: No evidence of deep vein thrombosis in the visualized veins of the lower extremities.  Negative for baker's cysts bilaterally.   Everrett Coombe 07/21/2016, 11:34 AM

## 2016-07-21 NOTE — Plan of Care (Signed)
Problem: Fluid Volume: Goal: Ability to maintain a balanced intake and output will improve Outcome: Progressing Lasix; diuresing  Problem: Nutrition: Goal: Adequate nutrition will be maintained Outcome: Progressing Diet advanced to carb/card; good appetite  Problem: Activity: Goal: Capacity to carry out activities will improve Outcome: Progressing Bathed; up in chair for >2h

## 2016-07-21 NOTE — Progress Notes (Signed)
RT placed patient on BIPAP due to low Sp02 and patient fatigue. Patient is tolerating well at this time with spO2 88%-95% on FIO2 70%. Patient resting comfortably at this time. RT will monitor as needed.

## 2016-07-21 NOTE — Care Management Note (Signed)
Case Management Note Marvetta Gibbons RN, BSN Unit 2W-Case Manager-- [place] coverage [phone]  Patient Details  Name: [name] MRN: [id number] Date of Birth: 1945-05-20  Subjective/Objective:  Pt adamittd with acute on chronic HF-- currently on lasix drip and IV dopamine                  Action/Plan: PTA pt lived at home- PCP- Kelton Pillar,  [place] to follow   Expected Discharge Date:                  Expected Discharge Plan:     In-House Referral:     Discharge planning Services  CM Consult  Post Acute Care Choice:    Choice offered to:     DME Arranged:    DME Agency:     HH Arranged:    [place] Agency:     Status of Service:  In process, will continue to follow  If discussed at Long Length of Stay Meetings, dates discussed:    Additional Comments:  Dawayne Patricia, RN 07/21/2016, 9:55 AM

## 2016-07-21 NOTE — Progress Notes (Signed)
Left radial arterial line occluded, leaking and infiltrated; arterial line removed; MD notified; verbal order given to manage pressors with cuff pressures and revisit arterial line necessity later. Will continue to monitor.  Clyda Hurdle RN

## 2016-07-21 NOTE — Progress Notes (Signed)
Patient noted to have q4h CBG; no insulin coverage; Patient with good appetite and Carb/Card diet.  Denies history of DM.  Glucose at 1549 was 183; Glucose at approximately 1930 was 191;  MD notified; coverage ordered. Will continue to monitor  Clyda Hurdle RN

## 2016-07-21 NOTE — Progress Notes (Signed)
Repeat ABG trending in right direction; critical value pCO2 of 72 called to RT and eLink. No new orders received at this time. Will continue to closely monitor.  Sherlie Ban, RN

## 2016-07-21 NOTE — Progress Notes (Signed)
[facility] Progress Note Patient Name: Deanna Johnston DOB: June 12, 1945 MRN: [id number]   Date of Service  07/21/2016  HPI/Events of Note  Hyperglycemia - Blood glucose = 190's.  eICU Interventions  Will order: 1. AC and HS sensitive Novolog SSI.     Intervention Category Major Interventions: Hyperglycemia - active titration of insulin therapy  Lysle Dingwall 07/21/2016, 7:42 PM

## 2016-07-21 NOTE — Progress Notes (Signed)
Patient transported to CT and back to room 2H07, on bipap, without complications.

## 2016-07-21 NOTE — Progress Notes (Signed)
Patient's SpO2 goal is >88. Patient is stable at this time. BIPAP is not needed. RT will monitor as needed.

## 2016-07-22 ENCOUNTER — Inpatient Hospital Stay (HOSPITAL_COMMUNITY): Payer: Medicare Other

## 2016-07-22 DIAGNOSIS — I50813 Acute on chronic right heart failure: Secondary | ICD-10-CM

## 2016-07-22 LAB — CBC
HCT: 53.2 % — ABNORMAL HIGH (ref 36.0–46.0)
Hemoglobin: 16.8 g/dL — ABNORMAL HIGH (ref 12.0–15.0)
MCH: 29.5 pg (ref 26.0–34.0)
MCHC: 31.6 g/dL (ref 30.0–36.0)
MCV: 93.3 fL (ref 78.0–100.0)
PLATELETS: 235 10*3/uL (ref 150–400)
RBC: 5.7 MIL/uL — ABNORMAL HIGH (ref 3.87–5.11)
RDW: 15 % (ref 11.5–15.5)
WBC: 13 10*3/uL — AB (ref 4.0–10.5)

## 2016-07-22 LAB — URINE CULTURE: CULTURE: NO GROWTH

## 2016-07-22 LAB — BASIC METABOLIC PANEL
Anion gap: 8 (ref 5–15)
BUN: 47 mg/dL — AB (ref 6–20)
CALCIUM: 8.2 mg/dL — AB (ref 8.9–10.3)
CO2: 33 mmol/L — ABNORMAL HIGH (ref 22–32)
CREATININE: 1.21 mg/dL — AB (ref 0.44–1.00)
Chloride: 97 mmol/L — ABNORMAL LOW (ref 101–111)
GFR calc Af Amer: 50 mL/min — ABNORMAL LOW (ref >60)
GFR, EST NON AFRICAN AMERICAN: 43 mL/min — AB (ref >60)
Glucose, Bld: 137 mg/dL — ABNORMAL HIGH (ref 65–99)
Potassium: 4.5 mmol/L (ref 3.5–5.1)
SODIUM: 138 mmol/L (ref 135–145)

## 2016-07-22 LAB — GLUCOSE, CAPILLARY
GLUCOSE-CAPILLARY: 205 mg/dL — AB (ref 65–99)
GLUCOSE-CAPILLARY: 175 mg/dL — AB (ref 65–99)
Glucose-Capillary: 153 mg/dL — ABNORMAL HIGH (ref 65–99)
Glucose-Capillary: 166 mg/dL — ABNORMAL HIGH (ref 65–99)

## 2016-07-22 LAB — PHOSPHORUS: PHOSPHORUS: 4.3 mg/dL (ref 2.5–4.6)

## 2016-07-22 LAB — PROCALCITONIN

## 2016-07-22 LAB — MAGNESIUM: Magnesium: 2.1 mg/dL (ref 1.7–2.4)

## 2016-07-22 MED ORDER — IPRATROPIUM-ALBUTEROL 0.5-2.5 (3) MG/3ML IN SOLN
3.0000 mL | Freq: Three times a day (TID) | RESPIRATORY_TRACT | Status: DC
  Administered 2016-07-23 – 2016-07-28 (×15): 3 mL via RESPIRATORY_TRACT
  Filled 2016-07-22 (×15): qty 3

## 2016-07-22 MED ORDER — NICOTINE 14 MG/24HR TD PT24
14.0000 mg | MEDICATED_PATCH | Freq: Every day | TRANSDERMAL | Status: DC
  Administered 2016-07-22 – 2016-07-24 (×3): 14 mg via TRANSDERMAL
  Filled 2016-07-22 (×4): qty 1

## 2016-07-22 NOTE — Progress Notes (Signed)
Patient resting comfortably on 6L HFNC. No respiratory distress noted. BIPAP not needed at this time. RT will monitor as needed.

## 2016-07-22 NOTE — Progress Notes (Signed)
[id number] PULMONARY / CRITICAL CARE MEDICINE   Name: Deanna Johnston MRN: [id number] DOB: February 01, 1946    ADMISSION DATE:  07/20/2016 CONSULTATION DATE:  07/21/2016  REFERRING MD:  Dr. Jori Moll   CHIEF COMPLAINT:  Dyspnea   HISTORY OF PRESENT ILLNESS:   71 year old female with PMH of COPD (Active smoker, 40-50 year 2 ppd, reports cut back to 1/2 ppd), CAD s/p stents in 2011, HLD, MI. Reports at baseline is SOB with chronic cough.   Presents to ED on 6/7 with progressive dyspnea for the last 2 weeks with increased swelling to BLE. Upon arrival to ED BNP 1113.9, WBC 11.6, Lactic 1.45, ABG 7.3/60/63. Hypotensive with systolic 72-53, oxygenation 90s on 4L [place], received 500 ml bolus and Rocephin/Azithromycin. Cardiology was consulted who recommended diuresis. PCCM asked to admit.    SUBJECTIVE:   CTA does not show PE and heparin was stopped Still on HFNC. Did not need bipap overnight In chair today AM  VITAL SIGNS: BP (!) 124/56 (BP Location: Left Arm)   Pulse 81   Temp 98.3 F (36.8 C) (Oral)   Resp (!) 21   Ht 4\' 10"  (1.473 m)   Wt 176 lb 9.4 oz (80.1 kg)   SpO2 94%   BMI 36.91 kg/m   HEMODYNAMICS:    VENTILATOR SETTINGS:    INTAKE / OUTPUT: I/O last 3 completed shifts: In: 2396.8 [P.O.:600; I.V.:1196.8; IV Piggyback:600] Out: 5510 [Urine:5510]  PHYSICAL EXAMINATION:. Gen:      No acute distress HEENT:  EOMI, sclera anicteric Neck:     No masses; no thyromegaly Lungs:    Basal crackles; normal respiratory effort CV:         Regular rate and rhythm; no murmurs Abd:      + bowel sounds; soft, non-tender; no palpable masses, no distension Ext:    2-3 + edema; adequate peripheral perfusion Skin:      Elbow wound, Rt foot blister Neuro: alert and oriented x 3  LABS:  BMET  Recent Labs Lab 07/21/16 1740 07/21/16 2329 07/22/16 0415  NA 137 137 138  K 4.8 4.7 4.5  CL 97* 94* 97*  CO2 32 34* 33*  BUN 57* 53* 47*  CREATININE 1.39* 1.52* 1.21*  GLUCOSE 161* 126* 137*     Electrolytes  Recent Labs Lab 07/21/16 0101  07/21/16 1740 07/21/16 2329 07/22/16 0415  CALCIUM 8.3*  < > 8.0* 8.4* 8.2*  MG 2.5*  --   --   --  2.1  PHOS 5.0*  --   --   --  4.3  < > = values in this interval not displayed.  CBC  Recent Labs Lab 07/20/16 2107 07/22/16 0415  WBC 11.6* 13.0*  HGB 17.2* 16.8*  HCT 54.7* 53.2*  PLT 239 235    Coag's No results for input(s): APTT, INR in the last 168 hours.  Sepsis Markers  Recent Labs Lab 07/20/16 2244 07/21/16 0101 07/21/16 0410 07/22/16 0415  LATICACIDVEN 1.45  --   --   --   PROCALCITON  --  <0.10 <0.10 <0.10    ABG  Recent Labs Lab 07/21/16 0304 07/21/16 0419 07/21/16 1040  PHART 7.199* 7.254* 7.292*  PCO2ART 77.7* 72.0* 67.1*  PO2ART 58.0* 62.0* 65.0*    Liver Enzymes No results for input(s): AST, ALT, ALKPHOS, BILITOT, ALBUMIN in the last 168 hours.  Cardiac Enzymes  Recent Labs Lab 07/21/16 0101 07/21/16 0410 07/21/16 1057  TROPONINI 0.06* 0.06* 0.06*    Glucose  Recent Labs  Lab 07/21/16 1136 07/21/16 1549 07/21/16 1931 07/21/16 2228 07/22/16 0724 07/22/16 1134  GLUCAP 111* 183* 191* 127* 153* 205*    Imaging Dg Chest Port 1 View  Result Date: 07/22/2016 CLINICAL DATA:  71 year old female with respiratory failure, congestive heart failure. Two weeks of progressive shortness of breath and lower extremity swelling. Undergoing diuresis. EXAM: PORTABLE CHEST 1 VIEW COMPARISON:  Chest CTA 07/21/2016 and earlier FINDINGS: Portable AP semi upright view at at 0458 hours. Chest CTA yesterday demonstrated dependent opacity and small bilateral pleural effusions. Pulmonary vascular congestion appears mildly increased compared to 07/20/2016. Bibasilar opacity is stable since yesterday. No pneumothorax. Stable cardiomegaly and mediastinal contours. Calcified aortic atherosclerosis. Visualized tracheal air column is within normal limits. IMPRESSION: 1. Pulmonary vascular congestion appears  mildly worsened since 07/20/2016. 2. Stable patchy bibasilar opacity since yesterday favored to be atelectasis. Electronically Signed   By: Genevie Ann M.D.   On: 07/22/2016 07:33     STUDIES:  CXR 6/7 > Mild cardiomegaly in vascular congestion. Heterogeneous bibasilar opacities may be atelectasis, PNA, or pulmonary edema  ECHO 6/8 > EF 65-70%, G1DD, mod to severely reduced RV systolic function. CTA chest 6/8 >  LE dupex 3/8 >  CULTURES: Blood 6/7 > Sputum 6/8 >   ANTIBIOTICS: Azithromycin 6/7 > 6/8 Rocephin 6/7 >    SIGNIFICANT EVENTS: 6/7 > Presents to ED SOB  LINES/TUBES: None.  DISCUSSION: 71 year old female presents to ED with reported 2 weeks of progressive dyspnea and BLE swelling. BNP 1113.9. Cardiology following. 6/8 > concern for PE, started on heparin empirically while awaiting LE duplex and CTA chest.  ASSESSMENT / PLAN:  Acute on Chronic Hypoxic/Hypercarbic Respiratory Failure. Possible AECOPD. Concern for PE / DVT - worsening SOB, LE edema.  D-dimer elevated and echo with dilated and weak RV. ? OSA. Tobacco dependence. P:   Wean down O2 as tolerated Bipap PRN Continue ceftriaxone Continue duonebs Sleep study as outpatient.  Shock - likely multifactorial from RV failure (Echo 07/21/16 with EF 65-70%, G1DD, mod to severely reduced RV systolic function).  Another possibility includes chronic RV failure due to underlying lung disease with cor pulmonale. H/O CAD s/p stenting, HLD. P:  Cardiology Following.  Contineu IV lasix Wean off levophed. Will need CVL access if unable to get off. Hold home Lopressor and Valsartan. Continue aspirin  AKI. Volume Overload - improving gradually. P:   Continue lasix. Replete lytes  FAMILY  - Updates: Family updated at bedside on admission 6/8. Patient states that she would like [name] (Niece) to be HCPOA [phone] in the event she can not make her own decisions  No family at bedside 6/9  - Inter-disciplinary  family meet or Palliative Care meeting due by:  07/28/2016  The patient is critically ill with multiple organ system failure and requires high complexity decision making for assessment and support, frequent evaluation and titration of therapies, advanced monitoring, review of radiographic studies and interpretation of complex data.   Critical Care Time devoted to patient care services, exclusive of separately billable procedures, described in this note is 35 minutes.   Marshell Garfinkel MD [facility] Pager [phone] If no answer or after 3pm call: [phone] 07/22/2016, 12:43 PM

## 2016-07-22 NOTE — Procedures (Signed)
Arterial Catheter Insertion Procedure Note Deanna Johnston [id number] 05-28-1945  Procedure: Insertion of Arterial Catheter  Indications: Blood pressure monitoring  Procedure Details Consent: Risks of procedure as well as the alternatives and risks of each were explained to the (patient/caregiver).  Consent for procedure obtained. Time Out: Verified patient identification, verified procedure, site/side was marked, verified correct patient position, special equipment/implants available, medications/allergies/relevent history reviewed, required imaging and test results available.  Performed  Maximum sterile technique was used including antiseptics, cap, gloves, gown, hand hygiene, mask and sheet. Skin prep: Chlorhexidine; local anesthetic administered 20 gauge catheter was inserted into right radial artery using the Seldinger technique.  Evaluation Blood flow good; BP tracing good. Complications: No apparent complications.   Deanna Johnston 07/22/2016

## 2016-07-22 NOTE — Progress Notes (Signed)
Spoke with Dr. Vaughan Browner on rounds about obtaining better IV access. Levophed has currently been infusing in a small PIV for ~24 hours. States to continue trying to wean Levophed off and he will reassess the need for better access tomorrow.   Vena [place]

## 2016-07-22 NOTE — Progress Notes (Signed)
Progress Note  Patient Name: Deanna Johnston Date of Encounter: 07/22/2016  Primary Cardiologist: Dr. Martinique   Subjective   Still with dyspnea, no chest pain, no palpitations.  Inpatient Medications    Scheduled Meds: . aspirin EC  81 mg Oral Daily  . furosemide  40 mg Intravenous Q12H  . insulin aspart  0-5 Units Subcutaneous QHS  . insulin aspart  0-9 Units Subcutaneous TID WC  . ipratropium-albuterol  3 mL Nebulization Q6H  . mouth rinse  15 mL Mouth Rinse BID   Continuous Infusions: . sodium chloride Stopped (07/21/16 1900)  . sodium chloride    . cefTRIAXone (ROCEPHIN)  IV Stopped (07/21/16 2309)  . norepinephrine (LEVOPHED) Adult infusion 15.013 mcg/min (07/22/16 0600)   PRN Meds: sodium chloride, Place/Maintain arterial line **AND** sodium chloride   Vital Signs    Vitals:   07/22/16 0700 07/22/16 0701 07/22/16 0718 07/22/16 0726  BP: (!) 137/104 (!) 119/44    Pulse: 100 84    Resp: (!) 28 (!) 28    Temp:    98.2 F (36.8 C)  TempSrc:    Oral  SpO2: (!) 88% 92% 90%   Weight:      Height:        Intake/Output Summary (Last 24 hours) at 07/22/16 0801 Last data filed at 07/22/16 0600  Gross per 24 hour  Intake          1742.25 ml  Output             4775 ml  Net         -3032.75 ml   Filed Weights   07/21/16 0045 07/21/16 0400 07/22/16 0630  Weight: 179 lb 3.7 oz (81.3 kg) 179 lb 3.7 oz (81.3 kg) 176 lb 9.4 oz (80.1 kg)    Telemetry    Sinus rhythm. - Personally Reviewed  ECG    Normal sinus rhythm, left axis deviation, nonspecific T wave abnormality. - Personally Reviewed  Physical Exam   GEN: No acute distress.  BiPAP removed Neck:  8 cm JVD Cardiac: RRR, no murmurs, rubs, or gallops.  Respiratory:  scattered rales, decreased lung sounds, no wheezes. GI: Soft, nontender, non-distended  MS: No edema; No deformity. Neuro:  Nonfocal  Psych: Normal affect  Extremities: 1+ peripheral edema bilaterally  Labs    Chemistry Recent  Labs Lab 07/21/16 1740 07/21/16 2329 07/22/16 0415  NA 137 137 138  K 4.8 4.7 4.5  CL 97* 94* 97*  CO2 32 34* 33*  GLUCOSE 161* 126* 137*  BUN 57* 53* 47*  CREATININE 1.39* 1.52* 1.21*  CALCIUM 8.0* 8.4* 8.2*  GFRNONAA 37* 33* 43*  GFRAA 42* 38* 50*  ANIONGAP 8 9 8      Hematology Recent Labs Lab 07/20/16 2107 07/22/16 0415  WBC 11.6* 13.0*  RBC 5.83* 5.70*  HGB 17.2* 16.8*  HCT 54.7* 53.2*  MCV 93.8 93.3  MCH 29.5 29.5  MCHC 31.4 31.6  RDW 15.2 15.0  PLT 239 235    Cardiac Enzymes Recent Labs Lab 07/21/16 0101 07/21/16 0410 07/21/16 1057  TROPONINI 0.06* 0.06* 0.06*    Recent Labs Lab 07/20/16 2117  TROPIPOC 0.06     BNP Recent Labs Lab 07/20/16 2107  BNP 1,113.9*     DDimer  Recent Labs Lab 07/21/16 0843  DDIMER 1.91*     Radiology    Ct Angio Chest Pe W Or Wo Contrast  Result Date: 07/21/2016 CLINICAL DATA:  Hypoxia, elevated D-dimer level. EXAM: CT  ANGIOGRAPHY CHEST WITH CONTRAST TECHNIQUE: Multidetector CT imaging of the chest was performed using the standard protocol during bolus administration of intravenous contrast. Multiplanar CT image reconstructions and MIPs were obtained to evaluate the vascular anatomy. CONTRAST:  100 mL of Isovue 370 intravenously. COMPARISON:  CT scan of February 04, 2010. FINDINGS: Cardiovascular: Satisfactory opacification of the pulmonary arteries to the segmental level. No evidence of pulmonary embolism. Normal heart size. No pericardial effusion. Atherosclerosis of thoracic aorta is noted without aneurysm formation. Enlargement of pulmonary arteries is noted suggesting pulmonary artery hypertension. Coronary artery calcifications are noted. Mediastinum/Nodes: No enlarged mediastinal, hilar, or axillary lymph nodes. Thyroid gland, trachea, and esophagus demonstrate no significant findings. Lungs/Pleura: Minimal bilateral pleural effusions are noted with adjacent subsegmental atelectasis. No pneumothorax is noted.  Right middle lobe atelectasis or infiltrate is noted. Upper Abdomen: No acute abnormality. Musculoskeletal: No chest wall abnormality. No acute or significant osseous findings. Multilevel degenerative disc disease is noted in the thoracic spine. Review of the MIP images confirms the above findings. IMPRESSION: No definite evidence of pulmonary embolus. Aortic atherosclerosis. Coronary artery calcifications are noted suggesting coronary artery disease. Enlargement of pulmonary arteries are noted suggesting pulmonary artery hypertension. Minimal bilateral pleural effusions are noted with adjacent subsegmental atelectasis. Right middle lobe atelectasis or pneumonia is noted as well. Electronically Signed   By: Marijo Conception, M.D.   On: 07/21/2016 12:35   Dg Chest Port 1 View  Result Date: 07/22/2016 CLINICAL DATA:  71 year old female with respiratory failure, congestive heart failure. Two weeks of progressive shortness of breath and lower extremity swelling. Undergoing diuresis. EXAM: PORTABLE CHEST 1 VIEW COMPARISON:  Chest CTA 07/21/2016 and earlier FINDINGS: Portable AP semi upright view at at 0458 hours. Chest CTA yesterday demonstrated dependent opacity and small bilateral pleural effusions. Pulmonary vascular congestion appears mildly increased compared to 07/20/2016. Bibasilar opacity is stable since yesterday. No pneumothorax. Stable cardiomegaly and mediastinal contours. Calcified aortic atherosclerosis. Visualized tracheal air column is within normal limits. IMPRESSION: 1. Pulmonary vascular congestion appears mildly worsened since 07/20/2016. 2. Stable patchy bibasilar opacity since yesterday favored to be atelectasis. Electronically Signed   By: Genevie Ann M.D.   On: 07/22/2016 07:33   Dg Chest Port 1 View  Result Date: 07/21/2016 CLINICAL DATA:  Shortness of breath. EXAM: PORTABLE CHEST 1 VIEW COMPARISON:  07/20/2016 . FINDINGS: Cardiomegaly. Diffuse bilateral from interstitial prominence with  bilateral pleural effusions. Findings consistent with CHF. Bibasilar pneumonia cannot be excluded. No pneumothorax P IMPRESSION: Congestive heart failure bibasilar pulmonary interstitial edema and small pleural effusions. Electronically Signed   By: Marcello Moores  Register   On: 07/21/2016 07:28   Dg Chest Portable 1 View  Result Date: 07/20/2016 CLINICAL DATA:  Shortness of breath.  Lower extremity edema. EXAM: PORTABLE CHEST 1 VIEW COMPARISON:  03/11/2013 FINDINGS: Mild cardiomegaly. Atherosclerosis of the thoracic aorta. Heterogeneous bibasilar opacities are increased from prior exam. Mild vascular congestion. Possible blunting of the costophrenic angles. No pneumothorax. The bones appear under mineralized. IMPRESSION: Mild cardiomegaly in vascular congestion. Heterogeneous bibasilar opacities may be atelectasis, pneumonia or pulmonary edema. Thoracic aortic atherosclerosis. Electronically Signed   By: Jeb Levering M.D.   On: 07/20/2016 21:42    Cardiac Studies   2-D echo demonstrates preserved left ventricular systolic function, with severe RV dysfunction.  Patient Profile     71 y.o. female with CAD status post PCI, hypertension, dyslipidemia, and ongoing tobacco abuse, admitted with congestive heart failure, right-sided greater than left and respiratory failure.  Assessment & Plan  1. Severe right heart failure - she will continue levo fed for now, but hopefully this can be weaned throughout the day today. CT scan of the chest yesterday demonstrated no evidence of pulmonary embolism. 2. Respiratory failure - hypoxic and hypercarbic. We'll continue IV diuresis. 3. Coronary artery disease status post remote MI - there is no evidence of active or ongoing ischemia. We'll continue aspirin. She will continue to hold her beta blocker and ARB until levo fed is weaned off. 4. Acute on chronic renal failure, stage III - her kidney function is actually better today with the creatinine down from 1.5 to  1.2. Hopefully this trend will continue.  Signed, Cristopher Peru, MD  07/22/2016, 8:01 AM  Patient ID: Deanna Johnston, female   DOB: 12-19-1945, 71 y.o.   MRN: [id number]

## 2016-07-22 NOTE — Plan of Care (Signed)
Problem: Physical Regulation: Goal: Ability to maintain clinical measurements within normal limits will improve Outcome: Progressing Down to 10 mcg levo  Problem: Fluid Volume: Goal: Ability to maintain a balanced intake and output will improve Outcome: Progressing Lasix; diuresing   Problem: Nutrition: Goal: Adequate nutrition will be maintained Outcome: Progressing Good appetite; no nausea  Problem: Activity: Goal: Capacity to carry out activities will improve Outcome: Progressing Up in chair all day without any SOB or distress

## 2016-07-23 ENCOUNTER — Inpatient Hospital Stay (HOSPITAL_COMMUNITY): Payer: Medicare Other

## 2016-07-23 DIAGNOSIS — I5023 Acute on chronic systolic (congestive) heart failure: Secondary | ICD-10-CM

## 2016-07-23 LAB — GLUCOSE, CAPILLARY
Glucose-Capillary: 174 mg/dL — ABNORMAL HIGH (ref 65–99)
Glucose-Capillary: 157 mg/dL — ABNORMAL HIGH (ref 65–99)
Glucose-Capillary: 140 mg/dL — ABNORMAL HIGH (ref 65–99)
Glucose-Capillary: 130 mg/dL — ABNORMAL HIGH (ref 65–99)

## 2016-07-23 LAB — BASIC METABOLIC PANEL
Anion gap: 7 (ref 5–15)
BUN: 24 mg/dL — ABNORMAL HIGH (ref 6–20)
CHLORIDE: 93 mmol/L — AB (ref 101–111)
CO2: 39 mmol/L — ABNORMAL HIGH (ref 22–32)
CREATININE: 0.99 mg/dL (ref 0.44–1.00)
Calcium: 8.1 mg/dL — ABNORMAL LOW (ref 8.9–10.3)
GFR calc Af Amer: >60 mL/min (ref >60)
GFR calc non Af Amer: 55 mL/min — ABNORMAL LOW (ref >60)
Glucose, Bld: 130 mg/dL — ABNORMAL HIGH (ref 65–99)
POTASSIUM: 4.3 mmol/L (ref 3.5–5.1)
SODIUM: 139 mmol/L (ref 135–145)

## 2016-07-23 LAB — CBC
HEMATOCRIT: 53.9 % — AB (ref 36.0–46.0)
HEMOGLOBIN: 16.5 g/dL — AB (ref 12.0–15.0)
MCH: 29 pg (ref 26.0–34.0)
MCHC: 30.6 g/dL (ref 30.0–36.0)
MCV: 94.9 fL (ref 78.0–100.0)
Platelets: 199 10*3/uL (ref 150–400)
RBC: 5.68 MIL/uL — ABNORMAL HIGH (ref 3.87–5.11)
RDW: 14.9 % (ref 11.5–15.5)
WBC: 13.3 10*3/uL — AB (ref 4.0–10.5)

## 2016-07-23 LAB — MAGNESIUM: MAGNESIUM: 1.7 mg/dL (ref 1.7–2.4)

## 2016-07-23 LAB — PHOSPHORUS: Phosphorus: 3 mg/dL (ref 2.5–4.6)

## 2016-07-23 MED ORDER — SODIUM CHLORIDE 0.9% FLUSH
10.0000 mL | Freq: Two times a day (BID) | INTRAVENOUS | Status: DC
  Administered 2016-07-23 – 2016-07-26 (×4): 10 mL
  Administered 2016-07-27: 20 mL

## 2016-07-23 MED ORDER — CHLORHEXIDINE GLUCONATE CLOTH 2 % EX PADS
6.0000 | MEDICATED_PAD | Freq: Every day | CUTANEOUS | Status: DC
  Administered 2016-07-23 – 2016-07-28 (×6): 6 via TOPICAL

## 2016-07-23 MED ORDER — SODIUM CHLORIDE 0.9% FLUSH
10.0000 mL | INTRAVENOUS | Status: DC | PRN
  Administered 2016-07-28: 10 mL
  Administered 2016-07-29: 30 mL
  Filled 2016-07-23 (×2): qty 40

## 2016-07-23 NOTE — Progress Notes (Signed)
Consent obtained. Pt up in chair eating lunch.  States will do after return to bed.  Rn notified.

## 2016-07-23 NOTE — Progress Notes (Signed)
Peripherally Inserted Central Catheter/Midline Placement  The IV Nurse has discussed with the patient and/or persons authorized to consent for the patient, the purpose of this procedure and the potential benefits and risks involved with this procedure.  The benefits include less needle sticks, lab draws from the catheter, and the patient may be discharged home with the catheter. Risks include, but not limited to, infection, bleeding, blood clot (thrombus formation), and puncture of an artery; nerve damage and irregular heartbeat and possibility to perform a PICC exchange if needed/ordered by physician.  Alternatives to this procedure were also discussed.  Bard Power PICC patient education guide, fact sheet on infection prevention and patient information card has been provided to patient /or left at bedside.    PICC/Midline Placement Documentation  PICC Single Lumen 51/89/84 PICC Right Basilic 35 cm (Active)  Indication for Insertion or Continuance of Line Limited venous access - need for IV therapy >5 days (PICC only) 07/23/2016  2:00 PM  Exposed Catheter (cm) 0 cm 07/23/2016  2:00 PM  Site Assessment Clean;Dry;Intact 07/23/2016  2:00 PM  Line Status Flushed;Blood return noted;Saline locked 07/23/2016  2:00 PM  Dressing Type Transparent 07/23/2016  2:00 PM  Dressing Status Clean;Dry;Intact;Antimicrobial disc in place 07/23/2016  2:00 PM  Line Care Connections checked and tightened 07/23/2016  2:00 PM  Line Adjustment (NICU/IV Team Only) No 07/23/2016  2:00 PM  Dressing Intervention New dressing 07/23/2016  2:00 PM  Dressing Change Due 07/30/16 07/23/2016  2:00 PM       Deanna Johnston 07/23/2016, 2:41 PM

## 2016-07-23 NOTE — Progress Notes (Signed)
Patient's vitals are stable. Patient is resting comfortably on 8L HFNC. BIPAP is not needed at this time. RT will monitor as needed.

## 2016-07-23 NOTE — Progress Notes (Signed)
Progress Note  Patient Name: Deanna Johnston Date of Encounter: 07/23/2016  Primary Cardiologist: Dr. Martinique  Subjective   She appears to be but has dyspnea. No chest pain  Inpatient Medications    Scheduled Meds: . aspirin EC  81 mg Oral Daily  . furosemide  40 mg Intravenous Q12H  . insulin aspart  0-5 Units Subcutaneous QHS  . insulin aspart  0-9 Units Subcutaneous TID WC  . ipratropium-albuterol  3 mL Nebulization TID  . mouth rinse  15 mL Mouth Rinse BID  . nicotine  14 mg Transdermal Daily   Continuous Infusions: . sodium chloride Stopped (07/21/16 1900)  . sodium chloride    . cefTRIAXone (ROCEPHIN)  IV Stopped (07/22/16 2146)  . norepinephrine (LEVOPHED) Adult infusion 13.013 mcg/min (07/23/16 0700)   PRN Meds: sodium chloride, Place/Maintain arterial line **AND** sodium chloride   Vital Signs    Vitals:   07/23/16 0700 07/23/16 0752 07/23/16 0800 07/23/16 0813  BP: (!) 121/105  111/74   Pulse: 92  (!) 103   Resp: (!) 32  (!) 41   Temp:  98.7 F (37.1 C)    TempSrc:  Oral    SpO2: 93%  92% 92%  Weight:      Height:        Intake/Output Summary (Last 24 hours) at 07/23/16 0851 Last data filed at 07/23/16 0800  Gross per 24 hour  Intake          1759.93 ml  Output             4300 ml  Net         -2540.07 ml   Filed Weights   07/21/16 0400 07/22/16 0630 07/23/16 0545  Weight: 179 lb 3.7 oz (81.3 kg) 176 lb 9.4 oz (80.1 kg) 173 lb 11.6 oz (78.8 kg)    Telemetry    Normal sinus rhythm - Personally Reviewed  ECG    None today - Personally Reviewed  Physical Exam   GEN: No acute distress,Chronically ill-appearing   Neck:  8 cm JVD Cardiac: RRR, no murmurs, rubs, or gallops.  Respiratory:  decreased breath sounds throughout with scattered rales no wheezes GI: Soft, nontender, non-distended  MS:  trace peripheral edema; No deformity. Neuro:  Nonfocal  Psych: Normal affect   Labs    Chemistry Recent Labs Lab 07/21/16 2329  07/22/16 0415 07/23/16 0445  NA 137 138 139  K 4.7 4.5 4.3  CL 94* 97* 93*  CO2 34* 33* 39*  GLUCOSE 126* 137* 130*  BUN 53* 47* 24*  CREATININE 1.52* 1.21* 0.99  CALCIUM 8.4* 8.2* 8.1*  GFRNONAA 33* 43* 55*  GFRAA 38* 50* >60  ANIONGAP 9 8 7      Hematology Recent Labs Lab 07/20/16 2107 07/22/16 0415 07/23/16 0445  WBC 11.6* 13.0* 13.3*  RBC 5.83* 5.70* 5.68*  HGB 17.2* 16.8* 16.5*  HCT 54.7* 53.2* 53.9*  MCV 93.8 93.3 94.9  MCH 29.5 29.5 29.0  MCHC 31.4 31.6 30.6  RDW 15.2 15.0 14.9  PLT 239 235 199    Cardiac Enzymes Recent Labs Lab 07/21/16 0101 07/21/16 0410 07/21/16 1057  TROPONINI 0.06* 0.06* 0.06*    Recent Labs Lab 07/20/16 2117  TROPIPOC 0.06     BNP Recent Labs Lab 07/20/16 2107  BNP 1,113.9*     DDimer  Recent Labs Lab 07/21/16 0843  DDIMER 1.91*     Radiology    Ct Angio Chest Pe W Or Wo Contrast  Result  Date: 07/21/2016 CLINICAL DATA:  Hypoxia, elevated D-dimer level. EXAM: CT ANGIOGRAPHY CHEST WITH CONTRAST TECHNIQUE: Multidetector CT imaging of the chest was performed using the standard protocol during bolus administration of intravenous contrast. Multiplanar CT image reconstructions and MIPs were obtained to evaluate the vascular anatomy. CONTRAST:  100 mL of Isovue 370 intravenously. COMPARISON:  CT scan of February 04, 2010. FINDINGS: Cardiovascular: Satisfactory opacification of the pulmonary arteries to the segmental level. No evidence of pulmonary embolism. Normal heart size. No pericardial effusion. Atherosclerosis of thoracic aorta is noted without aneurysm formation. Enlargement of pulmonary arteries is noted suggesting pulmonary artery hypertension. Coronary artery calcifications are noted. Mediastinum/Nodes: No enlarged mediastinal, hilar, or axillary lymph nodes. Thyroid gland, trachea, and esophagus demonstrate no significant findings. Lungs/Pleura: Minimal bilateral pleural effusions are noted with adjacent subsegmental  atelectasis. No pneumothorax is noted. Right middle lobe atelectasis or infiltrate is noted. Upper Abdomen: No acute abnormality. Musculoskeletal: No chest wall abnormality. No acute or significant osseous findings. Multilevel degenerative disc disease is noted in the thoracic spine. Review of the MIP images confirms the above findings. IMPRESSION: No definite evidence of pulmonary embolus. Aortic atherosclerosis. Coronary artery calcifications are noted suggesting coronary artery disease. Enlargement of pulmonary arteries are noted suggesting pulmonary artery hypertension. Minimal bilateral pleural effusions are noted with adjacent subsegmental atelectasis. Right middle lobe atelectasis or pneumonia is noted as well. Electronically Signed   By: Marijo Conception, M.D.   On: 07/21/2016 12:35   Dg Chest Port 1 View  Result Date: 07/23/2016 CLINICAL DATA:  Respiratory failure EXAM: PORTABLE CHEST 1 VIEW COMPARISON:  July 22, 2016 FINDINGS: There remains interstitial edema with patchy alveolar consolidation in the bases. There are small bilateral pleural effusions. There is atelectatic change in the medial left base, stable. There is cardiomegaly with pulmonary venous hypertension. There is aortic atherosclerosis. No adenopathy evident. No bone lesions. IMPRESSION: Persistent changes of congestive heart failure, not appreciably changed. No new opacity evident. There is aortic atherosclerosis. Electronically Signed   By: Lowella Grip III M.D.   On: 07/23/2016 07:31   Dg Chest Port 1 View  Result Date: 07/22/2016 CLINICAL DATA:  71 year old female with respiratory failure, congestive heart failure. Two weeks of progressive shortness of breath and lower extremity swelling. Undergoing diuresis. EXAM: PORTABLE CHEST 1 VIEW COMPARISON:  Chest CTA 07/21/2016 and earlier FINDINGS: Portable AP semi upright view at at 0458 hours. Chest CTA yesterday demonstrated dependent opacity and small bilateral pleural effusions.  Pulmonary vascular congestion appears mildly increased compared to 07/20/2016. Bibasilar opacity is stable since yesterday. No pneumothorax. Stable cardiomegaly and mediastinal contours. Calcified aortic atherosclerosis. Visualized tracheal air column is within normal limits. IMPRESSION: 1. Pulmonary vascular congestion appears mildly worsened since 07/20/2016. 2. Stable patchy bibasilar opacity since yesterday favored to be atelectasis. Electronically Signed   By: Genevie Ann M.D.   On: 07/22/2016 07:33    Cardiac Studies   None, see echo results  Patient Profile     71 y.o. female admitted with respiratory failure, secondary to a combination of congestive heart failure as well as chronic interstitial lung disease, with associated cor pulmonale  Assessment & Plan    1. Severe right heart failure - we'll try to wean off the levophed. We'll plan to remove her art line. No evidence of pulmonary embolism. Her right heart failure appears to be secondary to cor pulmonale. 2. Respiratory failure, hypoxic and hypercarbic - her weight has gone down and her kidney function is stable. We'll continue IV diuresis.  She may well require home oxygen. 3. Coronary artery disease status post MI - she has no evidence of active ischemia. She will continue her current medications 4. Acute on chronic renal failure stage III - her creatinine has improved over the last 24 hours. Hopefully this will coincide with improvement in her volume status.  Signed, Cristopher Peru, MD  07/23/2016, 8:51 AM  Patient ID: Deanna Johnston, female   DOB: 1945/02/18, 71 y.o.   MRN: [id number]

## 2016-07-23 NOTE — Progress Notes (Signed)
[id number] PULMONARY / CRITICAL CARE MEDICINE   Name: Deanna Johnston MRN: [id number] DOB: 28-Feb-1945    ADMISSION DATE:  07/20/2016 CONSULTATION DATE:  07/21/2016  REFERRING MD:  Dr. Jori Moll   CHIEF COMPLAINT:  Dyspnea   HISTORY OF PRESENT ILLNESS:   71 year old female with PMH of COPD (Active smoker, 40-50 year 2 ppd, reports cut back to 1/2 ppd), CAD s/p stents in 2011, HLD, MI. Reports at baseline is SOB with chronic cough.   Presents to ED on 6/7 with progressive dyspnea for the last 2 weeks with increased swelling to BLE. Upon arrival to ED BNP 1113.9, WBC 11.6, Lactic 1.45, ABG 7.3/60/63. Hypotensive with systolic 56-21, oxygenation 90s on 4L [place], received 500 ml bolus and Rocephin/Azithromycin. Cardiology was consulted who recommended diuresis. PCCM asked to admit.    SUBJECTIVE:   Stable resp status on HFNC In chair today AM Still on levophed  VITAL SIGNS: BP 111/74 (BP Location: Left Arm)   Pulse (!) 103   Temp 98.7 F (37.1 C) (Oral)   Resp (!) 41   Ht 4\' 10"  (1.473 m)   Wt 173 lb 11.6 oz (78.8 kg)   SpO2 92%   BMI 36.31 kg/m   HEMODYNAMICS:    VENTILATOR SETTINGS:    INTAKE / OUTPUT: I/O last 3 completed shifts: In: 2700.8 [P.O.:720; I.V.:1880.8; IV Piggyback:100] Out: 6200 [Urine:6200]  PHYSICAL EXAMINATION:. Gen:      No acute distress HEENT:  EOMI, sclera anicteric Neck:     No masses; no thyromegaly Lungs:    Clear to auscultation bilaterally; normal respiratory effort CV:         Regular rate and rhythm; no murmurs Abd:      + bowel sounds; soft, non-tender; no palpable masses, no distension Ext:    1-2+  edema; adequate peripheral perfusion Skin:      Warm and dry; no rash Neuro: alert and oriented x 3  LABS:  BMET  Recent Labs Lab 07/21/16 2329 07/22/16 0415 07/23/16 0445  NA 137 138 139  K 4.7 4.5 4.3  CL 94* 97* 93*  CO2 34* 33* 39*  BUN 53* 47* 24*  CREATININE 1.52* 1.21* 0.99  GLUCOSE 126* 137* 130*    Electrolytes  Recent  Labs Lab 07/21/16 0101  07/21/16 2329 07/22/16 0415 07/23/16 0445  CALCIUM 8.3*  < > 8.4* 8.2* 8.1*  MG 2.5*  --   --  2.1 1.7  PHOS 5.0*  --   --  4.3 3.0  < > = values in this interval not displayed.  CBC  Recent Labs Lab 07/20/16 2107 07/22/16 0415 07/23/16 0445  WBC 11.6* 13.0* 13.3*  HGB 17.2* 16.8* 16.5*  HCT 54.7* 53.2* 53.9*  PLT 239 235 199    Coag's No results for input(s): APTT, INR in the last 168 hours.  Sepsis Markers  Recent Labs Lab 07/20/16 2244 07/21/16 0101 07/21/16 0410 07/22/16 0415  LATICACIDVEN 1.45  --   --   --   PROCALCITON  --  <0.10 <0.10 <0.10    ABG  Recent Labs Lab 07/21/16 0304 07/21/16 0419 07/21/16 1040  PHART 7.199* 7.254* 7.292*  PCO2ART 77.7* 72.0* 67.1*  PO2ART 58.0* 62.0* 65.0*    Liver Enzymes No results for input(s): AST, ALT, ALKPHOS, BILITOT, ALBUMIN in the last 168 hours.  Cardiac Enzymes  Recent Labs Lab 07/21/16 0101 07/21/16 0410 07/21/16 1057  TROPONINI 0.06* 0.06* 0.06*    Glucose  Recent Labs Lab 07/21/16 2228 07/22/16 0724  07/22/16 1134 07/22/16 1634 07/22/16 2114 07/23/16 0747  GLUCAP 127* 153* 205* 175* 166* 174*    Imaging Dg Chest Port 1 View  Result Date: 07/23/2016 CLINICAL DATA:  Respiratory failure EXAM: PORTABLE CHEST 1 VIEW COMPARISON:  July 22, 2016 FINDINGS: There remains interstitial edema with patchy alveolar consolidation in the bases. There are small bilateral pleural effusions. There is atelectatic change in the medial left base, stable. There is cardiomegaly with pulmonary venous hypertension. There is aortic atherosclerosis. No adenopathy evident. No bone lesions. IMPRESSION: Persistent changes of congestive heart failure, not appreciably changed. No new opacity evident. There is aortic atherosclerosis. Electronically Signed   By: Lowella Grip III M.D.   On: 07/23/2016 07:31     STUDIES:  CXR 6/7 > Mild cardiomegaly in vascular congestion. Heterogeneous  bibasilar opacities may be atelectasis, PNA, or pulmonary edema  ECHO 6/8 > EF 65-70%, G1DD, mod to severely reduced RV systolic function. CTA chest 6/8 > No PE, RML atelectasis, Minimal effusions. Enlarged PA LE dupex 3/8 > No DVT  CULTURES: Blood 6/7 > Sputum 6/8 >   ANTIBIOTICS: Azithromycin 6/7 > 6/8 Rocephin 6/7 >    SIGNIFICANT EVENTS: 6/7 > Presents to ED SOB  LINES/TUBES: None.  DISCUSSION: 71 year old female presents to ED with reported 2 weeks of progressive dyspnea and BLE swelling. BNP 1113.9. Cardiology following.   ASSESSMENT / PLAN:  Acute on Chronic Hypoxic/Hypercarbic Respiratory Failure. Possible AECOPD. PE, DVT ruled out ? OSA. Tobacco dependence. P:   Wean down O2 as tolerated Bipap PRN Continue ceftriaxone Continue duonebs Sleep study as outpatient.  Shock - likely multifactorial from RV failure (Echo 07/21/16 with EF 65-70%, G1DD, mod to severely reduced RV systolic function).  Another possibility includes chronic RV failure due to underlying lung disease with cor pulmonale. H/O CAD s/p stenting, HLD. P:  Cardiology Following.  Continue lasix diuresis Wean levophed. PICC line today for access Hold home lopressor, valsartan Continue aspirin  AKI. Volume Overload - improving gradually. P:   Continue lasix Replete lytes  FAMILY  - Updates: Family updated at bedside on admission 6/8. Patient states that she would like [name] (Niece) to be HCPOA [phone] in the event she can not make her own decisions  No family at bedside 6/9  - Inter-disciplinary family meet or Palliative Care meeting due by:  07/28/2016  The patient is critically ill with multiple organ system failure and requires high complexity decision making for assessment and support, frequent evaluation and titration of therapies, advanced monitoring, review of radiographic studies and interpretation of complex data.   Critical Care Time devoted to patient care services,  exclusive of separately billable procedures, described in this note is 40 minutes.   Marshell Garfinkel MD  Pulmonary and Critical Care Pager [phone] If no answer or after 3pm call: [phone] 07/23/2016, 8:54 AM

## 2016-07-24 ENCOUNTER — Inpatient Hospital Stay (HOSPITAL_COMMUNITY): Payer: Medicare Other

## 2016-07-24 DIAGNOSIS — I25119 Atherosclerotic heart disease of native coronary artery with unspecified angina pectoris: Secondary | ICD-10-CM

## 2016-07-24 DIAGNOSIS — I5033 Acute on chronic diastolic (congestive) heart failure: Secondary | ICD-10-CM

## 2016-07-24 DIAGNOSIS — I2609 Other pulmonary embolism with acute cor pulmonale: Secondary | ICD-10-CM

## 2016-07-24 LAB — GLUCOSE, CAPILLARY
GLUCOSE-CAPILLARY: 151 mg/dL — AB (ref 65–99)
Glucose-Capillary: 162 mg/dL — ABNORMAL HIGH (ref 65–99)
Glucose-Capillary: 114 mg/dL — ABNORMAL HIGH (ref 65–99)
Glucose-Capillary: 197 mg/dL — ABNORMAL HIGH (ref 65–99)

## 2016-07-24 LAB — CBC
HCT: 52.2 % — ABNORMAL HIGH (ref 36.0–46.0)
HEMOGLOBIN: 16.1 g/dL — AB (ref 12.0–15.0)
MCH: 29.1 pg (ref 26.0–34.0)
MCHC: 30.8 g/dL (ref 30.0–36.0)
MCV: 94.4 fL (ref 78.0–100.0)
Platelets: 162 10*3/uL (ref 150–400)
RBC: 5.53 MIL/uL — AB (ref 3.87–5.11)
RDW: 14.9 % (ref 11.5–15.5)
WBC: 11.7 10*3/uL — ABNORMAL HIGH (ref 4.0–10.5)

## 2016-07-24 LAB — BASIC METABOLIC PANEL
Anion gap: 8 (ref 5–15)
BUN: 19 mg/dL (ref 6–20)
CHLORIDE: 87 mmol/L — AB (ref 101–111)
CO2: 42 mmol/L — ABNORMAL HIGH (ref 22–32)
Calcium: 8.2 mg/dL — ABNORMAL LOW (ref 8.9–10.3)
Creatinine, Ser: 0.89 mg/dL (ref 0.44–1.00)
GFR calc Af Amer: >60 mL/min (ref >60)
GFR calc non Af Amer: >60 mL/min (ref >60)
Glucose, Bld: 177 mg/dL — ABNORMAL HIGH (ref 65–99)
POTASSIUM: 4 mmol/L (ref 3.5–5.1)
SODIUM: 137 mmol/L (ref 135–145)

## 2016-07-24 LAB — PHOSPHORUS: PHOSPHORUS: 2.9 mg/dL (ref 2.5–4.6)

## 2016-07-24 LAB — MAGNESIUM: MAGNESIUM: 1.8 mg/dL (ref 1.7–2.4)

## 2016-07-24 MED ORDER — DOPAMINE-DEXTROSE 3.2-5 MG/ML-% IV SOLN
INTRAVENOUS | Status: AC
  Filled 2016-07-24: qty 250

## 2016-07-24 MED ORDER — DOPAMINE-DEXTROSE 3.2-5 MG/ML-% IV SOLN
0.0000 ug/kg/min | INTRAVENOUS | Status: DC
Start: 2016-07-24 — End: 2016-07-28
  Administered 2016-07-24: 3 ug/kg/min via INTRAVENOUS
  Administered 2016-07-25: 9 ug/kg/min via INTRAVENOUS
  Administered 2016-07-26: 6 ug/kg/min via INTRAVENOUS
  Filled 2016-07-24 (×3): qty 250

## 2016-07-24 NOTE — Progress Notes (Signed)
Progress Note  Patient Name: Deanna Johnston Date of Encounter: 07/24/2016  Primary Cardiologist: [place]   Subjective   71 yo with hx of COPE ( still smoking )  CAD, hyperlipidemia  Has Right heart failure/ cor pulmonale secondary to COPD  Admitted with progressive leg swelling , hypotension   Has diuresed 7.5 liters so far this admission  Echo from 6/8 shows normal/ hyperdynamic  LV systolic funciton , grade 1 diastolic dysfunction  Mod. Dilation of her RV with moderate - severe RV dysfunction  Unable to estimate pulmonary pressure  Inpatient Medications    Scheduled Meds: . aspirin EC  81 mg Oral Daily  . Chlorhexidine Gluconate Cloth  6 each Topical Daily  . furosemide  40 mg Intravenous Q12H  . insulin aspart  0-5 Units Subcutaneous QHS  . insulin aspart  0-9 Units Subcutaneous TID WC  . ipratropium-albuterol  3 mL Nebulization TID  . mouth rinse  15 mL Mouth Rinse BID  . nicotine  14 mg Transdermal Daily  . sodium chloride flush  10-40 mL Intracatheter Q12H   Continuous Infusions: . sodium chloride Stopped (07/21/16 1900)  . sodium chloride    . cefTRIAXone (ROCEPHIN)  IV Stopped (07/23/16 2230)  . norepinephrine (LEVOPHED) Adult infusion 7.013 mcg/min (07/24/16 0500)   PRN Meds: sodium chloride, Place/Maintain arterial line **AND** sodium chloride, sodium chloride flush   Vital Signs    Vitals:   07/24/16 0340 07/24/16 0400 07/24/16 0500 07/24/16 0722  BP: (!) 125/58 (!) 113/50 (!) 98/43   Pulse: 91 93 91   Resp: (!) 24 (!) 24 (!) 29   Temp: 99.5 F (37.5 C)   98.2 F (36.8 C)  TempSrc: Oral   Oral  SpO2: 92% 93% 92% (!) 88%  Weight:   167 lb 1.6 oz (75.8 kg)   Height:        Intake/Output Summary (Last 24 hours) at 07/24/16 0810 Last data filed at 07/24/16 0500  Gross per 24 hour  Intake          1778.64 ml  Output             3400 ml  Net         -1621.36 ml   Filed Weights   07/22/16 0630 07/23/16 0545 07/24/16 0500  Weight: 176 lb 9.4 oz  (80.1 kg) 173 lb 11.6 oz (78.8 kg) 167 lb 1.6 oz (75.8 kg)    Telemetry      Sinus tach - Personally Reviewed  ECG    NSR  - Personally Reviewed  Physical Exam   GEN: elderly female, appears chronically ill  Neck: No JVD Cardiac: RR, soft systolic murmur   Respiratory:  bilateral rales, rhonchi  GI: Soft, nontender, non-distended  MS: 1-2 + pitting edema  Neuro:  Nonfocal  Psych: Normal affect   Labs    Chemistry Recent Labs Lab 07/22/16 0415 07/23/16 0445 07/24/16 0426  NA 138 139 137  K 4.5 4.3 4.0  CL 97* 93* 87*  CO2 33* 39* 42*  GLUCOSE 137* 130* 177*  BUN 47* 24* 19  CREATININE 1.21* 0.99 0.89  CALCIUM 8.2* 8.1* 8.2*  GFRNONAA 43* 55* >60  GFRAA 50* >60 >60  ANIONGAP 8 7 8      Hematology Recent Labs Lab 07/22/16 0415 07/23/16 0445 07/24/16 0426  WBC 13.0* 13.3* 11.7*  RBC 5.70* 5.68* 5.53*  HGB 16.8* 16.5* 16.1*  HCT 53.2* 53.9* 52.2*  MCV 93.3 94.9 94.4  MCH 29.5 29.0  29.1  MCHC 31.6 30.6 30.8  RDW 15.0 14.9 14.9  PLT 235 199 162    Cardiac Enzymes Recent Labs Lab 07/21/16 0101 07/21/16 0410 07/21/16 1057  TROPONINI 0.06* 0.06* 0.06*    Recent Labs Lab 07/20/16 2117  TROPIPOC 0.06     BNP Recent Labs Lab 07/20/16 2107  BNP 1,113.9*     DDimer  Recent Labs Lab 07/21/16 0843  DDIMER 1.91*     Radiology    Dg Chest Port 1 View  Result Date: 07/24/2016 CLINICAL DATA:  71 year old female with shortness of breath. Subsequent encounter. EXAM: PORTABLE CHEST 1 VIEW COMPARISON:  07/23/2016 chest x-ray and 07/21/2016 chest CT. FINDINGS: Right PICC line tip distal superior vena cava level. Cardiomegaly. Pulmonary edema similar to prior exam. Left base subsegmental atelectasis. Calcified aorta. Prominence of pulmonary artery suggestive of pulmonary arterial hypertension. IMPRESSION: Right PICC line has been place with the tip at the level of the distal superior vena cava. Cardiomegaly. Pulmonary edema and left base subsegmental  atelectasis similar to prior exam. Aortic atherosclerosis. Electronically Signed   By: Genia Del M.D.   On: 07/24/2016 07:05   Dg Chest Port 1 View  Result Date: 07/23/2016 CLINICAL DATA:  Respiratory failure EXAM: PORTABLE CHEST 1 VIEW COMPARISON:  July 22, 2016 FINDINGS: There remains interstitial edema with patchy alveolar consolidation in the bases. There are small bilateral pleural effusions. There is atelectatic change in the medial left base, stable. There is cardiomegaly with pulmonary venous hypertension. There is aortic atherosclerosis. No adenopathy evident. No bone lesions. IMPRESSION: Persistent changes of congestive heart failure, not appreciably changed. No new opacity evident. There is aortic atherosclerosis. Electronically Signed   By: Lowella Grip III M.D.   On: 07/23/2016 07:31    Cardiac Studies     Patient Profile     71 y.o. female with COPD and subsequent cor pulmonale   Assessment & Plan    1. Cor pulmonale:   Continue pressor support - discussed with Dr. Nelda Marseille.   Will change to dopamine which should help the R heart failure better.  Continue supportive care.   2. CAD :  No angina      Signed, Mertie Moores, MD  07/24/2016, 8:10 AM

## 2016-07-24 NOTE — Progress Notes (Addendum)
[id number] PULMONARY / CRITICAL CARE MEDICINE   Name: Deanna Johnston MRN: [id number] DOB: [birth year]    ADMISSION DATE:  07/20/2016 CONSULTATION DATE:  07/21/2016  REFERRING MD:  Dr. Jori Moll   CHIEF COMPLAINT:  Dyspnea   HISTORY OF PRESENT ILLNESS:   71 year old female with PMH of COPD (Active smoker, 40-50 year 2 ppd, reports cut back to 1/2 ppd), CAD s/p stents in 2011, HLD, MI. Reports at baseline is SOB with chronic cough.   Presents to ED on 6/7 with progressive dyspnea for the last 2 weeks with increased swelling to BLE. Upon arrival to ED BNP 1113.9, WBC 11.6, Lactic 1.45, ABG 7.3/60/63. Hypotensive with systolic 88-50, oxygenation 90s on 4L [place], received 500 ml bolus and Rocephin/Azithromycin. Cardiology was consulted who recommended diuresis. PCCM asked to admit.    SUBJECTIVE:   Did not require BiPAP overnight but remains on HFNC at 8L and lovphed at 7 mcg  VITAL SIGNS: BP (!) 98/43   Pulse 91   Temp 98.2 F (36.8 C) (Oral)   Resp (!) 29   Ht 4\' 10"  (1.473 m)   Wt 75.8 kg (167 lb 1.6 oz)   SpO2 (!) 88%   BMI 34.92 kg/m   HEMODYNAMICS:    VENTILATOR SETTINGS:    INTAKE / OUTPUT: I/O last 3 completed shifts: In: 2659.8 [P.O.:1320; I.V.:1289.8; IV Piggyback:50] Out: 2774 [Urine:5750]  PHYSICAL EXAMINATION:. Gen:      Awake, sitting in chair, NAD HEENT:  [place]/AT, PERRL, EOM-I and MMM Neck:     No mass, mild JVD noted Lungs:   Bibasilar crackles noted CV:        RRR, Nl S1/S2, -M/R/G. Abd:      Soft, NT, ND and +BS Ext:    -edema and -tenderness  Skin:      Warm and dry; no rash Neuro:   Alert and oriented x 3  LABS:  BMET  Recent Labs Lab 07/22/16 0415 07/23/16 0445 07/24/16 0426  NA 138 139 137  K 4.5 4.3 4.0  CL 97* 93* 87*  CO2 33* 39* 42*  BUN 47* 24* 19  CREATININE 1.21* 0.99 0.89  GLUCOSE 137* 130* 177*   Electrolytes  Recent Labs Lab 07/22/16 0415 07/23/16 0445 07/24/16 0426  CALCIUM 8.2* 8.1* 8.2*  MG 2.1 1.7 1.8  PHOS 4.3 3.0 2.9    CBC  Recent Labs Lab 07/22/16 0415 07/23/16 0445 07/24/16 0426  WBC 13.0* 13.3* 11.7*  HGB 16.8* 16.5* 16.1*  HCT 53.2* 53.9* 52.2*  PLT 235 199 162   Coag's No results for input(s): APTT, INR in the last 168 hours.  Sepsis Markers  Recent Labs Lab 07/20/16 2244 07/21/16 0101 07/21/16 0410 07/22/16 0415  LATICACIDVEN 1.45  --   --   --   PROCALCITON  --  <0.10 <0.10 <0.10   ABG  Recent Labs Lab 07/21/16 0304 07/21/16 0419 07/21/16 1040  PHART 7.199* 7.254* 7.292*  PCO2ART 77.7* 72.0* 67.1*  PO2ART 58.0* 62.0* 65.0*   Liver Enzymes No results for input(s): AST, ALT, ALKPHOS, BILITOT, ALBUMIN in the last 168 hours.  Cardiac Enzymes  Recent Labs Lab 07/21/16 0101 07/21/16 0410 07/21/16 1057  TROPONINI 0.06* 0.06* 0.06*   Glucose  Recent Labs Lab 07/22/16 2114 07/23/16 0747 07/23/16 1117 07/23/16 1629 07/23/16 2157 07/24/16 0753  GLUCAP 166* 174* 157* 140* 130* 162*   Imaging Dg Chest Port 1 View  Result Date: 07/24/2016 CLINICAL DATA:  71 year old female with shortness of breath. Subsequent encounter. EXAM:  PORTABLE CHEST 1 VIEW COMPARISON:  07/23/2016 chest x-ray and 07/21/2016 chest CT. FINDINGS: Right PICC line tip distal superior vena cava level. Cardiomegaly. Pulmonary edema similar to prior exam. Left base subsegmental atelectasis. Calcified aorta. Prominence of pulmonary artery suggestive of pulmonary arterial hypertension. IMPRESSION: Right PICC line has been place with the tip at the level of the distal superior vena cava. Cardiomegaly. Pulmonary edema and left base subsegmental atelectasis similar to prior exam. Aortic atherosclerosis. Electronically Signed   By: Genia Del M.D.   On: 07/24/2016 07:05   STUDIES:  CXR 6/7 > Mild cardiomegaly in vascular congestion. Heterogeneous bibasilar opacities may be atelectasis, PNA, or pulmonary edema  ECHO 6/8 > EF 65-70%, G1DD, mod to severely reduced RV systolic function. CTA chest 6/8 > No  PE, RML atelectasis, Minimal effusions. Enlarged PA LE dupex 3/8 > No DVT  CULTURES: Blood 6/7 > Sputum 6/8 >   ANTIBIOTICS: Azithromycin 6/7 > 6/8 Rocephin 6/7 >    SIGNIFICANT EVENTS: 6/7 > Presents to ED SOB  LINES/TUBES: R PICC line 6/10>>>  DISCUSSION: 71 year old female presents to ED with reported 2 weeks of progressive dyspnea and BLE swelling. BNP 1113.9. Cardiology following.   ASSESSMENT / PLAN:  Acute on Chronic Hypoxic/Hypercarbic Respiratory Failure. Possible AECOPD. PE, DVT ruled out ? OSA. Tobacco dependence. P:   Wean down O2 as tolerated Change BiPAP to PRN Continue ceftriaxone Continue duonebs Sleep study as outpatient. Likely hypoxemic for years and now we are seeing effect, will need O2 at home upon discharge  Shock - likely multifactorial from RV failure (Echo 07/21/16 with EF 65-70%, G1DD, mod to severely reduced RV systolic function).  Another possibility includes chronic RV failure due to underlying lung disease with cor pulmonale. H/O CAD s/p stenting, HLD. P:  Cardiology Following, appreciate input Continue lasix diuresis 40 mg IV q12 hours Change levophed to dopamine (had better response per RN staff) Hold home lopressor, valsartan Continue aspirin  AKI. Volume Overload - improving gradually. P:   Continue lasix Replete lytes BMET in AM  FAMILY  - Updates: Patient and niece (DPOA) updated bedside 6/11, after discussion, decision was made to make LCB with no intubation, CPR or cardioversion.  Pressors only.  - Inter-disciplinary family meet or Palliative Care meeting due by:  07/28/2016  The patient is critically ill with multiple organ systems failure and requires high complexity decision making for assessment and support, frequent evaluation and titration of therapies, application of advanced monitoring technologies and extensive interpretation of multiple databases.   Critical Care Time devoted to patient care services described in  this note is  35  Minutes. This time reflects time of care of this signee Dr Jennet Maduro. This critical care time does not reflect procedure time, or teaching time or supervisory time of PA/NP/Med student/Med Resident etc but could involve care discussion time.  Rush Farmer, M.D. [facility] Pulmonary/Critical Care Medicine. Pager: [phone]. After hours pager: [phone].

## 2016-07-25 LAB — GLUCOSE, CAPILLARY
GLUCOSE-CAPILLARY: 158 mg/dL — AB (ref 65–99)
Glucose-Capillary: 158 mg/dL — ABNORMAL HIGH (ref 65–99)
Glucose-Capillary: 159 mg/dL — ABNORMAL HIGH (ref 65–99)
Glucose-Capillary: 117 mg/dL — ABNORMAL HIGH (ref 65–99)

## 2016-07-25 MED ORDER — SODIUM CHLORIDE 0.9 % IV SOLN: INTRAVENOUS | Status: DC

## 2016-07-25 MED ORDER — SODIUM CHLORIDE 0.9% FLUSH: 3.0000 mL | INTRAVENOUS | Status: DC | PRN

## 2016-07-25 MED ORDER — SODIUM CHLORIDE 0.9% FLUSH
3.0000 mL | Freq: Two times a day (BID) | INTRAVENOUS | Status: DC
  Administered 2016-07-25: 10 mL via INTRAVENOUS

## 2016-07-25 MED ORDER — ASPIRIN 81 MG PO CHEW
81.0000 mg | CHEWABLE_TABLET | ORAL | Status: AC
  Administered 2016-07-26: 81 mg via ORAL
  Filled 2016-07-25: qty 1

## 2016-07-25 MED ORDER — NICOTINE 7 MG/24HR TD PT24
7.0000 mg | MEDICATED_PATCH | Freq: Every day | TRANSDERMAL | Status: DC
  Administered 2016-07-25 – 2016-07-29 (×5): 7 mg via TRANSDERMAL
  Filled 2016-07-25 (×5): qty 1

## 2016-07-25 MED ORDER — SODIUM CHLORIDE 0.9 % IV SOLN: 250.0000 mL | INTRAVENOUS | Status: DC | PRN

## 2016-07-25 MED ORDER — POLYETHYLENE GLYCOL 3350 17 G PO PACK
17.0000 g | PACK | Freq: Every day | ORAL | Status: DC | PRN
  Administered 2016-07-25: 17 g via ORAL
  Filled 2016-07-25: qty 1

## 2016-07-25 MED ORDER — PRAVASTATIN SODIUM 20 MG PO TABS
10.0000 mg | ORAL_TABLET | Freq: Every day | ORAL | Status: DC
  Administered 2016-07-25 – 2016-07-28 (×4): 10 mg via ORAL
  Filled 2016-07-25 (×4): qty 1

## 2016-07-25 NOTE — Progress Notes (Signed)
RN offered for pt to watch video about heart catheterization.  Pt refused.

## 2016-07-25 NOTE — Progress Notes (Signed)
[id number] PULMONARY / CRITICAL CARE MEDICINE   Name: Deanna Johnston MRN: [id number] DOB: 04-20-1945    ADMISSION DATE:  07/20/2016 CONSULTATION DATE:  07/21/2016  REFERRING MD:  Dr. Jori Moll   CHIEF COMPLAINT:  Dyspnea   HISTORY OF PRESENT ILLNESS:   71 year old female with PMH of COPD (Active smoker, 40-50 year 2 ppd, reports cut back to 1/2 ppd), CAD s/p stents in 2011, HLD, MI. Reports at baseline is SOB with chronic cough.   Presents to ED on 6/7 with progressive dyspnea for the last 2 weeks with increased swelling to BLE. Upon arrival to ED BNP 1113.9, WBC 11.6, Lactic 1.45, ABG 7.3/60/63. Hypotensive with systolic 79-02, oxygenation 90s on 4L [place], received 500 ml bolus and Rocephin/Azithromycin. Cardiology was consulted who recommended diuresis. PCCM asked to admit.    SUBJECTIVE:   Off bipap. O2 down to 4 l/m [place]  VITAL SIGNS: BP (!) 124/59 (BP Location: Left Arm)   Pulse (!) 110   Temp 98.4 F (36.9 C) (Oral)   Resp (!) 29   Ht 4\' 10"  (1.473 m)   Wt 164 lb 3.9 oz (74.5 kg)   SpO2 94%   BMI 34.33 kg/m   HEMODYNAMICS: CVP:  [1 mmHg-11 mmHg] 7 mmHg  VENTILATOR SETTINGS:    INTAKE / OUTPUT: I/O last 3 completed shifts: In: 994.5 [P.O.:120; I.V.:774.5; IV Piggyback:100] Out: 4225 [Urine:4225]  PHYSICAL EXAMINATION:. General:  WNWD NAD Sitting in chair eating HEENT: MM pink/moist, no jvd/lan PSY:Nl affect Neuro: Intact CV: HSR RRR PULM: Decreased air movement IO:XBDZ, non-tender, bsx4 active  Extremities: warm/dry, ++edema  Skin: Lower ext rash(dc abx 6/12) LABS:  BMET  Recent Labs Lab 07/22/16 0415 07/23/16 0445 07/24/16 0426  NA 138 139 137  K 4.5 4.3 4.0  CL 97* 93* 87*  CO2 33* 39* 42*  BUN 47* 24* 19  CREATININE 1.21* 0.99 0.89  GLUCOSE 137* 130* 177*   Electrolytes  Recent Labs Lab 07/22/16 0415 07/23/16 0445 07/24/16 0426  CALCIUM 8.2* 8.1* 8.2*  MG 2.1 1.7 1.8  PHOS 4.3 3.0 2.9   CBC  Recent Labs Lab 07/22/16 0415 07/23/16 0445  07/24/16 0426  WBC 13.0* 13.3* 11.7*  HGB 16.8* 16.5* 16.1*  HCT 53.2* 53.9* 52.2*  PLT 235 199 162   Coag's No results for input(s): APTT, INR in the last 168 hours.  Sepsis Markers  Recent Labs Lab 07/20/16 2244 07/21/16 0101 07/21/16 0410 07/22/16 0415  LATICACIDVEN 1.45  --   --   --   PROCALCITON  --  <0.10 <0.10 <0.10   ABG  Recent Labs Lab 07/21/16 0304 07/21/16 0419 07/21/16 1040  PHART 7.199* 7.254* 7.292*  PCO2ART 77.7* 72.0* 67.1*  PO2ART 58.0* 62.0* 65.0*   Liver Enzymes No results for input(s): AST, ALT, ALKPHOS, BILITOT, ALBUMIN in the last 168 hours.  Cardiac Enzymes  Recent Labs Lab 07/21/16 0101 07/21/16 0410 07/21/16 1057  TROPONINI 0.06* 0.06* 0.06*   Glucose  Recent Labs Lab 07/23/16 2157 07/24/16 0753 07/24/16 1135 07/24/16 1626 07/24/16 2233 07/25/16 0755  GLUCAP 130* 162* 151* 114* 197* 158*   Imaging No results found. STUDIES:  CXR 6/7 > Mild cardiomegaly in vascular congestion. Heterogeneous bibasilar opacities may be atelectasis, PNA, or pulmonary edema  ECHO 6/8 > EF 65-70%, G1DD, mod to severely reduced RV systolic function. CTA chest 6/8 > No PE, RML atelectasis, Minimal effusions. Enlarged PA LE dupex 3/8 > No DVT  CULTURES: Blood 6/7 > Sputum 6/8 >   ANTIBIOTICS:  Azithromycin 6/7 > 6/8 Rocephin 6/7 >  6/12  SIGNIFICANT EVENTS: 6/7 > Presents to ED SOB  LINES/TUBES: R PICC line 6/10>>>  DISCUSSION: 71 year old female presents to ED with reported 2 weeks of progressive dyspnea and BLE swelling. BNP 1113.9. Cardiology following.   ASSESSMENT / PLAN:  Acute on Chronic Hypoxic/Hypercarbic Respiratory Failure. Possible AECOPD. PE, DVT ruled out ? OSA. Tobacco dependence. P:   Wean down O2 as tolerated and establish home O2 needs Change BiPAP to PRN Dc ceftriaxone 6/12  Continue duonebs Sleep study as outpatient. Likely hypoxemic for years and now we are seeing effect, will need O2 at home upon  discharge, suspect 2-4 l/m range  Shock - likely multifactorial from RV failure (Echo 07/21/16 with EF 65-70%, G1DD, mod to severely reduced RV systolic function).  Another possibility includes chronic RV failure due to underlying lung disease with cor pulmonale. H/O CAD s/p stenting, HLD. P:  Cardiology Following, appreciate input Continue lasix diuresis 40 mg IV q12 hours  dopamine wean as tolerated per cards Hold home lopressor, valsartan Continue aspirin  AKI. Volume Overload - improving gradually. Lab Results  Component Value Date   CREATININE 0.89 07/24/2016   CREATININE 0.99 07/23/2016   CREATININE 1.21 (H) 07/22/2016   CREATININE 0.83 04/22/2010    Recent Labs Lab 07/22/16 0415 07/23/16 0445 07/24/16 0426  K 4.5 4.3 4.0      Intake/Output Summary (Last 24 hours) at 07/25/16 0905 Last data filed at 07/25/16 0800  Gross per 24 hour  Intake           623.52 ml  Output             2525 ml  Net         -1901.48 ml   P:   Continue lasix Replete lytes BMET in AM  FAMILY  - Updates: Patient and niece (DPOA) updated bedside 6/11, after discussion, decision was made to make LCB with no intubation, CPR or cardioversion.  Pressors only. 6/12 pt and niece updated. Further discussion concerning tobcco cessation.   - Inter-disciplinary family meet or Palliative Care meeting due by:  07/28/2016  CCT 30 min   Richardson Landry Minor ACNP Maryanna Shape PCCM Pager [phone] till 3 pm If no answer page [phone] 07/25/2016, 8:59 AM  Attending Note:  71 year old female with right heart failure and pulmonary edema in hypoxemic respiratory failure and cardiogenic shock.  On exam, bibasilar crackles noted.  I reviewed CXR myself with pulmonary edema noted.  Niece updated at length bedside.  Continue diureses.  O2 as needed but titrate for sat of 88-92%.  Arrange for home O2.  Titrate dopamine while we continue diureses.  Hold in ICU give cardiogenic shock and need for HFNC.  The patient is  critically ill with multiple organ systems failure and requires high complexity decision making for assessment and support, frequent evaluation and titration of therapies, application of advanced monitoring technologies and extensive interpretation of multiple databases.   Critical Care Time devoted to patient care services described in this note is  35  Minutes. This time reflects time of care of this signee Dr Jennet Maduro. This critical care time does not reflect procedure time, or teaching time or supervisory time of PA/NP/Med student/Med Resident etc but could involve care discussion time.  Rush Farmer, M.D. [facility] Pulmonary/Critical Care Medicine. Pager: [phone]. After hours pager: [phone].

## 2016-07-25 NOTE — Consult Note (Signed)
Advanced Heart Failure Team Consult Note   Primary Physician: Primary Cardiologist:  Dr Martinique  Reason for Consultation: Heart Failure   HPI:    Deanna Johnston is seen today for evaluation of Heart Failure  at the request of Dr Acie Fredrickson.   Deanna Johnston is a 71 year old with a history of COPD, CAD most recent cath below,  HLD, and CAD admitted with increased dyspnea and leg edema.  Prior to admit she was not not on diuretics. Over the last few weeks she has developed increased dyspnea and leg edema. Prior to admit she did not wear oxygen or CPAP. Says she has been falling asleep during the day. Having difficulty walking due to increased dyspnea and leg edema. Lives alone.    In the ED she was hypotensive and hypoxic requiring Bipap. Received IV fluids and rocephin/azithromycin due to concern for shock. CXR on admit concerning for pulmonary edema.  Pertinent admission labs included: creatinine 1.79, BNP 1113, troponin 0.06. Cardiology consulted and she was started on IV lasix + dopamine. Overall diuresed 15 pounds. Dopamine later stopped and she was placed on norepi. Yesterday he was switched back to dopamine for RV support. ECHO showed preserved LVEF but moderate/severely reduced RV. CTA negative for PE. Negative for DVT.   Overall feeling ok.  Denies SOB.   ECHO 07/21/2016 EF 65-70% Grade I DD RV moderately dilated/mod severely reduced RV.    LHC 2011s/p stenting of the mid RCA in Dec 2011 with a DES. Residual 70% stenosis in the mid LCx and 90% in the distal LCx.   Review of Systems: [y] = yes, [ ]  = no   General: Weight gain [ ] ; Weight loss [ ] ; Anorexia [ ] ; Fatigue [ Y]; Fever [ ] ; Chills [ ] ; Weakness [ ]   Cardiac: Chest pain/pressure [ ] ; Resting SOB [ ] ; Exertional SOB [ Y]; Orthopnea [ ] ; Pedal Edema [Y]; Palpitations [ ] ; Syncope [ ] ; Presyncope [ ] ; Paroxysmal nocturnal dyspnea[ ]   Pulmonary: Cough [ ] ; Wheezing[ ] ; Hemoptysis[ ] ; Sputum [ ] ; Snoring [ ]   GI: Vomiting[ ] ;  Dysphagia[ ] ; Melena[ ] ; Hematochezia [ ] ; Heartburn[ ] ; Abdominal pain [ ] ; Constipation [ ] ; Diarrhea [ ] ; BRBPR [ ]   GU: Hematuria[ ] ; Dysuria [ ] ; Nocturia[ ]   Vascular: Pain in legs with walking [ ] ; Pain in feet with lying flat [ ] ; Non-healing sores [ ] ; Stroke [ ] ; TIA [ ] ; Slurred speech [ ] ;  Neuro: Headaches[ ] ; Vertigo[ ] ; Seizures[ ] ; Paresthesias[ ] ;Blurred vision [ ] ; Diplopia [ ] ; Vision changes [ ]   Ortho/Skin: Arthritis [ ] ; Joint pain [Y ]; Muscle pain [ ] ; Joint swelling [ ] ; Back Pain [ ] ; Rash [ ]   Psych: Depression[ ] ; Anxiety[ ]   Heme: Bleeding problems [ ] ; Clotting disorders [ ] ; Anemia [ ]   Endocrine: Diabetes [ ] ; Thyroid dysfunction[ ]   Home Medications Prior to Admission medications   Medication Sig Start Date End Date Taking? Authorizing Provider  aspirin EC 81 MG tablet Take 81 mg by mouth daily.   Yes [provider]  metoprolol tartrate (LOPRESSOR) 25 MG tablet TAKE ONE TABLET BY MOUTH TWICE DAILY 04/12/16  Yes Martinique, Peter M, MD  nitroGLYCERIN (NITROSTAT) 0.4 MG SL tablet PLACE 1 TABLET UNDER THE TONGUE EVERY 5 MINUTES AS NEEDED FOR CHEST PAIN 04/12/16  Yes Martinique, Peter M, MD  valsartan (DIOVAN) 80 MG tablet TAKE ONE TABLET BY MOUTH ONCE DAILY 06/19/16  Yes Martinique, Peter  M, MD    Past Medical History: Past Medical History:  Diagnosis Date  . Cataract   . Cervical dysplasia   . COPD (chronic obstructive pulmonary disease) ([place])   . Coronary artery disease   . Hyperlipidemia   . MI (myocardial infarction) [facility]) Dec. 2011   stent mid right coronary  . Obesity   . Tobacco dependence   . VAIN (vaginal intraepithelial neoplasia) 2011   Efudex treatment    Past Surgical History: Past Surgical History:  Procedure Laterality Date  . ABDOMINAL HYSTERECTOMY     TAH BSO  . anal fistula repair    . CARDIAC CATHETERIZATION  2011 Dec.   stent mid right coronary,Primus element stent  . CATARACT EXTRACTION    . COLPOSCOPY    . OOPHORECTOMY     BSO   . OTHER SURGICAL HISTORY     hysterectomy    Family History: Family History  Problem Relation Age of Onset  . Heart failure Mother   . Stomach cancer Mother   . Heart disease Father   . Diabetes Father   . Colon cancer Sister   . Heart disease Brother     Social History: Social History   Social History  . Marital status: Single    Spouse name: N/A  . Number of children: N/A  . Years of education: N/A   Occupational History  . Retired Retired   Social History Main Topics  . Smoking status: Current Every Day Smoker    Packs/day: 0.50    Years: 47.00    Types: Cigarettes  . Smokeless tobacco: Never Used  . Alcohol use 0.0 oz/week     Comment: Rare  . Drug use: No  . Sexual activity: No     Comment: HYST-1st intercourse 71 yo-More than 5 partners   Other Topics Concern  . None   Social History Narrative  . None    Allergies:  Allergies  Allergen Reactions  . Crestor [Rosuvastatin Calcium]     intolerant  . Prednisone     "just makes me sick"  . Sulfa Antibiotics Swelling  . Thyroid Hormones Other (See Comments)    Paranoid   . Niaspan [Niacin] Nausea Only    nausea    Objective:    Vital Signs:   Temp:  [98 F (36.7 C)-98.8 F (37.1 C)] 98.4 F (36.9 C) (06/12 0700) Pulse Rate:  [91-158] 110 (06/12 0800) Resp:  [6-37] 29 (06/12 0800) BP: (62-137)/(43-81) 124/59 (06/12 0800) SpO2:  [84 %-97 %] 94 % (06/12 0800) Weight:  [164 lb 3.9 oz (74.5 kg)] 164 lb 3.9 oz (74.5 kg) (06/12 0500) Last BM Date: 07/19/16  Weight change: Filed Weights   07/23/16 0545 07/24/16 0500 07/25/16 0500  Weight: 173 lb 11.6 oz (78.8 kg) 167 lb 1.6 oz (75.8 kg) 164 lb 3.9 oz (74.5 kg)    Intake/Output:   Intake/Output Summary (Last 24 hours) at 07/25/16 0850 Last data filed at 07/25/16 0800  Gross per 24 hour  Intake           726.53 ml  Output             2525 ml  Net         -1798.47 ml     Physical Exam: CVP 5 General:  Elderly appearing. No resp  difficulty. Sitting in the chair.  HEENT: normal Neck: supple. JVP 6-7. Carotids 2+ bilat; no bruits. No lymphadenopathy or thyromegaly appreciated. Cor: PMI nondisplaced. Regular rate & rhythm.  No rubs, gallops or murmurs. Lungs: clear on 4 liters [place] oxygen. Decreased breath sounds throughout Abdomen: soft, nontender, nondistended. No hepatosplenomegaly. No bruits or masses. Good bowel sounds. Extremities: no cyanosis, clubbing, rash, R and LLE trace -1+edema. Erythema on anterior aspect.  Neuro: alert & orientedx3, cranial nerves grossly intact. moves all 4 extremities w/o difficulty. Affect pleasant  Telemetry: Sinus Tach 100s   Labs: Basic Metabolic Panel:  Recent Labs Lab 07/21/16 0101  07/21/16 1740 07/21/16 2329 07/22/16 0415 07/23/16 0445 07/24/16 0426  NA 131*  < > 137 137 138 139 137  K 5.5*  < > 4.8 4.7 4.5 4.3 4.0  CL 96*  < > 97* 94* 97* 93* 87*  CO2 27  < > 32 34* 33* 39* 42*  GLUCOSE 198*  < > 161* 126* 137* 130* 177*  BUN 73*  < > 57* 53* 47* 24* 19  CREATININE 1.69*  < > 1.39* 1.52* 1.21* 0.99 0.89  CALCIUM 8.3*  < > 8.0* 8.4* 8.2* 8.1* 8.2*  MG 2.5*  --   --   --  2.1 1.7 1.8  PHOS 5.0*  --   --   --  4.3 3.0 2.9  < > = values in this interval not displayed.  Liver Function Tests: No results for input(s): AST, ALT, ALKPHOS, BILITOT, PROT, ALBUMIN in the last 168 hours. No results for input(s): LIPASE, AMYLASE in the last 168 hours. No results for input(s): AMMONIA in the last 168 hours.  CBC:  Recent Labs Lab 07/20/16 2107 07/22/16 0415 07/23/16 0445 07/24/16 0426  WBC 11.6* 13.0* 13.3* 11.7*  HGB 17.2* 16.8* 16.5* 16.1*  HCT 54.7* 53.2* 53.9* 52.2*  MCV 93.8 93.3 94.9 94.4  PLT 239 235 199 162    Cardiac Enzymes:  Recent Labs Lab 07/21/16 0101 07/21/16 0410 07/21/16 1057  TROPONINI 0.06* 0.06* 0.06*    BNP: BNP (last 3 results)  Recent Labs  07/20/16 2107  BNP 1,113.9*    ProBNP (last 3 results) No results for input(s):  PROBNP in the last 8760 hours.   CBG:  Recent Labs Lab 07/24/16 0753 07/24/16 1135 07/24/16 1626 07/24/16 2233 07/25/16 0755  GLUCAP 162* 151* 114* 197* 158*    Coagulation Studies: No results for input(s): LABPROT, INR in the last 72 hours.  Other results: EKG: NSR 83 bpm NW axis. Low volts in limbs Personally reviewed  Imaging:  No results found.   Medications:     Current Medications: . aspirin EC  81 mg Oral Daily  . Chlorhexidine Gluconate Cloth  6 each Topical Daily  . furosemide  40 mg Intravenous Q12H  . insulin aspart  0-5 Units Subcutaneous QHS  . insulin aspart  0-9 Units Subcutaneous TID WC  . ipratropium-albuterol  3 mL Nebulization TID  . mouth rinse  15 mL Mouth Rinse BID  . nicotine  14 mg Transdermal Daily  . sodium chloride flush  10-40 mL Intracatheter Q12H     Infusions: . sodium chloride 250 mL (07/25/16 0700)  . cefTRIAXone (ROCEPHIN)  IV Stopped (07/24/16 2300)  . DOPamine 9 mcg/kg/min (07/25/16 0700)      Assessment/Plan   1.  Acute Respiratory Failure -required short term bipap on admit. Oxygen weaned to 4 liters [place].  2. Shock ---> RV Failure. Hypotensive on admit. Improved with dopamine.  Wean to day.  3. Acute RV Failure- Noted on ECHO LVEF preserved but RV mod-severely reduced. . CTA negative for PE. Volume overload on admit. Volume status  improving  With IV lasix. CVP 5. Overall diuresed 15 pounds with IV lasix + dopamine. Continue IV lasix 40 mg twice a day. Cut back dopamine.  Will need RHC to further evaluated hemodynamics.  Add ted hose.  4. AKI- creatinine on admit 1.79. Likely elevated with hypotension. ---> todays creatinine down 0.89  5. ?CAP- initially on rocephin/azithromycin. Antibiotics stopped.  CAD- LHC 2011s/p stenting of the mid RCA in Dec 2011 with a DES. Residual 70% stenosis in the mid LCx and 90% in the distal LCx.  Troponin on admit 0.06>0.06>0.06 On aspirin. She is intolerant to niacin and crestor. Pharm D  will discuss with her. Consider pravastatin? Hold bb with shock.   Consult cardiac rehab.    Length of Stay: [place], NP  07/25/2016, 8:50 AM  Advanced Heart Failure Team Pager [phone] (M-F; [place])  Please contact [facility] for night-coverage after hours (4p -7a ) and weekends on [email or web address]  Patient seen and examined with Darrick Grinder, NP. We discussed all aspects of the encounter. I agree with the assessment and plan as stated above.   I have reviewed patient's echo and CT images personally.   72 y/o woman with h/o COPD (with ongoing tobacco use), CAD s/p previous RCA infarct and probable undiagnosed OSA admitted with cardiogenic shock and AKI. Echo shows normal LV function with severe RV hypokinesis. CT with no evidence of PE but dilated pulmonary arteries suggestive of PAH. Although she is a difficult historian her main complaints prior to admission were severe fatigue and hypersomnolence and LE edema.   She has improved with dopamine support. Suspect she has advanced cor pulmonale due to longstanding PAH in setting of COPD and undiagnosed OSA. Given previous RCA infarct, RV infarct is also in the differential but presence of PAH argues against this. Finally, amyloidosis is a consideration.  Will continue dopamine support. Plan R/L cath tomorrow. Send SPEP. May need cMRI. Will need PFTs and sleep study.   Glori Bickers, MD  8:59 PM

## 2016-07-26 ENCOUNTER — Encounter (HOSPITAL_COMMUNITY): Payer: Self-pay | Admitting: Internal Medicine

## 2016-07-26 ENCOUNTER — Encounter (HOSPITAL_COMMUNITY)
Admission: EM | Disposition: A | Discharge status: Home / Self Care | Payer: Self-pay | Source: Home / Self Care | Attending: Pulmonary Disease

## 2016-07-26 ENCOUNTER — Inpatient Hospital Stay (HOSPITAL_COMMUNITY): Payer: Medicare Other

## 2016-07-26 ENCOUNTER — Ambulatory Visit (HOSPITAL_COMMUNITY): Admission: RE | Admit: 2016-07-26 | Payer: Medicare Other | Source: Ambulatory Visit | Admitting: Internal Medicine

## 2016-07-26 DIAGNOSIS — I2510 Atherosclerotic heart disease of native coronary artery without angina pectoris: Secondary | ICD-10-CM

## 2016-07-26 DIAGNOSIS — N179 Acute kidney failure, unspecified: Secondary | ICD-10-CM

## 2016-07-26 DIAGNOSIS — G4733 Obstructive sleep apnea (adult) (pediatric): Secondary | ICD-10-CM

## 2016-07-26 DIAGNOSIS — I2721 Secondary pulmonary arterial hypertension: Secondary | ICD-10-CM

## 2016-07-26 HISTORY — PX: RIGHT/LEFT HEART CATH AND CORONARY ANGIOGRAPHY: CATH118266

## 2016-07-26 LAB — POCT I-STAT 3, VENOUS BLOOD GAS (G3P V)
ACID-BASE EXCESS: 19 mmol/L — AB (ref 0.0–2.0)
Acid-Base Excess: 13 mmol/L — ABNORMAL HIGH (ref 0.0–2.0)
Acid-Base Excess: 14 mmol/L — ABNORMAL HIGH (ref 0.0–2.0)
Bicarbonate: 47.9 mmol/L — ABNORMAL HIGH (ref 20.0–28.0)
Bicarbonate: 41.9 mmol/L — ABNORMAL HIGH (ref 20.0–28.0)
Bicarbonate: 42.6 mmol/L — ABNORMAL HIGH (ref 20.0–28.0)
O2 Saturation: 71 %
O2 Saturation: 69 %
O2 Saturation: 69 %
PCO2 VEN: 64.1 mmHg — AB (ref 44.0–60.0)
PCO2 VEN: 65.3 mmHg — AB (ref 44.0–60.0)
PH VEN: 7.469 — AB (ref 7.250–7.430)
PH VEN: 7.423 (ref 7.250–7.430)
PH VEN: 7.423 (ref 7.250–7.430)
PO2 VEN: 37 mmHg (ref 32.0–45.0)
PO2 VEN: 37 mmHg (ref 32.0–45.0)
TCO2: 50 mmol/L (ref 0–100)
TCO2: 44 mmol/L (ref 0–100)
TCO2: 45 mmol/L (ref 0–100)
pCO2, Ven: 66 mmHg — ABNORMAL HIGH (ref 44.0–60.0)
pO2, Ven: 37 mmHg (ref 32.0–45.0)

## 2016-07-26 LAB — CBC
HEMATOCRIT: 52.5 % — AB (ref 36.0–46.0)
HEMOGLOBIN: 16 g/dL — AB (ref 12.0–15.0)
MCH: 28.6 pg (ref 26.0–34.0)
MCHC: 30.5 g/dL (ref 30.0–36.0)
MCV: 93.8 fL (ref 78.0–100.0)
PLATELETS: 133 10*3/uL — AB (ref 150–400)
RBC: 5.6 MIL/uL — AB (ref 3.87–5.11)
RDW: 15.1 % (ref 11.5–15.5)
WBC: 9.1 10*3/uL (ref 4.0–10.5)

## 2016-07-26 LAB — GLUCOSE, CAPILLARY
GLUCOSE-CAPILLARY: 86 mg/dL (ref 65–99)
GLUCOSE-CAPILLARY: 111 mg/dL — AB (ref 65–99)
Glucose-Capillary: 142 mg/dL — ABNORMAL HIGH (ref 65–99)
Glucose-Capillary: 145 mg/dL — ABNORMAL HIGH (ref 65–99)

## 2016-07-26 LAB — CULTURE, BLOOD (ROUTINE X 2)
Culture: NO GROWTH
Culture: NO GROWTH
SPECIAL REQUESTS: ADEQUATE
SPECIAL REQUESTS: ADEQUATE

## 2016-07-26 LAB — POCT I-STAT 3, ART BLOOD GAS (G3+)
Acid-Base Excess: 12 mmol/L — ABNORMAL HIGH (ref 0.0–2.0)
Bicarbonate: 40.1 mmol/L — ABNORMAL HIGH (ref 20.0–28.0)
O2 Saturation: 88 %
PCO2 ART: 63.1 mmHg — AB (ref 32.0–48.0)
PH ART: 7.41 (ref 7.350–7.450)
TCO2: 42 mmol/L (ref 0–100)
pO2, Arterial: 57 mmHg — ABNORMAL LOW (ref 83.0–108.0)

## 2016-07-26 LAB — BASIC METABOLIC PANEL
ANION GAP: 8 (ref 5–15)
BUN: 16 mg/dL (ref 6–20)
CHLORIDE: 89 mmol/L — AB (ref 101–111)
CO2: 36 mmol/L — AB (ref 22–32)
Calcium: 8 mg/dL — ABNORMAL LOW (ref 8.9–10.3)
Creatinine, Ser: 0.85 mg/dL (ref 0.44–1.00)
GFR calc Af Amer: >60 mL/min (ref >60)
GLUCOSE: 160 mg/dL — AB (ref 65–99)
POTASSIUM: 4.1 mmol/L (ref 3.5–5.1)
Sodium: 133 mmol/L — ABNORMAL LOW (ref 135–145)

## 2016-07-26 LAB — PROTIME-INR
INR: 1.12
Prothrombin Time: 14.4 seconds (ref 11.4–15.2)

## 2016-07-26 SURGERY — RIGHT/LEFT HEART CATH AND CORONARY ANGIOGRAPHY
Anesthesia: LOCAL

## 2016-07-26 MED ORDER — VANCOMYCIN HCL IN DEXTROSE 1-5 GM/200ML-% IV SOLN
1000.0000 mg | Freq: Once | INTRAVENOUS | Status: AC
  Administered 2016-07-26: 1000 mg via INTRAVENOUS

## 2016-07-26 MED ORDER — SODIUM CHLORIDE 0.9% FLUSH
3.0000 mL | Freq: Two times a day (BID) | INTRAVENOUS | Status: DC
  Administered 2016-07-26: 10 mL via INTRAVENOUS
  Administered 2016-07-27: 3 mL via INTRAVENOUS

## 2016-07-26 MED ORDER — LIDOCAINE HCL (PF) 1 % IJ SOLN
INTRAMUSCULAR | Status: DC | PRN
  Administered 2016-07-26 (×2): 2 mL

## 2016-07-26 MED ORDER — VANCOMYCIN HCL IN DEXTROSE 1-5 GM/200ML-% IV SOLN
INTRAVENOUS | Status: AC
  Filled 2016-07-26: qty 200

## 2016-07-26 MED ORDER — HEPARIN SODIUM (PORCINE) 1000 UNIT/ML IJ SOLN
INTRAMUSCULAR | Status: DC | PRN
  Administered 2016-07-26: 3000 [IU] via INTRAVENOUS

## 2016-07-26 MED ORDER — IOPAMIDOL (ISOVUE-370) INJECTION 76%
INTRAVENOUS | Status: DC | PRN
  Administered 2016-07-26: 35 mL via INTRA_ARTERIAL

## 2016-07-26 MED ORDER — ONDANSETRON HCL 4 MG/2ML IJ SOLN: 4.0000 mg | Freq: Four times a day (QID) | INTRAMUSCULAR | Status: DC | PRN

## 2016-07-26 MED ORDER — HEPARIN (PORCINE) IN NACL 2-0.9 UNIT/ML-% IJ SOLN
INTRAMUSCULAR | Status: AC | PRN
  Administered 2016-07-26: 1000 mL

## 2016-07-26 MED ORDER — SODIUM CHLORIDE 0.9 % IV SOLN: 250.0000 mL | INTRAVENOUS | Status: DC | PRN

## 2016-07-26 MED ORDER — SODIUM CHLORIDE 0.9% FLUSH: 3.0000 mL | INTRAVENOUS | Status: DC | PRN

## 2016-07-26 MED ORDER — ACETAMINOPHEN 325 MG PO TABS: 650.0000 mg | ORAL_TABLET | ORAL | Status: DC | PRN

## 2016-07-26 MED ORDER — FENTANYL CITRATE (PF) 100 MCG/2ML IJ SOLN
INTRAMUSCULAR | Status: DC | PRN
Start: 2016-07-26 — End: 2016-07-26
  Administered 2016-07-26 (×2): 25 ug via INTRAVENOUS

## 2016-07-26 SURGICAL SUPPLY — 15 items
CATH BALLN WEDGE 5F 110CM (CATHETERS) ×2 IMPLANT
CATH INFINITI 5 FR JL3.5 (CATHETERS) ×2 IMPLANT
CATH INFINITI JR4 5F (CATHETERS) ×2 IMPLANT
DEVICE RAD COMP TR BAND LRG (VASCULAR PRODUCTS) ×2 IMPLANT
GLIDESHEATH INTRODUCER 5F 10CM (SHEATH) ×2 IMPLANT
GLIDESHEATH SLEND SS 6F .021 (SHEATH) ×2 IMPLANT
GUIDEWIRE .025 260CM (WIRE) ×2 IMPLANT
GUIDEWIRE INQWIRE 1.5J.035X260 (WIRE) ×1 IMPLANT
INQWIRE 1.5J .035X260CM (WIRE) ×2
KIT HEART LEFT (KITS) ×2 IMPLANT
PACK CARDIAC CATHETERIZATION (CUSTOM PROCEDURE TRAY) ×2 IMPLANT
TRANSDUCER W/STOPCOCK (MISCELLANEOUS) ×2 IMPLANT
TUBING CIL FLEX 10 FLL-RA (TUBING) ×2 IMPLANT
WIRE EMERALD 3MM-J .025X260CM (WIRE) ×2 IMPLANT
WIRE HI TORQ VERSACORE J 260CM (WIRE) ×2 IMPLANT

## 2016-07-26 NOTE — Progress Notes (Signed)
Pt transported to cath lab. VSS during transported.  Cath lab teamed informed of inability to get L AC PIV per order. Per cath lab staff, Staff will stick in lab.

## 2016-07-26 NOTE — Plan of Care (Signed)
Problem: Activity: Goal: Risk for activity intolerance will decrease Outcome: Progressing PT ordered and pt walking with nursing staff

## 2016-07-26 NOTE — CV Procedure (Signed)
Cath note:  Findings:  Done on dopamine 51mcg/kg/min  Ao = 110/47 (72) LV = 120/10 RA = 5 RV = 47/7 PA = 42/16 (27) PCW = 6 Fick cardiac output/index = 5.7/3.4 PVR = 4.2 WU SVR 945 Ao sat =  88% PA sat = 71%, 69% High SVC sat = 69%  Assessment: 1. Heavily calcified coronary arteries with only minimal luminal CAD. Widely patent RCA stent 2. LVEF 70% without regional wall motion abnormalities 3. Mild PAH with normal cardiac output on low-dose dopamine 4. No intracardiac shunt 4. PAD - central aortic pressure ~39mmHG greater than radial artery pressure or cuff pressure  Plan/Discussion:  Hemodynamics much better than expected. She is now dry. Stop diuretics. Wean dopamine to off.  Glori Bickers, MD  12:58 PM

## 2016-07-26 NOTE — Progress Notes (Signed)
[id number] PULMONARY / CRITICAL CARE MEDICINE   Name: Deanna Johnston MRN: [id number] DOB: Oct 08, 1945    ADMISSION DATE:  07/20/2016 CONSULTATION DATE:  07/21/2016  REFERRING MD:  Dr. Jori Moll   CHIEF COMPLAINT:  Dyspnea   HISTORY OF PRESENT ILLNESS:   71 year old female with PMH of COPD (Active smoker, 40-50 year 2 ppd, reports cut back to 1/2 ppd), CAD s/p stents in 2011, HLD, MI. Reports at baseline is SOB with chronic cough.   Presents to ED on 6/7 with progressive dyspnea for the last 2 weeks with increased swelling to BLE. Upon arrival to ED BNP 1113.9, WBC 11.6, Lactic 1.45, ABG 7.3/60/63. Hypotensive with systolic 34-19, oxygenation 90s on 4L [place], received 500 ml bolus and Rocephin/Azithromycin. Cardiology was consulted who recommended diuresis. PCCM asked to admit.   SUBJECTIVE:   Remains off BiPAP, on 4L [place].  No complaints. Lasix stopped.  RHC planned for this afternoon 6/13.  CVP 7 this AM.  VITAL SIGNS: BP (!) 97/48   Pulse 99   Temp 98.2 F (36.8 C) (Oral)   Resp (!) 23   Ht 4\' 10"  (1.473 m)   Wt 74 kg (163 lb 2.3 oz)   SpO2 (!) 88%   BMI 34.10 kg/m   HEMODYNAMICS: CVP:  [3 mmHg] 3 mmHg  VENTILATOR SETTINGS:    INTAKE / OUTPUT: I/O last 3 completed shifts: In: 1443.9 [P.O.:360; I.V.:983.9; IV Piggyback:100] Out: 4100 [Urine:4100]  PHYSICAL EXAMINATION:  General:  WNWD NAD Sitting in chair visiting with family HEENT: MM pink/moist, no jvd/lan PSY: WNL Neuro: Intact CV: HSR RRR PULM: Clear bilaterally QQ:IWLN, non-tender, bsx4 active  Extremities: warm/dry, ++edema  Skin: Lower ext rash(dc abx 6/12)  LABS:  BMET  Recent Labs Lab 07/23/16 0445 07/24/16 0426 07/26/16 0510  NA 139 137 133*  K 4.3 4.0 4.1  CL 93* 87* 89*  CO2 39* 42* 36*  BUN 24* 19 16  CREATININE 0.99 0.89 0.85  GLUCOSE 130* 177* 160*   Electrolytes  Recent Labs Lab 07/22/16 0415 07/23/16 0445 07/24/16 0426 07/26/16 0510  CALCIUM 8.2* 8.1* 8.2* 8.0*  MG 2.1 1.7 1.8  --   PHOS  4.3 3.0 2.9  --    CBC  Recent Labs Lab 07/23/16 0445 07/24/16 0426 07/26/16 0510  WBC 13.3* 11.7* 9.1  HGB 16.5* 16.1* 16.0*  HCT 53.9* 52.2* 52.5*  PLT 199 162 133*   Coag's  Recent Labs Lab 07/26/16 0510  INR 1.12    Sepsis Markers  Recent Labs Lab 07/20/16 2244 07/21/16 0101 07/21/16 0410 07/22/16 0415  LATICACIDVEN 1.45  --   --   --   PROCALCITON  --  <0.10 <0.10 <0.10   ABG  Recent Labs Lab 07/21/16 0304 07/21/16 0419 07/21/16 1040  PHART 7.199* 7.254* 7.292*  PCO2ART 77.7* 72.0* 67.1*  PO2ART 58.0* 62.0* 65.0*   Liver Enzymes No results for input(s): AST, ALT, ALKPHOS, BILITOT, ALBUMIN in the last 168 hours.  Cardiac Enzymes  Recent Labs Lab 07/21/16 0101 07/21/16 0410 07/21/16 1057  TROPONINI 0.06* 0.06* 0.06*   Glucose  Recent Labs Lab 07/24/16 2233 07/25/16 0755 07/25/16 1132 07/25/16 1600 07/25/16 2204 07/26/16 0750  GLUCAP 197* 158* 159* 117* 158* 142*   Imaging Dg Chest Port 1 View  Result Date: 07/26/2016 CLINICAL DATA:  Shortness of breath, respiratory failure. History of CHF, previous MI, COPD, current smoker. EXAM: PORTABLE CHEST 1 VIEW COMPARISON:  Portable chest x-ray of June eleventh 2018 FINDINGS: The lungs are adequately inflated.  The interstitial markings remain increased. The hemidiaphragms are better demonstrated. Coarse lung markings at both bases are slightly less conspicuous. The cardiac silhouette is enlarged. The central pulmonary vascularity remains engorged. There is calcification in the wall of the aortic arch. There is no significant pleural effusion. IMPRESSION: CHF superimposed upon COPD. Probable bibasilar atelectasis or interstitial pneumonia. Overall there has been mild interval improvement in the appearance of the pulmonary interstitium since yesterday's study. Electronically Signed   By: David  Martinique M.D.   On: 07/26/2016 08:05   STUDIES:  CXR 6/7 > Mild cardiomegaly in vascular congestion.  Heterogeneous bibasilar opacities may be atelectasis, PNA, or pulmonary edema  ECHO 6/8 > EF 65-70%, G1DD, mod to severely reduced RV systolic function. CTA chest 6/8 > No PE, RML atelectasis, Minimal effusions. Enlarged PA LE dupex 3/8 > No DVT RHC 6/13 >   CULTURES: Blood 6/7 > Sputum 6/8 >   ANTIBIOTICS: Azithromycin 6/7 > 6/8 Rocephin 6/7 >  6/12  SIGNIFICANT EVENTS: 6/7 > Presents to ED SOB  LINES/TUBES: R PICC line 6/10>>>  DISCUSSION: 71 year old female presents to ED with reported 2 weeks of progressive dyspnea and BLE swelling. BNP 1113.9. Cardiology following.   ASSESSMENT / PLAN:  Acute on Chronic Hypoxic/Hypercarbic Respiratory Failure. Possible AECOPD - s/p ceftriaxone empirically. PE, DVT ruled out ? OSA. Tobacco dependence. P:   Wean down O2 as tolerated and establish home O2 needs Change BiPAP to PRN Continue duonebs Sleep study as outpatient Likely hypoxemic for years and now we are seeing effect, will need O2 at home upon discharge, suspect 2-4 l/m range  Shock - likely multifactorial from RV failure (Echo 07/21/16 with EF 65-70%, G1DD, mod to severely reduced RV systolic function).  Another possibility includes chronic RV failure due to underlying lung disease with cor pulmonale. H/O CAD s/p stenting, HLD. P:  Cardiology Following, appreciate input Continue to hold lasix for now, await RHC results to determine whether needs to continue or hold Dopamine wean as tolerated per cards Hold home lopressor, valsartan Continue aspirin  AKI. Volume Overload - improving gradually. Hyponatremia - due to volume overload. P:   Free water restriction Replete lytes BMET in AM  FAMILY  - Updates: Patient and niece (DPOA) updated bedside 6/11, after discussion, decision was made to make LCB with no intubation, CPR or cardioversion.  Pressors only. 6/12 pt and niece updated. Further discussion concerning tobcco cessation.   - Inter-disciplinary family meet or  Palliative Care meeting due by:  07/28/2016  CC time 2 min   Montey Hora, [place] - [place] [facility] Pager: [phone]  or [phone] 07/26/2016, 8:20 AM  Attending Note:  71 year old female with cardiogenic shock due to right heart failure and renal failure.  On exam, lungs are clear.  I reviewed CXR myself, pulmonary edema noted.  Hold further diureses for today.  Right heart cath today.  Continue dopamine for BP support.  Hold in the ICU for pressors.  Will need additional diureses after right heart cath but hold for now given contrast.  The patient is critically ill with multiple organ systems failure and requires high complexity decision making for assessment and support, frequent evaluation and titration of therapies, application of advanced monitoring technologies and extensive interpretation of multiple databases.   Critical Care Time devoted to patient care services described in this note is  35  Minutes. This time reflects time of care of this signee  Dr Jennet Maduro. This critical care time does not reflect procedure time, or teaching time or supervisory time of PA/NP/Med student/Med Resident etc but could involve care discussion time.  Rush Farmer, M.D. [facility] Pulmonary/Critical Care Medicine. Pager: [phone]. After hours pager: [phone].

## 2016-07-26 NOTE — Progress Notes (Signed)
MD paged regarding post cath orders and blood pressure cuff readings of 70's-80's/40's-50's. Cath orders placed. Per MD systemic pressure is ~61mmHG higher than cuff pressures. Orders received to wean dopamine off.  Will continue to monitor pt closely.

## 2016-07-26 NOTE — Interval H&P Note (Signed)
History and Physical Interval Note:  07/26/2016 10:52 AM  Deanna Johnston  has presented today for surgery, with the diagnosis of hf  The various methods of treatment have been discussed with the patient and family. After consideration of risks, benefits and other options for treatment, the patient has consented to  Procedure(s): Right/Left Heart Cath and Coronary Angiography (N/A) and possible coronary angioplasty as a surgical intervention .  The patient's history has been reviewed, patient examined, no change in status, stable for surgery.  I have reviewed the patient's chart and labs.  Questions were answered to the patient's satisfaction.     Bensimhon, Quillian Quince

## 2016-07-26 NOTE — Progress Notes (Signed)
PT Cancellation Note  Patient Details Name: Deanna Johnston MRN: [id number] DOB: 05-28-1945   Cancelled Treatment:    Reason Eval/Treat Not Completed: Patient at procedure or test/unavailable;Patient not medically ready (cath lab)   Duncan Dull 07/26/2016, 12:26 PM

## 2016-07-26 NOTE — Progress Notes (Signed)
Advanced Heart Failure Rounding Note   Subjective:    Yesterday IV lasix stopped. CVP down to 2. Dopamine weaned down to 6 mcg.   Denies SOB/CP .     Objective:   Weight Range:  Vital Signs:   Temp:  [98.2 F (36.8 C)-99.2 F (37.3 C)] 98.2 F (36.8 C) (06/13 0751) Pulse Rate:  [81-116] 99 (06/13 0700) Resp:  [9-36] 23 (06/13 0700) BP: (80-124)/(45-69) 97/48 (06/13 0700) SpO2:  [86 %-100 %] 88 % (06/13 0700) Weight:  [163 lb 2.3 oz (74 kg)] 163 lb 2.3 oz (74 kg) (06/13 0500) Last BM Date: 07/19/16  Weight change: Filed Weights   07/24/16 0500 07/25/16 0500 07/26/16 0500  Weight: 167 lb 1.6 oz (75.8 kg) 164 lb 3.9 oz (74.5 kg) 163 lb 2.3 oz (74 kg)    Intake/Output:   Intake/Output Summary (Last 24 hours) at 07/26/16 0758 Last data filed at 07/26/16 0700  Gross per 24 hour  Intake           843.19 ml  Output             2150 ml  Net         -1306.81 ml     Physical Exam: CVP 2-3  General:  Elderly woman No resp difficulty. Sitting in the chair.  HEENT: normal Neck: supple. JVP flat . Carotids 2+ bilat; no bruits. No lymphadenopathy or thryomegaly appreciated. Cor: PMI nondisplaced. Regular rate & rhythm. No rubs, gallops or murmurs. Lungs: decreased BS throughout  No wheeze Abdomen: soft, nontender, nondistended. No hepatosplenomegaly. No bruits or masses. Good bowel sounds. Extremities: no cyanosis, clubbing, rash, R and LLE trace with ted hose on bilaterally  Erythema bilaterally Neuro: alert & orientedx3, cranial nerves grossly intact. moves all 4 extremities w/o difficulty. Affect pleasant  Telemetry: NSR 90s personally reviewed.  Personally reviewed   Labs: Basic Metabolic Panel:  Recent Labs Lab 07/21/16 0101  07/21/16 2329 07/22/16 0415 07/23/16 0445 07/24/16 0426 07/26/16 0510  NA 131*  < > 137 138 139 137 133*  K 5.5*  < > 4.7 4.5 4.3 4.0 4.1  CL 96*  < > 94* 97* 93* 87* 89*  CO2 27  < > 34* 33* 39* 42* 36*  GLUCOSE 198*  < > 126*  137* 130* 177* 160*  BUN 73*  < > 53* 47* 24* 19 16  CREATININE 1.69*  < > 1.52* 1.21* 0.99 0.89 0.85  CALCIUM 8.3*  < > 8.4* 8.2* 8.1* 8.2* 8.0*  MG 2.5*  --   --  2.1 1.7 1.8  --   PHOS 5.0*  --   --  4.3 3.0 2.9  --   < > = values in this interval not displayed.  Liver Function Tests: No results for input(s): AST, ALT, ALKPHOS, BILITOT, PROT, ALBUMIN in the last 168 hours. No results for input(s): LIPASE, AMYLASE in the last 168 hours. No results for input(s): AMMONIA in the last 168 hours.  CBC:  Recent Labs Lab 07/20/16 2107 07/22/16 0415 07/23/16 0445 07/24/16 0426 07/26/16 0510  WBC 11.6* 13.0* 13.3* 11.7* 9.1  HGB 17.2* 16.8* 16.5* 16.1* 16.0*  HCT 54.7* 53.2* 53.9* 52.2* 52.5*  MCV 93.8 93.3 94.9 94.4 93.8  PLT 239 235 199 162 133*    Cardiac Enzymes:  Recent Labs Lab 07/21/16 0101 07/21/16 0410 07/21/16 1057  TROPONINI 0.06* 0.06* 0.06*    BNP: BNP (last 3 results)  Recent Labs  07/20/16 2107  BNP 1,113.9*  ProBNP (last 3 results) No results for input(s): PROBNP in the last 8760 hours.    Other results:  Imaging:  No results found.   Medications:     Scheduled Medications: . aspirin EC  81 mg Oral Daily  . Chlorhexidine Gluconate Cloth  6 each Topical Daily  . insulin aspart  0-5 Units Subcutaneous QHS  . insulin aspart  0-9 Units Subcutaneous TID WC  . ipratropium-albuterol  3 mL Nebulization TID  . mouth rinse  15 mL Mouth Rinse BID  . nicotine  7 mg Transdermal Daily  . pravastatin  10 mg Oral q1800  . sodium chloride flush  10-40 mL Intracatheter Q12H  . sodium chloride flush  3 mL Intravenous Q12H     Infusions: . sodium chloride 250 mL (07/25/16 1900)  . sodium chloride    . sodium chloride    . DOPamine 6 mcg/kg/min (07/25/16 1900)     PRN Medications:  sodium chloride, sodium chloride, polyethylene glycol, sodium chloride flush, sodium chloride flush   Assessment/Plan/Discussion   1.  Acute Respiratory  Failure -required short term bipap on admit. Oxygen weaned to 4 liters [place].  2. Shock ---> RV Failure. Hypotensive on admit. Improved with dopamine.  Wean to day.  3. Acute RV Failure- Noted on ECHO LVEF preserved but RV mod-severely reduced. . CTA negative for PE. Volume overload on admit. Volume status improving. CVP 3.  IV lasix stopped. Overall diuresed 16 pounds. Unable to wean dopamine. Remains at 6 mcg.  Try to wean dopamine.  Will need RHC to further evaluated hemodynamics. Will set up cMRI to assess for scar/infiltrative disease. SPEP and UPEP ordered.  Add ted hose.  4. AKI- creatinine on admit 1.79. Likely elevated with hypotension. ---> creatinine down to 0.85.   5. ?CAP- initially on rocephin/azithromycin. Antibiotics stopped.  6. CAD- LHC 2011s/p stenting of the mid RCA in Dec 2011 with a DES. Residual 70% stenosis in the mid LCx and 90% in the distal LCx.  Troponin on admit 0.06>0.06>0.06 On aspirin. She is intolerant to niacin and crestor. Started pravastatin. Hold bb with shock.  7. Suspect OSA- Will need outpatient sleep study.   Plan for RHC/LHC later today.   Length of Stay: [place] NP-C  07/26/2016, 7:58 AM  Advanced Heart Failure Team Pager [phone] (M-F; 7a - 4p)  Please contact [facility] for night-coverage after hours (4p -7a ) and weekends on [email or web address]  Patient seen and examined with Darrick Grinder, NP. We discussed all aspects of the encounter. I agree with the assessment and plan as stated above.   Suspect she has near end-stage PAH in setting of hypoxic lung disease (COPD and undiagnosed OSA/OHS). Ischemic RV failure and cardiac amyloidosis also possibility. Will plan R/L cath today and cMRI tomorrow. Wean dopamine as tolerated. May need SNF at d/c.   Discussed at length with patient and her niece (DPOA) at bedside. I did have concerns about possible early dementia but niece says that Ms. Fullenwider is very functional and had recently got worse after being  sick but now seems to be back to baseline.   Glori Bickers, MD  8:31 AM

## 2016-07-26 NOTE — H&P (View-Only) (Signed)
Advanced Heart Failure Rounding Note   Subjective:    Yesterday IV lasix stopped. CVP down to 2. Dopamine weaned down to 6 mcg.   Denies SOB/CP .     Objective:   Weight Range:  Vital Signs:   Temp:  [98.2 F (36.8 C)-99.2 F (37.3 C)] 98.2 F (36.8 C) (06/13 0751) Pulse Rate:  [81-116] 99 (06/13 0700) Resp:  [9-36] 23 (06/13 0700) BP: (80-124)/(45-69) 97/48 (06/13 0700) SpO2:  [86 %-100 %] 88 % (06/13 0700) Weight:  [163 lb 2.3 oz (74 kg)] 163 lb 2.3 oz (74 kg) (06/13 0500) Last BM Date: 07/19/16  Weight change: Filed Weights   07/24/16 0500 07/25/16 0500 07/26/16 0500  Weight: 167 lb 1.6 oz (75.8 kg) 164 lb 3.9 oz (74.5 kg) 163 lb 2.3 oz (74 kg)    Intake/Output:   Intake/Output Summary (Last 24 hours) at 07/26/16 0758 Last data filed at 07/26/16 0700  Gross per 24 hour  Intake           843.19 ml  Output             2150 ml  Net         -1306.81 ml     Physical Exam: CVP 2-3  General:  Elderly woman No resp difficulty. Sitting in the chair.  HEENT: normal Neck: supple. JVP flat . Carotids 2+ bilat; no bruits. No lymphadenopathy or thryomegaly appreciated. Cor: PMI nondisplaced. Regular rate & rhythm. No rubs, gallops or murmurs. Lungs: decreased BS throughout  No wheeze Abdomen: soft, nontender, nondistended. No hepatosplenomegaly. No bruits or masses. Good bowel sounds. Extremities: no cyanosis, clubbing, rash, R and LLE trace with ted hose on bilaterally  Erythema bilaterally Neuro: alert & orientedx3, cranial nerves grossly intact. moves all 4 extremities w/o difficulty. Affect pleasant  Telemetry: NSR 90s personally reviewed.  Personally reviewed   Labs: Basic Metabolic Panel:  Recent Labs Lab 07/21/16 0101  07/21/16 2329 07/22/16 0415 07/23/16 0445 07/24/16 0426 07/26/16 0510  NA 131*  < > 137 138 139 137 133*  K 5.5*  < > 4.7 4.5 4.3 4.0 4.1  CL 96*  < > 94* 97* 93* 87* 89*  CO2 27  < > 34* 33* 39* 42* 36*  GLUCOSE 198*  < > 126*  137* 130* 177* 160*  BUN 73*  < > 53* 47* 24* 19 16  CREATININE 1.69*  < > 1.52* 1.21* 0.99 0.89 0.85  CALCIUM 8.3*  < > 8.4* 8.2* 8.1* 8.2* 8.0*  MG 2.5*  --   --  2.1 1.7 1.8  --   PHOS 5.0*  --   --  4.3 3.0 2.9  --   < > = values in this interval not displayed.  Liver Function Tests: No results for input(s): AST, ALT, ALKPHOS, BILITOT, PROT, ALBUMIN in the last 168 hours. No results for input(s): LIPASE, AMYLASE in the last 168 hours. No results for input(s): AMMONIA in the last 168 hours.  CBC:  Recent Labs Lab 07/20/16 2107 07/22/16 0415 07/23/16 0445 07/24/16 0426 07/26/16 0510  WBC 11.6* 13.0* 13.3* 11.7* 9.1  HGB 17.2* 16.8* 16.5* 16.1* 16.0*  HCT 54.7* 53.2* 53.9* 52.2* 52.5*  MCV 93.8 93.3 94.9 94.4 93.8  PLT 239 235 199 162 133*    Cardiac Enzymes:  Recent Labs Lab 07/21/16 0101 07/21/16 0410 07/21/16 1057  TROPONINI 0.06* 0.06* 0.06*    BNP: BNP (last 3 results)  Recent Labs  07/20/16 2107  BNP 1,113.9*  ProBNP (last 3 results) No results for input(s): PROBNP in the last 8760 hours.    Other results:  Imaging:  No results found.   Medications:     Scheduled Medications: . aspirin EC  81 mg Oral Daily  . Chlorhexidine Gluconate Cloth  6 each Topical Daily  . insulin aspart  0-5 Units Subcutaneous QHS  . insulin aspart  0-9 Units Subcutaneous TID WC  . ipratropium-albuterol  3 mL Nebulization TID  . mouth rinse  15 mL Mouth Rinse BID  . nicotine  7 mg Transdermal Daily  . pravastatin  10 mg Oral q1800  . sodium chloride flush  10-40 mL Intracatheter Q12H  . sodium chloride flush  3 mL Intravenous Q12H     Infusions: . sodium chloride 250 mL (07/25/16 1900)  . sodium chloride    . sodium chloride    . DOPamine 6 mcg/kg/min (07/25/16 1900)     PRN Medications:  sodium chloride, sodium chloride, polyethylene glycol, sodium chloride flush, sodium chloride flush   Assessment/Plan/Discussion   1.  Acute Respiratory  Failure -required short term bipap on admit. Oxygen weaned to 4 liters [place].  2. Shock ---> RV Failure. Hypotensive on admit. Improved with dopamine.  Wean to day.  3. Acute RV Failure- Noted on ECHO LVEF preserved but RV mod-severely reduced. . CTA negative for PE. Volume overload on admit. Volume status improving. CVP 3.  IV lasix stopped. Overall diuresed 16 pounds. Unable to wean dopamine. Remains at 6 mcg.  Try to wean dopamine.  Will need RHC to further evaluated hemodynamics. Will set up cMRI to assess for scar/infiltrative disease. SPEP and UPEP ordered.  Add ted hose.  4. AKI- creatinine on admit 1.79. Likely elevated with hypotension. ---> creatinine down to 0.85.   5. ?CAP- initially on rocephin/azithromycin. Antibiotics stopped.  6. CAD- LHC 2011s/p stenting of the mid RCA in Dec 2011 with a DES. Residual 70% stenosis in the mid LCx and 90% in the distal LCx.  Troponin on admit 0.06>0.06>0.06 On aspirin. She is intolerant to niacin and crestor. Started pravastatin. Hold bb with shock.  7. Suspect OSA- Will need outpatient sleep study.   Plan for RHC/LHC later today.   Length of Stay: [place] NP-C  07/26/2016, 7:58 AM  Advanced Heart Failure Team Pager [phone] (M-F; 7a - 4p)  Please contact [facility] for night-coverage after hours (4p -7a ) and weekends on [email or web address]  Patient seen and examined with Darrick Grinder, NP. We discussed all aspects of the encounter. I agree with the assessment and plan as stated above.   Suspect she has near end-stage PAH in setting of hypoxic lung disease (COPD and undiagnosed OSA/OHS). Ischemic RV failure and cardiac amyloidosis also possibility. Will plan R/L cath today and cMRI tomorrow. Wean dopamine as tolerated. May need SNF at d/c.   Discussed at length with patient and her niece (DPOA) at bedside. I did have concerns about possible early dementia but niece says that Deanna Johnston is very functional and had recently got worse after being  sick but now seems to be back to baseline.   Glori Bickers, MD  8:31 AM

## 2016-07-26 NOTE — Care Management Note (Signed)
Case Management Note Marvetta Gibbons RN, BSN Unit 2W-Case Manager-- [place] coverage [phone]  Patient Details  Name: SOPHIE TAMEZ MRN: [id number] Date of Birth: [birth year]  Subjective/Objective:  Pt admitted with acute on chronic HF-- currently on lasix drip and IV dopamine                 Action/Plan: PTA pt lived at home- PCP- Kelton Pillar,  [place] to follow   Expected Discharge Date:                  Expected Discharge Plan:  [place]  In-House Referral:     Discharge planning Services  CM Consult  Post Acute Care Choice:  Durable Medical Equipment Choice offered to:  Patient  DME Arranged:  Oxygen DME Agency:     HH Arranged:    [place] Agency:     Status of Service:  In process, will continue to follow  If discussed at Long Length of Stay Meetings, dates discussed:    Discharge Disposition:   Additional Comments:  07/26/16- 1040- Marvetta Gibbons RN, CM- noted plans for cardiac cath today- remains on Dopamine wean as tolerated per cards- noted pt will most likely need home 02 at time of discharge- CM will follow for orders, will need qualifying ambulatory stats closer to discharge.   Dawayne Patricia, RN 07/26/2016, 10:40 AM

## 2016-07-27 ENCOUNTER — Inpatient Hospital Stay (HOSPITAL_COMMUNITY): Payer: Medicare Other

## 2016-07-27 DIAGNOSIS — I509 Heart failure, unspecified: Secondary | ICD-10-CM

## 2016-07-27 LAB — CBC
HEMATOCRIT: 50.6 % — AB (ref 36.0–46.0)
Hemoglobin: 15.5 g/dL — ABNORMAL HIGH (ref 12.0–15.0)
MCH: 28.9 pg (ref 26.0–34.0)
MCHC: 30.6 g/dL (ref 30.0–36.0)
MCV: 94.2 fL (ref 78.0–100.0)
Platelets: 124 10*3/uL — ABNORMAL LOW (ref 150–400)
RBC: 5.37 MIL/uL — AB (ref 3.87–5.11)
RDW: 15.1 % (ref 11.5–15.5)
WBC: 9 10*3/uL (ref 4.0–10.5)

## 2016-07-27 LAB — IMMUNOFIXATION ELECTROPHORESIS
IGA: 223 mg/dL (ref 64–422)
IGG (IMMUNOGLOBIN G), SERUM: 869 mg/dL (ref 700–1600)
IgM, Serum: 243 mg/dL — ABNORMAL HIGH (ref 26–217)
Total Protein ELP: 5.5 g/dL — ABNORMAL LOW (ref 6.0–8.5)

## 2016-07-27 LAB — MAGNESIUM: Magnesium: 2 mg/dL (ref 1.7–2.4)

## 2016-07-27 LAB — GLUCOSE, CAPILLARY
GLUCOSE-CAPILLARY: 93 mg/dL (ref 65–99)
Glucose-Capillary: 144 mg/dL — ABNORMAL HIGH (ref 65–99)
Glucose-Capillary: 80 mg/dL (ref 65–99)
Glucose-Capillary: 109 mg/dL — ABNORMAL HIGH (ref 65–99)

## 2016-07-27 LAB — BASIC METABOLIC PANEL
Anion gap: 6 (ref 5–15)
BUN: 15 mg/dL (ref 6–20)
CHLORIDE: 91 mmol/L — AB (ref 101–111)
CO2: 38 mmol/L — AB (ref 22–32)
Calcium: 8.6 mg/dL — ABNORMAL LOW (ref 8.9–10.3)
Creatinine, Ser: 0.85 mg/dL (ref 0.44–1.00)
GFR calc non Af Amer: >60 mL/min (ref >60)
Glucose, Bld: 83 mg/dL (ref 65–99)
POTASSIUM: 4.3 mmol/L (ref 3.5–5.1)
SODIUM: 135 mmol/L (ref 135–145)

## 2016-07-27 LAB — PROTEIN ELECTROPHORESIS, SERUM
A/G Ratio: 0.8 (ref 0.7–1.7)
ALPHA-1-GLOBULIN: 0.4 g/dL (ref 0.0–0.4)
ALPHA-2-GLOBULIN: 0.8 g/dL (ref 0.4–1.0)
Albumin ELP: 2.5 g/dL — ABNORMAL LOW (ref 2.9–4.4)
Beta Globulin: 0.7 g/dL (ref 0.7–1.3)
GLOBULIN, TOTAL: 3.1 g/dL (ref 2.2–3.9)
Gamma Globulin: 1.3 g/dL (ref 0.4–1.8)
Total Protein ELP: 5.6 g/dL — ABNORMAL LOW (ref 6.0–8.5)

## 2016-07-27 LAB — PHOSPHORUS: Phosphorus: 3.6 mg/dL (ref 2.5–4.6)

## 2016-07-27 MED ORDER — GADOBENATE DIMEGLUMINE 529 MG/ML IV SOLN
20.0000 mL | Freq: Once | INTRAVENOUS | Status: AC | PRN
  Administered 2016-07-27: 20 mL via INTRAVENOUS

## 2016-07-27 NOTE — Progress Notes (Signed)
Advanced Heart Failure Rounding Note   Subjective:    Yesterday dopamine weaned off. BP in cath stable. Blockages noted in her arm effecting BP in the ICU. Able to walk this morning of 6 liters oxygen.   Denies SOB/CP .   RHC/LHC   Mid RCA to Dist RCA lesion, 0 %stenosed.  Mid RCA lesion, 20 %stenosed.  Dist RCA lesion, 30 %stenosed.  Dist Cx lesion, 60 %stenosed.  Prox LAD to Mid LAD lesion, 20 %stenosed.  The left ventricular ejection fraction is greater than 65% by visual estimate.  Findings: Done on dopamine 4mcg/kg/min Ao = 110/47 (72) LV = 120/10 RA = 5 RV = 47/7 PA = 42/16 (27) PCW = 6 Fick cardiac output/index = 5.7/3.4 PVR = 4.2 WU SVR 945 Ao sat =  88% PA sat = 71%, 69% High SVC sat = 69% Assessment: 1. Heavily calcified coronary arteries with only minimal luminal CAD. Widely patent RCA stent 2. LVEF 70% without regional wall motion abnormalities 3. Mild PAH with normal cardiac output on low-dose dopamine 4. No intracardiac shunt 4. PAD - central aortic pressure ~36mmHG greater than radial artery pressure or cuff pressure   Objective:   Weight Range:  Vital Signs:   Temp:  [98 F (36.7 C)-98.4 F (36.9 C)] 98 F (36.7 C) (06/14 0300) Pulse Rate:  [72-96] 81 (06/14 0600) Resp:  [0-89] 26 (06/14 0600) BP: (56-139)/(29-87) 99/46 (06/14 0600) SpO2:  [86 %-95 %] 95 % (06/14 0722) Weight:  [164 lb 7.4 oz (74.6 kg)] 164 lb 7.4 oz (74.6 kg) (06/14 0400) Last BM Date: 07/26/16  Weight change: Filed Weights   07/25/16 0500 07/26/16 0500 07/27/16 0400  Weight: 164 lb 3.9 oz (74.5 kg) 163 lb 2.3 oz (74 kg) 164 lb 7.4 oz (74.6 kg)    Intake/Output:   Intake/Output Summary (Last 24 hours) at 07/27/16 0734 Last data filed at 07/27/16 0600  Gross per 24 hour  Intake            975.6 ml  Output             1850 ml  Net           -874.4 ml     Physical Exam: CVP 1 General:  Well appearing. No resp difficulty. Sitting in the chair HEENT:  normal Neck: supple. no JVD. Carotids 2+ bilat; no bruits. No lymphadenopathy or thryomegaly appreciated. Cor: PMI nondisplaced. Regular rate & rhythm. No rubs, gallops or murmurs. Lungs: clear on 6 liters oxygen  Abdomen: soft, nontender, nondistended. No hepatosplenomegaly. No bruits or masses. Good bowel sounds. Extremities: no cyanosis, clubbing, rash, R and LLE edema trace-1+ ted hose on bilaterally.  Neuro: alert & orientedx3, cranial nerves grossly intact. moves all 4 extremities w/o difficulty. Affect pleasant   Telemetry: NSR 80s personally reviewed.  Labs: Basic Metabolic Panel:  Recent Labs Lab 07/21/16 0101  07/22/16 0415 07/23/16 0445 07/24/16 0426 07/26/16 0510 07/27/16 0443  NA 131*  < > 138 139 137 133* 135  K 5.5*  < > 4.5 4.3 4.0 4.1 4.3  CL 96*  < > 97* 93* 87* 89* 91*  CO2 27  < > 33* 39* 42* 36* 38*  GLUCOSE 198*  < > 137* 130* 177* 160* 83  BUN 73*  < > 47* 24* 19 16 15   CREATININE 1.69*  < > 1.21* 0.99 0.89 0.85 0.85  CALCIUM 8.3*  < > 8.2* 8.1* 8.2* 8.0* 8.6*  MG 2.5*  --  2.1 1.7 1.8  --  2.0  PHOS 5.0*  --  4.3 3.0 2.9  --  3.6  < > = values in this interval not displayed.  Liver Function Tests: No results for input(s): AST, ALT, ALKPHOS, BILITOT, PROT, ALBUMIN in the last 168 hours. No results for input(s): LIPASE, AMYLASE in the last 168 hours. No results for input(s): AMMONIA in the last 168 hours.  CBC:  Recent Labs Lab 07/22/16 0415 07/23/16 0445 07/24/16 0426 07/26/16 0510 07/27/16 0443  WBC 13.0* 13.3* 11.7* 9.1 9.0  HGB 16.8* 16.5* 16.1* 16.0* 15.5*  HCT 53.2* 53.9* 52.2* 52.5* 50.6*  MCV 93.3 94.9 94.4 93.8 94.2  PLT 235 199 162 133* 124*    Cardiac Enzymes:  Recent Labs Lab 07/21/16 0101 07/21/16 0410 07/21/16 1057  TROPONINI 0.06* 0.06* 0.06*    BNP: BNP (last 3 results)  Recent Labs  07/20/16 2107  BNP 1,113.9*    ProBNP (last 3 results) No results for input(s): PROBNP in the last 8760 hours.    Other  results:  Imaging: Dg Chest Port 1 View  Result Date: 07/26/2016 CLINICAL DATA:  Shortness of breath, respiratory failure. History of CHF, previous MI, COPD, current smoker. EXAM: PORTABLE CHEST 1 VIEW COMPARISON:  Portable chest x-ray of June eleventh 2018 FINDINGS: The lungs are adequately inflated. The interstitial markings remain increased. The hemidiaphragms are better demonstrated. Coarse lung markings at both bases are slightly less conspicuous. The cardiac silhouette is enlarged. The central pulmonary vascularity remains engorged. There is calcification in the wall of the aortic arch. There is no significant pleural effusion. IMPRESSION: CHF superimposed upon COPD. Probable bibasilar atelectasis or interstitial pneumonia. Overall there has been mild interval improvement in the appearance of the pulmonary interstitium since yesterday's study. Electronically Signed   By: David  Martinique M.D.   On: 07/26/2016 08:05     Medications:     Scheduled Medications: . aspirin EC  81 mg Oral Daily  . Chlorhexidine Gluconate Cloth  6 each Topical Daily  . insulin aspart  0-5 Units Subcutaneous QHS  . insulin aspart  0-9 Units Subcutaneous TID WC  . ipratropium-albuterol  3 mL Nebulization TID  . mouth rinse  15 mL Mouth Rinse BID  . nicotine  7 mg Transdermal Daily  . pravastatin  10 mg Oral q1800  . sodium chloride flush  10-40 mL Intracatheter Q12H  . sodium chloride flush  3 mL Intravenous Q12H    Infusions: . sodium chloride Stopped (07/27/16 0600)  . sodium chloride    . DOPamine Stopped (07/26/16 1800)    PRN Medications: sodium chloride, sodium chloride, acetaminophen, ondansetron (ZOFRAN) IV, polyethylene glycol, sodium chloride flush, sodium chloride flush   Assessment/Plan/Discussion   1.  Acute Respiratory Failure -required short term bipap on admit.  Requiring 6 liters oxygen continuously. Will need home oxygen.   2. Shock ---> RV Failure. Hypotensive on admit. Improved  with dopamine.  Wean to day.  3. Acute RV Failure- Noted on ECHO LVEF preserved but RV mod-severely reduced. . CTA negative for PE. RHC with mild PAH and normal cardiac output.  Volume overload on admit. Off diuretics. Off dopamine.   . Will set up cMRI to assess for scar/infiltrative disease. SPEP and UPEP results pending.  4. AKI- creatinine on admit 1.79. Likely elevated with hypotension. ---> creatinine down to 0.85.   5. ?CAP- initially on rocephin/azithromycin. Antibiotics stopped.  6. CAD- LHC 2011s/p stenting of the mid RCA in Dec 2011 with a DES.  Residual 70% stenosis in the mid LCx and 90% in the distal LCx. LHC calcified coronary arteries minimal CAD and patent RCA stent.  Troponin on admit 0.06>0.06>0.06 On aspirin. She is intolerant to niacin and crestor.  Started pravastatin this admit. Hold bb with shock.  7. Suspect OSA- Will need outpatient sleep study.   Length of Stay: 7   Amy Clegg NP-C  07/27/2016, 7:34 AM  Advanced Heart Failure Team Pager [phone] (M-F; [place])  Please contact [facility] for night-coverage after hours (4p -7a ) and weekends on [email or web address]  Patient seen and examined with Darrick Grinder, NP. We discussed all aspects of the encounter. I agree with the assessment and plan as stated above.   Cath results reviewed with her and her daughter at length. Pulmonary pressures and cardiac output better than expected. Volume status looks good. That said, she still remains quite hypoxic. Discussed with CCM who is helping to arrange home O2. Will start torsemide tomorrow.   On cath central BP about 20-25 points higher than peripheral BP. So BP ok today.   Creatinine improved.   Will pan cMRI today to exclude RV infarct or infiltrative disease.   Glori Bickers, MD  6:08 PM

## 2016-07-27 NOTE — Progress Notes (Signed)
[id number] PULMONARY / CRITICAL CARE MEDICINE   Name: Deanna Johnston MRN: [id number] DOB: [birth year]    ADMISSION DATE:  07/20/2016 CONSULTATION DATE:  07/21/2016  REFERRING MD:  Dr. Jori Moll   CHIEF COMPLAINT:  Dyspnea   HISTORY OF PRESENT ILLNESS:   71 year old female with PMH of COPD (Active smoker, 40-50 year 2 ppd, reports cut back to 1/2 ppd), CAD s/p stents in 2011, HLD, MI. Reports at baseline is SOB with chronic cough.   Presents to ED on 6/7 with progressive dyspnea for the last 2 weeks with increased swelling to BLE. Upon arrival to ED BNP 1113.9, WBC 11.6, Lactic 1.45, ABG 7.3/60/63. Hypotensive with systolic 96-29, oxygenation 90s on 4L [place], received 500 ml bolus and Rocephin/Azithromycin. Cardiology was consulted who recommended diuresis. PCCM asked to admit.   SUBJECTIVE:   Heart cath performed yesterday, aortic vs radial pressure is different by 25 mmHg per cards, no events overnight, remains on HFNC at 6 liters.  Did not require BiPAP overnight and feels better  VITAL SIGNS: BP (!) 99/46   Pulse 81   Temp 98 F (36.7 C) (Oral)   Resp (!) 26   Ht 4\' 10"  (1.473 m)   Wt 164 lb 7.4 oz (74.6 kg)   SpO2 95%   BMI 34.37 kg/m   HEMODYNAMICS:    VENTILATOR SETTINGS:    INTAKE / OUTPUT: I/O last 3 completed shifts: In: 1557.6 [P.O.:720; I.V.:837.6] Out: 2650 [Urine:2650]  PHYSICAL EXAMINATION:  General:  WNWD NAD Sitting in chair NAD HEENT: MM pink/moist, no jvd/lan PSY: WNL, much more interactive this AM Neuro: Intact, moving all ext to commands CV: RRR, Nl S1/S2, -M/R/G. PULM: CTA bilaterally GI: Soft, NT, ND and +BS Extremities: -edema and -tenderness Skin: Lower ext rash (dc abx 6/12), resolved  LABS:  BMET  Recent Labs Lab 07/24/16 0426 07/26/16 0510 07/27/16 0443  NA 137 133* 135  K 4.0 4.1 4.3  CL 87* 89* 91*  CO2 42* 36* 38*  BUN 19 16 15   CREATININE 0.89 0.85 0.85  GLUCOSE 177* 160* 83   Electrolytes  Recent Labs Lab 07/23/16 0445  07/24/16 0426 07/26/16 0510 07/27/16 0443  CALCIUM 8.1* 8.2* 8.0* 8.6*  MG 1.7 1.8  --  2.0  PHOS 3.0 2.9  --  3.6   CBC  Recent Labs Lab 07/24/16 0426 07/26/16 0510 07/27/16 0443  WBC 11.7* 9.1 9.0  HGB 16.1* 16.0* 15.5*  HCT 52.2* 52.5* 50.6*  PLT 162 133* 124*   Coag's  Recent Labs Lab 07/26/16 0510  INR 1.12   Sepsis Markers  Recent Labs Lab 07/20/16 2244 07/21/16 0101 07/21/16 0410 07/22/16 0415  LATICACIDVEN 1.45  --   --   --   PROCALCITON  --  <0.10 <0.10 <0.10   ABG  Recent Labs Lab 07/21/16 0419 07/21/16 1040 07/26/16 1159  PHART 7.254* 7.292* 7.410  PCO2ART 72.0* 67.1* 63.1*  PO2ART 62.0* 65.0* 57.0*   Liver Enzymes No results for input(s): AST, ALT, ALKPHOS, BILITOT, ALBUMIN in the last 168 hours.  Cardiac Enzymes  Recent Labs Lab 07/21/16 0101 07/21/16 0410 07/21/16 1057  TROPONINI 0.06* 0.06* 0.06*   Glucose  Recent Labs Lab 07/25/16 1600 07/25/16 2204 07/26/16 0750 07/26/16 1335 07/26/16 1627 07/26/16 2151  GLUCAP 117* 158* 142* 145* 86 111*   Imaging Dg Chest Port 1 View  Result Date: 07/27/2016 CLINICAL DATA:  Patient admitted 07/20/2016 with a 2 week history of bilateral lower extremity swelling. Shortness of breath. EXAM: PORTABLE  CHEST 1 VIEW COMPARISON:  Single-view of the chest 07/26/2016 and 07/24/2016. FINDINGS: Right PICC is unchanged. Streaky bibasilar airspace opacities persist. Aeration in the right lung base is mildly improved. There is cardiomegaly and vascular congestion. Aortic atherosclerosis is noted. No pneumothorax. IMPRESSION: Cardiomegaly and vascular congestion. Streaky bibasilar airspace opacities are likely due to atelectasis and improved on the right. Atherosclerosis. Electronically Signed   By: Inge Rise M.D.   On: 07/27/2016 07:56   STUDIES:  CXR 6/7 > Mild cardiomegaly in vascular congestion. Heterogeneous bibasilar opacities may be atelectasis, PNA, or pulmonary edema  ECHO 6/8 > EF  65-70%, G1DD, mod to severely reduced RV systolic function. CTA chest 6/8 > No PE, RML atelectasis, Minimal effusions. Enlarged PA LE dupex 3/8 > No DVT RHC 6/13 > PAP 42/16 LHC 6/13 very calcified coronary arteries and patent RCA stent  CULTURES: Blood 6/7 > Sputum 6/8 >   ANTIBIOTICS: Azithromycin 6/7 > 6/8 Rocephin 6/7 >  6/12  SIGNIFICANT EVENTS: 6/7 > Presents to ED SOB  LINES/TUBES: R PICC line 6/10>>>  I reviewed CXR myself, PICC line in place.  DISCUSSION: 71 year old female presents to ED with reported 2 weeks of progressive dyspnea and BLE swelling. BNP 1113.9. Cardiology following.  Evidently aortic pressure is 25 mmHg higher than radial pressure, dopamine to off.  ASSESSMENT / PLAN:  Acute on Chronic Hypoxic/Hypercarbic Respiratory Failure. Possible AECOPD - s/p ceftriaxone empirically. PE, DVT ruled out ? OSA. Tobacco dependence. P:   Wean down O2 as tolerated and establish home O2 needs, 6L seems to be the level needed. D/C BiPAP. Continue duonebs. Sleep study as outpatient, will arrange upon discharge if going home and not to rehab. Likely hypoxemic for years and now we are seeing effect, will need O2 at home upon discharge.  Shock - likely multifactorial from RV failure (Echo 07/21/16 with EF 65-70%, G1DD, mod to severely reduced RV systolic function).  Another possibility includes chronic RV failure due to underlying lung disease with cor pulmonale. H/O CAD s/p stenting, HLD. P:  Cardiology Following, appreciate input Right heart failure medications to be established by cards D/C Dopamine Hold home lopressor, valsartan, defer to cards Continue aspirin  AKI. Volume Overload - improving gradually. Hyponatremia - due to volume overload. P:   Free water restriction Replete lytes BMET in AM  FAMILY  - Updates: Patient and DPOA updated bedside.  Discussed with advanced heart failure team, will start cardiac meds, d/c dopamine, d/c BiPAP, consult rehab  for ?CIR and will need to arrange for home O2.  - Inter-disciplinary family meet or Palliative Care meeting due by:  07/28/2016  Transfer to SDU.  Transfer care to [facility] with PCCM off 6/15.  Rush Farmer, M.D. [facility] Pulmonary/Critical Care Medicine. Pager: [phone]. After hours pager: [phone].

## 2016-07-27 NOTE — Progress Notes (Signed)
Pt has arrived to 2w from 2h. Pt oriented to room and staff. Vitals obtained. Telemetry box applied and CCMD notified. Pt denies needs at this time. Will continue current plan of care.   Grant Fontana BSN, RN

## 2016-07-27 NOTE — Progress Notes (Signed)
PT Cancellation Note  Patient Details Name: Deanna Johnston MRN: [id number] DOB: 08/08/1945   Cancelled Treatment:    Reason Eval/Treat Not Completed: Patient at procedure or test/unavailable.  Pt is at MRI.  PT will check back tomorrow.   Thanks,    Barbarann Ehlers. [place], [place], DPT [phone]   07/27/2016, 6:06 PM

## 2016-07-28 DIAGNOSIS — R0902 Hypoxemia: Secondary | ICD-10-CM

## 2016-07-28 DIAGNOSIS — E7800 Pure hypercholesterolemia, unspecified: Secondary | ICD-10-CM

## 2016-07-28 DIAGNOSIS — I10 Essential (primary) hypertension: Secondary | ICD-10-CM

## 2016-07-28 LAB — BASIC METABOLIC PANEL
Anion gap: 5 (ref 5–15)
BUN: 15 mg/dL (ref 6–20)
CALCIUM: 8.7 mg/dL — AB (ref 8.9–10.3)
CO2: 37 mmol/L — AB (ref 22–32)
Chloride: 95 mmol/L — ABNORMAL LOW (ref 101–111)
Creatinine, Ser: 0.81 mg/dL (ref 0.44–1.00)
GFR calc non Af Amer: >60 mL/min (ref >60)
Glucose, Bld: 96 mg/dL (ref 65–99)
Potassium: 4.1 mmol/L (ref 3.5–5.1)
Sodium: 137 mmol/L (ref 135–145)

## 2016-07-28 LAB — CBC
HEMATOCRIT: 50.7 % — AB (ref 36.0–46.0)
HEMOGLOBIN: 15.1 g/dL — AB (ref 12.0–15.0)
MCH: 28.2 pg (ref 26.0–34.0)
MCHC: 29.8 g/dL — ABNORMAL LOW (ref 30.0–36.0)
MCV: 94.8 fL (ref 78.0–100.0)
Platelets: 116 10*3/uL — ABNORMAL LOW (ref 150–400)
RBC: 5.35 MIL/uL — ABNORMAL HIGH (ref 3.87–5.11)
RDW: 15.2 % (ref 11.5–15.5)
WBC: 8 10*3/uL (ref 4.0–10.5)

## 2016-07-28 LAB — MAGNESIUM: Magnesium: 2.1 mg/dL (ref 1.7–2.4)

## 2016-07-28 LAB — GLUCOSE, CAPILLARY
GLUCOSE-CAPILLARY: 76 mg/dL (ref 65–99)
GLUCOSE-CAPILLARY: 118 mg/dL — AB (ref 65–99)
GLUCOSE-CAPILLARY: 129 mg/dL — AB (ref 65–99)
Glucose-Capillary: 112 mg/dL — ABNORMAL HIGH (ref 65–99)

## 2016-07-28 LAB — PHOSPHORUS: PHOSPHORUS: 4 mg/dL (ref 2.5–4.6)

## 2016-07-28 MED ORDER — IPRATROPIUM-ALBUTEROL 0.5-2.5 (3) MG/3ML IN SOLN
3.0000 mL | Freq: Two times a day (BID) | RESPIRATORY_TRACT | Status: DC
  Administered 2016-07-28 – 2016-07-29 (×2): 3 mL via RESPIRATORY_TRACT
  Filled 2016-07-28 (×2): qty 3

## 2016-07-28 NOTE — Progress Notes (Signed)
Advanced Heart Failure Rounding Note   Subjective:    Overall feeling ok. Denies SOB/CP .   RHC/LHC   Mid RCA to Dist RCA lesion, 0 %stenosed.  Mid RCA lesion, 20 %stenosed.  Dist RCA lesion, 30 %stenosed.  Dist Cx lesion, 60 %stenosed.  Prox LAD to Mid LAD lesion, 20 %stenosed.  The left ventricular ejection fraction is greater than 65% by visual estimate.  Findings: Done on dopamine 54mcg/kg/min Ao = 110/47 (72) LV = 120/10 RA = 5 RV = 47/7 PA = 42/16 (27) PCW = 6 Fick cardiac output/index = 5.7/3.4 PVR = 4.2 WU SVR 945 Ao sat =  88% PA sat = 71%, 69% High SVC sat = 69% Assessment: 1. Heavily calcified coronary arteries with only minimal luminal CAD. Widely patent RCA stent 2. LVEF 70% without regional wall motion abnormalities 3. Mild PAH with normal cardiac output on low-dose dopamine 4. No intracardiac shunt 4. PAD - central aortic pressure ~36mmHG greater than radial artery pressure or cuff pressure   Objective:   Weight Range:  Vital Signs:   Temp:  [97.5 F (36.4 C)-98.3 F (36.8 C)] 98.2 F (36.8 C) (06/15 0521) Pulse Rate:  [85-88] 85 (06/15 0521) Resp:  [16-18] 18 (06/15 0521) BP: (110-127)/(43-55) 127/43 (06/15 0521) SpO2:  [92 %-97 %] 92 % (06/15 0904) Weight:  [165 lb 1.6 oz (74.9 kg)] 165 lb 1.6 oz (74.9 kg) (06/15 0521) Last BM Date: 07/27/13  Weight change: Filed Weights   07/26/16 0500 07/27/16 0400 07/28/16 0521  Weight: 163 lb 2.3 oz (74 kg) 164 lb 7.4 oz (74.6 kg) 165 lb 1.6 oz (74.9 kg)    Intake/Output:   Intake/Output Summary (Last 24 hours) at 07/28/16 0954 Last data filed at 07/28/16 0416  Gross per 24 hour  Intake              260 ml  Output              200 ml  Net               60 ml     Physical Exam: General:  Well appearing. No resp difficulty. Sitting on the side of the bed. HEENT: normal Neck: supple. no JVD. Carotids 2+ bilat; no bruits. No lymphadenopathy or thryomegaly appreciated. Cor: PMI  nondisplaced. Regular rate & rhythm. No rubs, gallops or murmurs. Lungs: clear. On 4 liters oxygen  Abdomen: soft, nontender, nondistended. No hepatosplenomegaly. No bruits or masses. Good bowel sounds. Extremities: no cyanosis, clubbing, rash, R and LLE 1+ edema.  Neuro: alert & orientedx3, cranial nerves grossly intact. moves all 4 extremities w/o difficulty. Affect pleasant    Telemetry: NSR 80-90s . Personally reviewed.  Labs: Basic Metabolic Panel:  Recent Labs Lab 07/22/16 0415 07/23/16 0445 07/24/16 0426 07/26/16 0510 07/27/16 0443 07/28/16 0405  NA 138 139 137 133* 135 137  K 4.5 4.3 4.0 4.1 4.3 4.1  CL 97* 93* 87* 89* 91* 95*  CO2 33* 39* 42* 36* 38* 37*  GLUCOSE 137* 130* 177* 160* 83 96  BUN 47* 24* 19 16 15 15   CREATININE 1.21* 0.99 0.89 0.85 0.85 0.81  CALCIUM 8.2* 8.1* 8.2* 8.0* 8.6* 8.7*  MG 2.1 1.7 1.8  --  2.0 2.1  PHOS 4.3 3.0 2.9  --  3.6 4.0    Liver Function Tests: No results for input(s): AST, ALT, ALKPHOS, BILITOT, PROT, ALBUMIN in the last 168 hours. No results for input(s): LIPASE, AMYLASE in the last  168 hours. No results for input(s): AMMONIA in the last 168 hours.  CBC:  Recent Labs Lab 07/23/16 0445 07/24/16 0426 07/26/16 0510 07/27/16 0443 07/28/16 0405  WBC 13.3* 11.7* 9.1 9.0 8.0  HGB 16.5* 16.1* 16.0* 15.5* 15.1*  HCT 53.9* 52.2* 52.5* 50.6* 50.7*  MCV 94.9 94.4 93.8 94.2 94.8  PLT 199 162 133* 124* 116*    Cardiac Enzymes:  Recent Labs Lab 07/21/16 1057  TROPONINI 0.06*    BNP: BNP (last 3 results)  Recent Labs  07/20/16 2107  BNP 1,113.9*    ProBNP (last 3 results) No results for input(s): PROBNP in the last 8760 hours.    Other results:  Imaging: Dg Chest Port 1 View  Result Date: 07/27/2016 CLINICAL DATA:  Patient admitted 07/20/2016 with a 2 week history of bilateral lower extremity swelling. Shortness of breath. EXAM: PORTABLE CHEST 1 VIEW COMPARISON:  Single-view of the chest 07/26/2016 and  07/24/2016. FINDINGS: Right PICC is unchanged. Streaky bibasilar airspace opacities persist. Aeration in the right lung base is mildly improved. There is cardiomegaly and vascular congestion. Aortic atherosclerosis is noted. No pneumothorax. IMPRESSION: Cardiomegaly and vascular congestion. Streaky bibasilar airspace opacities are likely due to atelectasis and improved on the right. Atherosclerosis. Electronically Signed   By: Inge Rise M.D.   On: 07/27/2016 07:56   Mr Cardiac Morphology W Wo Contrast  Result Date: 07/27/2016 CLINICAL DATA:  Congestive Heart Failure EXAM: CARDIAC MRI TECHNIQUE: The patient was scanned on a 1.5 Tesla GE magnet. A dedicated cardiac coil was used. Functional imaging was done using Fiesta sequences. 2,3, and 4 chamber views were done to assess for RWMA's. Modified Simpson's rule using a short axis stack was used to calculate an ejection fraction on a dedicated work Conservation officer, nature. The patient received 20 cc of Multihance. After 10 minutes inversion recovery sequences were used to assess for infiltration and scar tissue. CONTRAST:  20 cc Multihance FINDINGS: There was mild RAE. There was moderate RVE with hypokinesis. Thee was no ASD/PFO or VSD. There was no pericardial effusion. The LA and LV were normal in size and function. The septum was only 33mm in thickness. Morphologically there was no evidence of HOCM or constriction. The quantitative EF was 77% (EDV 65 cc ESV 15 cc SV 50 cc) The aortic root was normal. The AV was tri leaflet and normal The MV was normal without significant MR. There was mild TR. Post gadolinium on delayed gadolinium images the myocardium could not be fully nulled. IMPRESSION: 1) No LVH Normal LV size and function EF 77% 2) Unable to fully null the LV myocardium on delayed inversion recovery sequence suggesting possibility of infiltrative DCM. However the morphology is not classic for amyloidosis 3) Moderate RV enlargement and  hypokinesis with no infarct or diverticula 4) Mild RA enlargement 5) Normal valves mild TR 6) No pericardial effusion [place] Electronically Signed   By: [place] M.D.   On: 07/27/2016 19:01     Medications:     Scheduled Medications: . aspirin EC  81 mg Oral Daily  . Chlorhexidine Gluconate Cloth  6 each Topical Daily  . insulin aspart  0-5 Units Subcutaneous QHS  . insulin aspart  0-9 Units Subcutaneous TID WC  . ipratropium-albuterol  3 mL Nebulization BID  . mouth rinse  15 mL Mouth Rinse BID  . nicotine  7 mg Transdermal Daily  . pravastatin  10 mg Oral q1800  . sodium chloride flush  10-40 mL Intracatheter Q12H  .  sodium chloride flush  3 mL Intravenous Q12H    Infusions: . sodium chloride Stopped (07/27/16 0600)  . sodium chloride    . DOPamine Stopped (07/26/16 1800)    PRN Medications: sodium chloride, sodium chloride, acetaminophen, ondansetron (ZOFRAN) IV, polyethylene glycol, sodium chloride flush, sodium chloride flush   Assessment/Plan/Discussion   1.  Acute Respiratory Failure -required short term bipap on admit.  Requiring 6 liters oxygen continuously. Will need home oxygen.   2. Shock ---> RV Failure. Hypotensive on admit. Improved with dopamine.  Wean to day.  3. Acute RV Failure- Noted on ECHO LVEF preserved but RV mod-severely reduced. . CTA negative for PE. RHC with mild PAH and normal cardiac output.  CMRI- LVEF 77% No RV infarct. Possible infiltrative process but not classic amyloid process. SPEP UPEP negative.  Volume overload on admit. Diuresed with IV lasix. Overall diuresed 14 pounds. Start lasix 40 mg po lasix today. dailly.  4. AKI- creatinine on admit 1.79. Likely elevated with hypotension. ---> creatinine stable at 0.85.    5. ?CAP- initially on rocephin/azithromycin. Antibiotics stopped.  6. CAD- LHC 2011s/p stenting of the mid RCA in Dec 2011 with a DES. Residual 70% stenosis in the mid LCx and 90% in the distal LCx. LHC calcified  coronary arteries minimal CAD and patent RCA stent.  Troponin on admit 0.06>0.06>0.06 On aspirin. She is intolerant to niacin and crestor.  Started pravastatin this admit.  7. Suspect OSA- Will need outpatient sleep study. PCCM setting up.   HF follow up set up.   Length of Stay: [place] NP-C  07/28/2016, 9:54 AM  Advanced Heart Failure Team Pager [phone] (M-F; [place])  Please contact [facility] for night-coverage after hours (4p -7a ) and weekends on [email or web address]  Patient seen and examined with Darrick Grinder, NP. We discussed all aspects of the encounter. I agree with the assessment and plan as stated above.   She looks good today. Can go home from our standpoint. Start lasix 40 daily.   Suspect major issue is RV failure due to OSA and hypoxic lung disease. However cMRI raises question of possible amyloidosis. SPEP negative. ?TTR amyloid. We will continue w/u as outpatient. She will need home O2 and outpatient sleep study.   Please note that invasive BP is about 20-25 points higher than what we get with a cuff.   Glori Bickers, MD  3:38 PM

## 2016-07-28 NOTE — Progress Notes (Signed)
PROGRESS NOTE    Deanna Johnston  QHU:[id number] DOB: [birth year] DOA: 07/20/2016 PCP: Kelton Pillar, MD [phone]   Brief Narrative:  Patient is 71 year old female history of COPD with ongoing tobacco abuse 40-50-pack-year at 2 packs per day, reports cutback to have a pack-a-day, coronary artery disease status post stents, hyperlipidemia presented to the ED on 07/20/2016 the progressive shortness of breath 2 weeks, increase bilateral lower extremity edema. Patient noted to have a BNP of 1113.9, WBC of 11.6, lactic acid of 1.45, hypotensive with systolic blood pressures in the 60s to the 90s with sats of 90 on 4 L nasal cannula. Patient initially placed empirically on IV antibiotics.PCCM admitted the patient. Patient noted to initially required BiPAP and then subsequently on high flow nasal cannula.   Assessment & Plan:   Principal Problem:   Acute respiratory failure with hypoxia and hypercapnia (HCC) Active Problems:   Acute on chronic heart failure (HCC)   Cardiogenic shock (HCC)   Tobacco dependence   Hyperlipidemia   Essential hypertension, benign   Pressure injury of skin   Hypotension   Right ventricular failure (HCC)   PAH (pulmonary artery hypertension) ([place])  #1 acute on chronic hypoxic/hypercarbic respiratory failure Consent for possible acute exacerbation of COPD and acute on chronic right ventricular heart failure. Patient was on the critical care service initially required BiPAP and subsequently weaned down to high flow nasal 6 L. Patient with clinical improvement. It was felt by critical care medicine the patient was likely hypoxemic for years and will likely need home O2 upon discharge. BiPAP has been discontinued. Patient is -10.8 L during this hospitalization. Patient status post antibiotics. Continue duo nebs. IV Lasix have been discontinued today and patient to be transitioned to oral Lasix per cardiology recommendations. Cardiology following and appreciate input and  recommendations.  #2 shock secondary to right ventricular failure likely secondary to cor pulmonale and obstructive sleep apnea 2-D echo 07/21/2016 with a EF of 65-70% with grade 1 diastolic dysfunction, moderate to severely reduced right ventricular systolic function. Dopamine has been discontinued. Patient status post right and left heart catheterization 07/26/2016 with EF of 65%, 60% distal circumflex lesion, 20% proximal LAD to mid LAD lesion, 30% distal RCA lesion, 20% stenosed mid RCA lesion. Patient subsequently underwent cardiac MRI. Is felt per pulmonary and cardiology that patient would likely need home O2 and outpatient sleep study. The pressure stable. Antihypertensive medications on hold due to borderline blood pressure.  #3 hyperlipidemia Continue statin.  #4 probable obstructive sleep apnea Patient will need outpatient sleep study. Pulmonary to set up.  #6 acute kidney injury Resolved.  #7 coronary artery disease Stable. Cardiac medications on hold due to borderline blood pressure shock on admission.   DVT prophylaxis: SCDs Code Status: Partial Family Communication: Updated patient. No family at bedside. Disposition Plan: Home once medically stable and per cardiology.   Consultants:   pccm admission Dr. Larkin Ina 07/21/2016  Cardiology: Dr. Haroldine Laws 07/25/2016  Cardiology: Dr.Fudim 07/21/2016  Procedures:   Right PICC line 07/23/2016   STUDIES:  CXR 6/7 > Mild cardiomegaly in vascular congestion. Heterogeneous bibasilar opacities may be atelectasis, PNA, or pulmonary edema  ECHO 6/8 > EF 65-70%, G1DD, mod to severely reduced RV systolic function. CTA chest 6/8 > No PE, RML atelectasis, Minimal effusions. Enlarged PA LE dupex 3/8 > No DVT RHC 6/13 > PAP 42/16 LHC 6/13 very calcified coronary arteries and patent RCA stent Cardiac MRI   Antimicrobials:  Azithromycin 6/7 > 6/8 Rocephin 6/7 >  6/12  CULTURES: Blood 6/7 > Sputum 6/8 >    Subjective: Patient laying in bed. Patient states shortness of breath improved since admission. Patient denies any chest pain. Patient currently on 3 L nasal cannula.  Objective: Vitals:   07/27/16 2009 07/28/16 0521 07/28/16 0904 07/28/16 1112  BP: (!) 113/48 (!) 127/43  (!) 109/43  Pulse: 88 85  84  Resp: 18 18  (!) 21  Temp: 97.5 F (36.4 C) 98.2 F (36.8 C)  98.5 F (36.9 C)  TempSrc: Oral Oral  Oral  SpO2: 95% 93% 92% 94%  Weight:  74.9 kg (165 lb 1.6 oz)    Height:        Intake/Output Summary (Last 24 hours) at 07/28/16 1143 Last data filed at 07/28/16 0416  Gross per 24 hour  Intake              260 ml  Output                0 ml  Net              260 ml   Filed Weights   07/26/16 0500 07/27/16 0400 07/28/16 0521  Weight: 74 kg (163 lb 2.3 oz) 74.6 kg (164 lb 7.4 oz) 74.9 kg (165 lb 1.6 oz)    Examination:  General exam: Appears calm and comfortable  Respiratory system: Some decreased breath sounds in the bases. No wheezing. Respiratory effort normal. Cardiovascular system: S1 & S2 heard, RRR. No JVD, murmurs, rubs, gallops or clicks. No pedal edema. Gastrointestinal system: Abdomen is nondistended, soft and nontender. No organomegaly or masses felt. Normal bowel sounds heard. Central nervous system: Alert and oriented. No focal neurological deficits. Extremities: Symmetric 5 x 5 power. Skin: Erythematous rash bilateral lower extremities. Psychiatry: Judgement and insight appear normal. Mood & affect appropriate.     Data Reviewed: I have personally reviewed following labs and imaging studies  CBC:  Recent Labs Lab 07/23/16 0445 07/24/16 0426 07/26/16 0510 07/27/16 0443 07/28/16 0405  WBC 13.3* 11.7* 9.1 9.0 8.0  HGB 16.5* 16.1* 16.0* 15.5* 15.1*  HCT 53.9* 52.2* 52.5* 50.6* 50.7*  MCV 94.9 94.4 93.8 94.2 94.8  PLT 199 162 133* 124* 161*   Basic Metabolic Panel:  Recent Labs Lab 07/22/16 0415 07/23/16 0445 07/24/16 0426 07/26/16 0510  07/27/16 0443 07/28/16 0405  NA 138 139 137 133* 135 137  K 4.5 4.3 4.0 4.1 4.3 4.1  CL 97* 93* 87* 89* 91* 95*  CO2 33* 39* 42* 36* 38* 37*  GLUCOSE 137* 130* 177* 160* 83 96  BUN 47* 24* 19 16 15 15   CREATININE 1.21* 0.99 0.89 0.85 0.85 0.81  CALCIUM 8.2* 8.1* 8.2* 8.0* 8.6* 8.7*  MG 2.1 1.7 1.8  --  2.0 2.1  PHOS 4.3 3.0 2.9  --  3.6 4.0   GFR: Estimated Creatinine Clearance: 53.2 mL/min (by C-G formula based on SCr of 0.81 mg/dL). Liver Function Tests: No results for input(s): AST, ALT, ALKPHOS, BILITOT, PROT, ALBUMIN in the last 168 hours. No results for input(s): LIPASE, AMYLASE in the last 168 hours. No results for input(s): AMMONIA in the last 168 hours. Coagulation Profile:  Recent Labs Lab 07/26/16 0510  INR 1.12   Cardiac Enzymes: No results for input(s): CKTOTAL, CKMB, CKMBINDEX, TROPONINI in the last 168 hours. BNP (last 3 results) No results for input(s): PROBNP in the last 8760 hours. HbA1C: No results for input(s): HGBA1C in the last 72 hours. CBG:  Recent Labs  Lab 07/27/16 0849 07/27/16 1138 07/27/16 1623 07/27/16 2008 07/28/16 0610  GLUCAP 93 144* 80 109* 76   Lipid Profile: No results for input(s): CHOL, HDL, LDLCALC, TRIG, CHOLHDL, LDLDIRECT in the last 72 hours. Thyroid Function Tests: No results for input(s): TSH, T4TOTAL, FREET4, T3FREE, THYROIDAB in the last 72 hours. Anemia Panel: No results for input(s): VITAMINB12, FOLATE, FERRITIN, TIBC, IRON, RETICCTPCT in the last 72 hours. Sepsis Labs:  Recent Labs Lab 07/22/16 0415  PROCALCITON <0.10    Recent Results (from the past 240 hour(s))  MRSA PCR Screening     Status: None   Collection Time: 07/21/16 12:37 AM  Result Value Ref Range Status   MRSA by PCR NEGATIVE NEGATIVE Final    Comment:        The GeneXpert MRSA Assay (FDA approved for NASAL specimens only), is one component of a comprehensive MRSA colonization surveillance program. It is not intended to diagnose  MRSA infection nor to guide or monitor treatment for MRSA infections.   Culture, Urine     Status: None   Collection Time: 07/21/16 12:50 AM  Result Value Ref Range Status   Specimen Description URINE, RANDOM  Final   Special Requests NONE  Final   Culture NO GROWTH  Final   Report Status 07/22/2016 FINAL  Final  Blood Culture (routine x 2)     Status: None   Collection Time: 07/21/16  1:01 AM  Result Value Ref Range Status   Specimen Description BLOOD RIGHT HAND  Final   Special Requests IN PEDIATRIC BOTTLE Blood Culture adequate volume  Final   Culture NO GROWTH 5 DAYS  Final   Report Status 07/26/2016 FINAL  Final  Blood Culture (routine x 2)     Status: None   Collection Time: 07/21/16  1:01 AM  Result Value Ref Range Status   Specimen Description BLOOD CENTRAL LINE  Final   Special Requests   Final    BOTTLES DRAWN AEROBIC AND ANAEROBIC Blood Culture adequate volume   Culture NO GROWTH 5 DAYS  Final   Report Status 07/26/2016 FINAL  Final         Radiology Studies: Dg Chest Port 1 View  Result Date: 07/27/2016 CLINICAL DATA:  Patient admitted 07/20/2016 with a 2 week history of bilateral lower extremity swelling. Shortness of breath. EXAM: PORTABLE CHEST 1 VIEW COMPARISON:  Single-view of the chest 07/26/2016 and 07/24/2016. FINDINGS: Right PICC is unchanged. Streaky bibasilar airspace opacities persist. Aeration in the right lung base is mildly improved. There is cardiomegaly and vascular congestion. Aortic atherosclerosis is noted. No pneumothorax. IMPRESSION: Cardiomegaly and vascular congestion. Streaky bibasilar airspace opacities are likely due to atelectasis and improved on the right. Atherosclerosis. Electronically Signed   By: Inge Rise M.D.   On: 07/27/2016 07:56   Mr Cardiac Morphology W Wo Contrast  Result Date: 07/27/2016 CLINICAL DATA:  Congestive Heart Failure EXAM: CARDIAC MRI TECHNIQUE: The patient was scanned on a 1.5 Tesla GE magnet. A dedicated  cardiac coil was used. Functional imaging was done using Fiesta sequences. 2,3, and 4 chamber views were done to assess for RWMA's. Modified Simpson's rule using a short axis stack was used to calculate an ejection fraction on a dedicated work Conservation officer, nature. The patient received 20 cc of Multihance. After 10 minutes inversion recovery sequences were used to assess for infiltration and scar tissue. CONTRAST:  20 cc Multihance FINDINGS: There was mild RAE. There was moderate RVE with hypokinesis. Thee was no ASD/PFO  or VSD. There was no pericardial effusion. The LA and LV were normal in size and function. The septum was only 82mm in thickness. Morphologically there was no evidence of HOCM or constriction. The quantitative EF was 77% (EDV 65 cc ESV 15 cc SV 50 cc) The aortic root was normal. The AV was tri leaflet and normal The MV was normal without significant MR. There was mild TR. Post gadolinium on delayed gadolinium images the myocardium could not be fully nulled. IMPRESSION: 1) No LVH Normal LV size and function EF 77% 2) Unable to fully null the LV myocardium on delayed inversion recovery sequence suggesting possibility of infiltrative DCM. However the morphology is not classic for amyloidosis 3) Moderate RV enlargement and hypokinesis with no infarct or diverticula 4) Mild RA enlargement 5) Normal valves mild TR 6) No pericardial effusion [place] Rouge Electronically Signed   By: Jenkins Rouge M.D.   On: 07/27/2016 19:01        Scheduled Meds: . aspirin EC  81 mg Oral Daily  . Chlorhexidine Gluconate Cloth  6 each Topical Daily  . insulin aspart  0-5 Units Subcutaneous QHS  . insulin aspart  0-9 Units Subcutaneous TID WC  . ipratropium-albuterol  3 mL Nebulization BID  . mouth rinse  15 mL Mouth Rinse BID  . nicotine  7 mg Transdermal Daily  . pravastatin  10 mg Oral q1800  . sodium chloride flush  10-40 mL Intracatheter Q12H  . sodium chloride flush  3 mL Intravenous Q12H    Continuous Infusions: . sodium chloride Stopped (07/27/16 0600)  . sodium chloride       LOS: 8 days    Time spent: 40 minutes    Nydia Ytuarte, MD [facility] Pager [phone]   If 7PM-7AM, please contact night-coverage [email or web address] Password [facility] 07/28/2016, 11:43 AM

## 2016-07-28 NOTE — Care Management Note (Addendum)
Case Management Note Marvetta Gibbons RN, BSN Unit 2W-Case Manager-- [place] coverage [phone]  Patient Details  Name: Deanna Johnston MRN: [id number] Date of Birth: [birth year]  Subjective/Objective:  Pt admitted with acute on chronic HF-- currently on lasix drip and IV dopamine                 Action/Plan: PTA pt lived at home- PCP- Kelton Pillar,  [place] to follow   Expected Discharge Date:                  Expected Discharge Plan:  [place]  In-House Referral:     Discharge planning Services  CM Consult  Post Acute Care Choice:  Durable Medical Equipment Choice offered to:  Patient  DME Arranged:  Oxygen DME Agency:     HH Arranged:    [place] Agency:     Status of Service:  In process, will continue to follow  If discussed at Long Length of Stay Meetings, dates discussed:    Discharge Disposition:   Additional Comments:  07/28/16- 1330- Marvetta Gibbons RN, CM- continue to follow for d/c needs- note that PCCM to arrange outpt sleep study- orders placed for home 02- pt will need qualifying note have spoken with bedside RN to be sure this gets done for home 02 arrangements- call made to Santiago Glad with [facility] for DME- home 02 needs- Per PT no recommendation for [facility] f/u- pt will need rollator for home- please place DME order for discharge- have spoken to pt at bedside- reviewed PT eval with pt and that there are not recommendations for rehab- pt will plan to return home-    07/26/16- 1040- [place] RN, CM- noted plans for cardiac cath today- remains on Dopamine wean as tolerated per cards- noted pt will most likely need home 02 at time of discharge- CM will follow for orders, will need qualifying ambulatory stats closer to discharge.   Dawayne Patricia, RN 07/28/2016, 1:37 PM

## 2016-07-28 NOTE — Care Management Important Message (Signed)
Important Message  Patient Details  Name: Deanna Johnston MRN: [id number] Date of Birth: August 15, 1945   Medicare Important Message Given:  Yes    Christi Wirick [name] 07/28/2016, 12:59 PM

## 2016-07-28 NOTE — Evaluation (Signed)
Physical Therapy Evaluation Patient Details Name: Deanna Johnston MRN: [id number] DOB: Sep 16, 1945 Today's Date: 07/28/2016   History of Present Illness  Patient is a 71 yo female admitted 07/20/16 with increasing dyspnea.  Patient with acute on chronic heart failure, respiratory failure, hypotension, AKI.   PMH of COPD (Active smoker, 40-50 year 2 ppd, reports cut back to 1/2 ppd), CAD s/p stents in 2011, HLD, MI. Reports at baseline is SOB with chronic cough  Clinical Impression  Patient is functioning at Mod I level with all mobility and gait.  Reports she is at her baseline.  Patient more stable with RW.  Recommend rollator (4-wheeled walker with seat) for home.    Follow Up Recommendations No PT follow up;Supervision - Intermittent    Equipment Recommendations  Rolling walker with 5" wheels (Rollator with 4 wheels and seat)    Recommendations for Other Services       Precautions / Restrictions Precautions Precautions: Fall Restrictions Weight Bearing Restrictions: No      Mobility  Bed Mobility Overal bed mobility: Modified Independent                Transfers Overall transfer level: Modified independent Equipment used: None;Rolling walker (2 wheeled)             General transfer comment: Increased time with transfers.  Ambulation/Gait Ambulation/Gait assistance: Modified independent (Device/Increase time);Supervision Ambulation Distance (Feet): 250 Feet Assistive device: None;Rolling walker (2 wheeled) Gait Pattern/deviations: Step-through pattern;Decreased step length - right;Decreased step length - left;Decreased stride length;Shuffle;Trunk flexed Gait velocity: Quick Gait velocity interpretation: at or above normal speed for age/gender General Gait Details: Patient initially ambulated with no assistive device.  Slightly unsteady.  Provided RW which improved balance and safety.  Ambulated on O2 at [employer]     Modified Rankin (Stroke Patients Only)       Balance Overall balance assessment: Needs assistance         Standing balance support: No upper extremity supported Standing balance-Leahy Scale: Good                               Pertinent Vitals/Pain Pain Assessment: No/denies pain    Home Living Family/patient expects to be discharged to:: Private residence Living Arrangements: Alone Available Help at Discharge: Family;Available PRN/intermittently Type of Home: House Home Access: Stairs to enter Entrance Stairs-Rails: Psychiatric nurse of Steps: 4 Home Layout: One level Home Equipment: Cane - single point;Bedside commode      Prior Function Level of Independence: Independent         Comments: Independent with gait and ADL's.  Patient drives.     Hand Dominance        Extremity/Trunk Assessment   Upper Extremity Assessment Upper Extremity Assessment: Overall WFL for tasks assessed    Lower Extremity Assessment Lower Extremity Assessment: Generalized weakness    Cervical / Trunk Assessment Cervical / Trunk Assessment: Kyphotic;Other exceptions Cervical / Trunk Exceptions: Spine "curved"  Communication   Communication: No difficulties  Cognition Arousal/Alertness: Awake/alert Behavior During Therapy: WFL for tasks assessed/performed Overall Cognitive Status: Within Functional Limits for tasks assessed                                        General Comments  Exercises     Assessment/Plan    PT Assessment Patent does not need any further PT services  PT Problem List         PT Treatment Interventions      PT Goals (Current goals can be found in the Care Plan section)  Acute Rehab PT Goals Patient Stated Goal: To go home PT Goal Formulation: All assessment and education complete, DC therapy    Frequency     Barriers to discharge        Co-evaluation               AM-PAC PT "6  Clicks" Daily Activity  Outcome Measure Difficulty turning over in bed (including adjusting bedclothes, sheets and blankets)?: None Difficulty moving from lying on back to sitting on the side of the bed? : None Difficulty sitting down on and standing up from a chair with arms (e.g., wheelchair, bedside commode, etc,.)?: None Help needed moving to and from a bed to chair (including a wheelchair)?: None Help needed walking in hospital room?: None Help needed climbing 3-5 steps with a railing? : A Little 6 Click Score: 23    End of Session Equipment Utilized During Treatment: Gait belt;Oxygen Activity Tolerance: Patient tolerated treatment well Patient left: in bed;with call bell/phone within reach (sitting EOB) Nurse Communication: Mobility status PT Visit Diagnosis: Unsteadiness on feet (R26.81);Muscle weakness (generalized) (M62.81)    Time: 4193-7902 PT Time Calculation (min) (ACUTE ONLY): 29 min   Charges:   PT Evaluation $PT Eval Moderate Complexity: 1 Procedure PT Treatments $Gait Training: 8-22 mins   PT G Codes:        Carita Pian. Sanjuana Kava, [facility] Acute Rehab Services Pager [place] 07/28/2016, 1:47 PM

## 2016-07-28 NOTE — Progress Notes (Signed)
SATURATION QUALIFICATIONS: (This note is used to comply with regulatory documentation for home oxygen)  Patient Saturations on Room Air at Rest = 88%  Patient Saturations on Room Air while Ambulating =   Patient Saturations on 2 Liters of oxygen while Ambulating = 96%  Please briefly explain why patient needs home oxygen: patient needs home oxygen to maintain adequate perfusion.

## 2016-07-28 NOTE — Care Management Important Message (Signed)
Important Message  Patient Details  Name: Deanna Johnston MRN: [id number] Date of Birth: 12-30-1945   Medicare Important Message Given:  Yes    [name] [name] 07/28/2016, 12:59 PM

## 2016-07-29 DIAGNOSIS — I50810 Right heart failure, unspecified: Secondary | ICD-10-CM

## 2016-07-29 DIAGNOSIS — I50811 Acute right heart failure: Secondary | ICD-10-CM

## 2016-07-29 DIAGNOSIS — F17200 Nicotine dependence, unspecified, uncomplicated: Secondary | ICD-10-CM

## 2016-07-29 LAB — BASIC METABOLIC PANEL
ANION GAP: 6 (ref 5–15)
BUN: 14 mg/dL (ref 6–20)
CALCIUM: 8.6 mg/dL — AB (ref 8.9–10.3)
CO2: 33 mmol/L — ABNORMAL HIGH (ref 22–32)
Chloride: 98 mmol/L — ABNORMAL LOW (ref 101–111)
Creatinine, Ser: 0.75 mg/dL (ref 0.44–1.00)
GLUCOSE: 111 mg/dL — AB (ref 65–99)
POTASSIUM: 4.1 mmol/L (ref 3.5–5.1)
Sodium: 137 mmol/L (ref 135–145)

## 2016-07-29 LAB — CBC WITH DIFFERENTIAL/PLATELET
BASOS ABS: 0.1 10*3/uL (ref 0.0–0.1)
BASOS PCT: 1 %
EOS PCT: 5 %
Eosinophils Absolute: 0.4 10*3/uL (ref 0.0–0.7)
HEMATOCRIT: 50 % — AB (ref 36.0–46.0)
Hemoglobin: 15 g/dL (ref 12.0–15.0)
Lymphocytes Relative: 18 %
Lymphs Abs: 1.4 10*3/uL (ref 0.7–4.0)
MCH: 28.5 pg (ref 26.0–34.0)
MCHC: 30 g/dL (ref 30.0–36.0)
MCV: 95.1 fL (ref 78.0–100.0)
MONO ABS: 0.6 10*3/uL (ref 0.1–1.0)
Monocytes Relative: 8 %
NEUTROS ABS: 5.3 10*3/uL (ref 1.7–7.7)
Neutrophils Relative %: 68 %
PLATELETS: 114 10*3/uL — AB (ref 150–400)
RBC: 5.26 MIL/uL — AB (ref 3.87–5.11)
RDW: 15.1 % (ref 11.5–15.5)
WBC: 7.8 10*3/uL (ref 4.0–10.5)

## 2016-07-29 LAB — GLUCOSE, CAPILLARY
Glucose-Capillary: 104 mg/dL — ABNORMAL HIGH (ref 65–99)
Glucose-Capillary: 166 mg/dL — ABNORMAL HIGH (ref 65–99)

## 2016-07-29 MED ORDER — FUROSEMIDE 40 MG PO TABS: 40.0000 mg | ORAL_TABLET | Freq: Every day | ORAL | 1 refills | Status: DC

## 2016-07-29 MED ORDER — FUROSEMIDE 40 MG PO TABS
40.0000 mg | ORAL_TABLET | Freq: Every day | ORAL | Status: DC
  Administered 2016-07-29: 40 mg via ORAL
  Filled 2016-07-29: qty 1

## 2016-07-29 MED ORDER — IPRATROPIUM-ALBUTEROL 0.5-2.5 (3) MG/3ML IN SOLN: 3.0000 mL | Freq: Two times a day (BID) | RESPIRATORY_TRACT | 0 refills | Status: DC

## 2016-07-29 MED ORDER — PRAVASTATIN SODIUM 10 MG PO TABS: 10.0000 mg | ORAL_TABLET | Freq: Every day | ORAL | 1 refills | Status: DC

## 2016-07-29 MED ORDER — POTASSIUM CHLORIDE CRYS ER 20 MEQ PO TBCR: 20.0000 meq | EXTENDED_RELEASE_TABLET | Freq: Every day | ORAL | 0 refills | Status: DC

## 2016-07-29 MED ORDER — NICOTINE 7 MG/24HR TD PT24: 7.0000 mg | MEDICATED_PATCH | Freq: Every day | TRANSDERMAL | 0 refills | Status: DC

## 2016-07-29 NOTE — Progress Notes (Signed)
Patient in a stable condition, discharge education reviewed with patient and family at bedside, they verbalized understanding, tele dc home oxygen at bedside, personal belongings at bedside, tele dc ccmd notified.

## 2016-07-29 NOTE — Progress Notes (Signed)
CM received call from MD to speak with pt and family concerning home needs.  CM spoke with pt and niece of pt who inquire about a personal care insurance policy - similar to CAPs (which they have gotten a preauth number good til Wednesday) and would we arrange for services.  CM explained we set up for "skilled home health services" and no HH services have been recommended nor ordered BUT she can call the pt's PCP, Kelton Pillar (as it is the PCPs office which helps with CAPs documentation requirements/needs) on Monday 07/31/16.  CM notified [facility] rep, Jermaine for home O2 setup and delivery of transport tank and rollator to room.  No other CM needs were communicated.

## 2016-07-29 NOTE — Progress Notes (Addendum)
SATURATION QUALIFICATIONS: (This note is used to comply with regulatory documentation for home oxygen)  Patient Saturations on Room Air at Rest = 85%  Patient Saturations on Room Air while Ambulating   Patient Saturations on 3 Liters of oxygen while Ambulating = 93 %  Please briefly explain why patient needs home oxygen: patient needs home oxygen to maintain adequate perfusion. Alternate treatments tried and found insufficient.Patient on lasix

## 2016-07-29 NOTE — Discharge Summary (Signed)
Physician Discharge Summary  Deanna Johnston MOQ:[id number] DOB: Jul 11, 1945 DOA: 07/20/2016  PCP: Kelton Pillar, MD  Admit date: 07/20/2016 Discharge date: 07/29/2016  Time spent: 60 minutes  Recommendations for Outpatient Follow-up:  1. Follow-up with Dr. Haroldine Laws, cardiology 08/08/2016. On follow-up patient will need a basic metabolic profile done to follow-up on electrolytes and renal function. Patient's blood pressure and cardiac medications need to be reassessed at that time. 2. Follow-up with Dr. Vaughan Browner, pulmonary in 2 weeks. Patient need to be set up for sleep study as well as further management of her COPD. 3. Follow-up with Kelton Pillar, MD in 2 weeks. 4.    Discharge Diagnoses:  Principal Problem:   Acute respiratory failure with hypoxia and hypercapnia (HCC) Active Problems:   Acute on chronic heart failure (HCC)   Cardiogenic shock (HCC)   Tobacco dependence   Hyperlipidemia   Essential hypertension, benign   Pressure injury of skin   Hypotension   Right ventricular failure (HCC)   PAH (pulmonary artery hypertension) ([place])   Hypoxia   Discharge Condition: Stable and improved  Diet recommendation: Heart healthy  Filed Weights   07/27/16 0400 07/28/16 0521 07/29/16 0323  Weight: 74.6 kg (164 lb 7.4 oz) 74.9 kg (165 lb 1.6 oz) 74.7 kg (164 lb 9.6 oz)    History of present illness:  Per Dr. Corrie Dandy 71 year old female with PMH of COPD (Active smoker, 40-50 year 2 ppd, reports cut back to 1/2 ppd), CAD s/p stents in 2011, HLD, MI. Reports at baseline is SOB with chronic cough.   Presents to ED on 6/7 with progressive dyspnea for the last 2 weeks with increased swelling to BLE. Upon arrival to ED BNP 1113.9, WBC 11.6, Lactic 1.45, ABG 7.3/60/63. Hypotensive with systolic 35-46, oxygenation 90s on 4L [place], received 500 ml bolus and Rocephin/Azithromycin. Cardiology was consulted who recommended diuresis. PCCM asked to admit.   Hospital Course:  #1 acute on chronic  hypoxic/hypercarbic respiratory failure Concern for possible acute exacerbation of COPD and acute on chronic right ventricular heart failure. Patient was on the critical care service initially required BiPAP and subsequently weaned down to high flow nasal 6 L. Patient with clinical improvement. It was felt by critical care medicine the patient was likely hypoxemic for years and will likely need home O2 upon discharge. BiPAP was subsequently discontinued. Patient will was also diuresed with IV diuretics and transitioned to oral Lasix per cardiology. Patient was -10.8 L during this hospitalization. Patient status post antibiotics. Patient also placed on DuoNeb's per pulmonary recommendations and will be discharged on DuoNeb's. Patient improved clinically. Oxygen on room air and ambulation with checked and patient was noted to have sats of 85% on room air. Patient be discharged home on oxygen 3 L nasal cannula. Patient is to follow-up with pulmonary and cardiology in the outpatient setting.  #2 shock secondary to right ventricular failure likely secondary to cor pulmonale and obstructive sleep apnea 2-D echo 07/21/2016 with a EF of 65-70% with grade 1 diastolic dysfunction, moderate to severely reduced right ventricular systolic function. Dopamine which was initially started due to shock was subsequently discontinued as patient's blood pressure improved. Patient status post right and left heart catheterization 07/26/2016 with EF of 65%, 60% distal circumflex lesion, 20% proximal LAD to mid LAD lesion, 30% distal RCA lesion, 20% stenosed mid RCA lesion. Patient subsequently underwent cardiac MRI. Is felt per pulmonary and cardiology that patient would likely need home O2 and outpatient sleep study. Patient's blood pressure  stabilized after dopamine was discontinued. Patient's antihypertensive medications were held due to borderline blood pressure was not resumed on discharge. Patient was discharged on oral Lasix 40  mg daily and is to follow-up with cardiology and pulmonary outpatient setting. Patient will be set up by pulmonary for sleep study.   #3 hyperlipidemia Patient was placed on a statin during the hospitalization be discharged on a statin.  #4 probable obstructive sleep apnea Patient will need outpatient sleep study. Pulmonary to set up as an outpatient.  #6 acute kidney injury Resolved.  #7 coronary artery disease Stable. Cardiac medications of metoprolol and ARB were held due to borderline blood pressure and not resumed on discharge. Patient will follow-up with cardiology in the outpatient setting.    Procedures:  Right PICC line 07/23/2016>>>07/29/2016  Consultations:  pccm admission Dr. Larkin Ina 07/21/2016  Cardiology: Dr. Haroldine Laws 07/25/2016  Cardiology: Dr.Fudim 07/21/2016  Discharge Exam: Vitals:   07/29/16 0323 07/29/16 0848  BP: (!) 139/48 (!) 121/49  Pulse: 92 (!) 101  Resp: 18 (!) 21  Temp: 98.2 F (36.8 C) 97.8 F (36.6 C)    General: NAD Cardiovascular: RRR Respiratory: CTAB  Discharge Instructions   Discharge Instructions    Diet - low sodium heart healthy    Complete by:  As directed    Increase activity slowly    Complete by:  As directed      Current Discharge Medication List    START taking these medications   Details  furosemide (LASIX) 40 MG tablet Take 1 tablet (40 mg total) by mouth daily. Qty: 30 tablet, Refills: 1    ipratropium-albuterol (DUONEB) 0.5-2.5 (3) MG/3ML SOLN Take 3 mLs by nebulization 2 (two) times daily. Qty: 360 mL, Refills: 0    nicotine (NICODERM CQ - DOSED IN MG/24 HR) 7 mg/24hr patch Place 1 patch (7 mg total) onto the skin daily. Qty: 28 patch, Refills: 0    potassium chloride SA (K-DUR,KLOR-CON) 20 MEQ tablet Take 1 tablet (20 mEq total) by mouth daily. Qty: 30 tablet, Refills: 0    pravastatin (PRAVACHOL) 10 MG tablet Take 1 tablet (10 mg total) by mouth daily at 6 PM. Qty: 30 tablet, Refills: 1       CONTINUE these medications which have NOT CHANGED   Details  aspirin EC 81 MG tablet Take 81 mg by mouth daily.    nitroGLYCERIN (NITROSTAT) 0.4 MG SL tablet PLACE 1 TABLET UNDER THE TONGUE EVERY 5 MINUTES AS NEEDED FOR CHEST PAIN Qty: 75 tablet, Refills: 0      STOP taking these medications     metoprolol tartrate (LOPRESSOR) 25 MG tablet      valsartan (DIOVAN) 80 MG tablet        Allergies  Allergen Reactions  . Lipitor [Atorvastatin]     myalgias  . Crestor [Rosuvastatin Calcium]     intolerant  . Prednisone     "just makes me sick"  . Sulfa Antibiotics Swelling  . Thyroid Hormones Other (See Comments)    Paranoid   . Niaspan [Niacin] Nausea Only    nausea   Follow-up Information    Bensimhon, Shaune Pascal, MD Follow up on 08/08/2016.   Specialty:  Cardiology Why:  at 3:00 [place] information: [place] [address] [place] Follow up.   Why:  [place] Contact information: [place]  [address] [place] [address] [phone]        Kelton Pillar, MD. Schedule an appointment as soon as possible for a visit in 2 week(s).   Specialty:  Family Medicine Contact information: [address] [place] [address] [phone]        Marshell Garfinkel, MD. Schedule an appointment as soon as possible for a visit in 2 week(s).   Specialty:  Pulmonary Disease Contact information: [address] [place] [address] [phone]            The results of significant diagnostics from this hospitalization (including imaging, microbiology, ancillary and laboratory) are listed below for reference.    Significant Diagnostic Studies: Ct Angio Chest Pe W Or Wo Contrast  Result Date: 07/21/2016 CLINICAL DATA:  Hypoxia, elevated D-dimer level. EXAM: CT ANGIOGRAPHY CHEST WITH CONTRAST TECHNIQUE:  Multidetector CT imaging of the chest was performed using the standard protocol during bolus administration of intravenous contrast. Multiplanar CT image reconstructions and MIPs were obtained to evaluate the vascular anatomy. CONTRAST:  100 mL of Isovue 370 intravenously. COMPARISON:  CT scan of February 04, 2010. FINDINGS: Cardiovascular: Satisfactory opacification of the pulmonary arteries to the segmental level. No evidence of pulmonary embolism. Normal heart size. No pericardial effusion. Atherosclerosis of thoracic aorta is noted without aneurysm formation. Enlargement of pulmonary arteries is noted suggesting pulmonary artery hypertension. Coronary artery calcifications are noted. Mediastinum/Nodes: No enlarged mediastinal, hilar, or axillary lymph nodes. Thyroid gland, trachea, and esophagus demonstrate no significant findings. Lungs/Pleura: Minimal bilateral pleural effusions are noted with adjacent subsegmental atelectasis. No pneumothorax is noted. Right middle lobe atelectasis or infiltrate is noted. Upper Abdomen: No acute abnormality. Musculoskeletal: No chest wall abnormality. No acute or significant osseous findings. Multilevel degenerative disc disease is noted in the thoracic spine. Review of the MIP images confirms the above findings. IMPRESSION: No definite evidence of pulmonary embolus. Aortic atherosclerosis. Coronary artery calcifications are noted suggesting coronary artery disease. Enlargement of pulmonary arteries are noted suggesting pulmonary artery hypertension. Minimal bilateral pleural effusions are noted with adjacent subsegmental atelectasis. Right middle lobe atelectasis or pneumonia is noted as well. Electronically Signed   By: Marijo Conception, M.D.   On: 07/21/2016 12:35   Dg Chest Port 1 View  Result Date: 07/27/2016 CLINICAL DATA:  Patient admitted 07/20/2016 with a 2 week history of bilateral lower extremity swelling. Shortness of breath. EXAM: PORTABLE CHEST 1 VIEW  COMPARISON:  Single-view of the chest 07/26/2016 and 07/24/2016. FINDINGS: Right PICC is unchanged. Streaky bibasilar airspace opacities persist. Aeration in the right lung base is mildly improved. There is cardiomegaly and vascular congestion. Aortic atherosclerosis is noted. No pneumothorax. IMPRESSION: Cardiomegaly and vascular congestion. Streaky bibasilar airspace opacities are likely due to atelectasis and improved on the right. Atherosclerosis. Electronically Signed   By: Inge Rise M.D.   On: 07/27/2016 07:56   Dg Chest Port 1 View  Result Date: 07/26/2016 CLINICAL DATA:  Shortness of breath, respiratory failure. History of CHF, previous MI, COPD, current smoker. EXAM: PORTABLE CHEST 1 VIEW COMPARISON:  Portable chest x-ray of June eleventh 2018 FINDINGS: The lungs are adequately inflated. The interstitial markings remain increased. The hemidiaphragms are better demonstrated. Coarse lung markings at both bases are slightly less conspicuous. The cardiac silhouette is enlarged. The central pulmonary vascularity remains engorged. There is calcification in the wall of the aortic arch. There is no significant pleural effusion. IMPRESSION: CHF superimposed upon COPD. Probable bibasilar atelectasis or interstitial pneumonia. Overall  there has been mild interval improvement in the appearance of the pulmonary interstitium since yesterday's study. Electronically Signed   By: David  Martinique M.D.   On: 07/26/2016 08:05   Dg Chest Port 1 View  Result Date: 07/24/2016 CLINICAL DATA:  71 year old female with shortness of breath. Subsequent encounter. EXAM: PORTABLE CHEST 1 VIEW COMPARISON:  07/23/2016 chest x-ray and 07/21/2016 chest CT. FINDINGS: Right PICC line tip distal superior vena cava level. Cardiomegaly. Pulmonary edema similar to prior exam. Left base subsegmental atelectasis. Calcified aorta. Prominence of pulmonary artery suggestive of pulmonary arterial hypertension. IMPRESSION: Right PICC line  has been place with the tip at the level of the distal superior vena cava. Cardiomegaly. Pulmonary edema and left base subsegmental atelectasis similar to prior exam. Aortic atherosclerosis. Electronically Signed   By: Genia Del M.D.   On: 07/24/2016 07:05   Dg Chest Port 1 View  Result Date: 07/23/2016 CLINICAL DATA:  Respiratory failure EXAM: PORTABLE CHEST 1 VIEW COMPARISON:  July 22, 2016 FINDINGS: There remains interstitial edema with patchy alveolar consolidation in the bases. There are small bilateral pleural effusions. There is atelectatic change in the medial left base, stable. There is cardiomegaly with pulmonary venous hypertension. There is aortic atherosclerosis. No adenopathy evident. No bone lesions. IMPRESSION: Persistent changes of congestive heart failure, not appreciably changed. No new opacity evident. There is aortic atherosclerosis. Electronically Signed   By: Lowella Grip III M.D.   On: 07/23/2016 07:31   Dg Chest Port 1 View  Result Date: 07/22/2016 CLINICAL DATA:  71 year old female with respiratory failure, congestive heart failure. Two weeks of progressive shortness of breath and lower extremity swelling. Undergoing diuresis. EXAM: PORTABLE CHEST 1 VIEW COMPARISON:  Chest CTA 07/21/2016 and earlier FINDINGS: Portable AP semi upright view at at 0458 hours. Chest CTA yesterday demonstrated dependent opacity and small bilateral pleural effusions. Pulmonary vascular congestion appears mildly increased compared to 07/20/2016. Bibasilar opacity is stable since yesterday. No pneumothorax. Stable cardiomegaly and mediastinal contours. Calcified aortic atherosclerosis. Visualized tracheal air column is within normal limits. IMPRESSION: 1. Pulmonary vascular congestion appears mildly worsened since 07/20/2016. 2. Stable patchy bibasilar opacity since yesterday favored to be atelectasis. Electronically Signed   By: Genevie Ann M.D.   On: 07/22/2016 07:33   Dg Chest Port 1 View  Result  Date: 07/21/2016 CLINICAL DATA:  Shortness of breath. EXAM: PORTABLE CHEST 1 VIEW COMPARISON:  07/20/2016 . FINDINGS: Cardiomegaly. Diffuse bilateral from interstitial prominence with bilateral pleural effusions. Findings consistent with CHF. Bibasilar pneumonia cannot be excluded. No pneumothorax P IMPRESSION: Congestive heart failure bibasilar pulmonary interstitial edema and small pleural effusions. Electronically Signed   By: Marcello Moores  Register   On: 07/21/2016 07:28   Dg Chest Portable 1 View  Result Date: 07/20/2016 CLINICAL DATA:  Shortness of breath.  Lower extremity edema. EXAM: PORTABLE CHEST 1 VIEW COMPARISON:  03/11/2013 FINDINGS: Mild cardiomegaly. Atherosclerosis of the thoracic aorta. Heterogeneous bibasilar opacities are increased from prior exam. Mild vascular congestion. Possible blunting of the costophrenic angles. No pneumothorax. The bones appear under mineralized. IMPRESSION: Mild cardiomegaly in vascular congestion. Heterogeneous bibasilar opacities may be atelectasis, pneumonia or pulmonary edema. Thoracic aortic atherosclerosis. Electronically Signed   By: Jeb Levering M.D.   On: 07/20/2016 21:42   Mr Cardiac Morphology W Wo Contrast  Result Date: 07/27/2016 CLINICAL DATA:  Congestive Heart Failure EXAM: CARDIAC MRI TECHNIQUE: The patient was scanned on a 1.5 Tesla GE magnet. A dedicated cardiac coil was used. Functional imaging was done using Fiesta sequences.  2,3, and 4 chamber views were done to assess for RWMA's. Modified Simpson's rule using a short axis stack was used to calculate an ejection fraction on a dedicated work Conservation officer, nature. The patient received 20 cc of Multihance. After 10 minutes inversion recovery sequences were used to assess for infiltration and scar tissue. CONTRAST:  20 cc Multihance FINDINGS: There was mild RAE. There was moderate RVE with hypokinesis. Thee was no ASD/PFO or VSD. There was no pericardial effusion. The LA and LV were normal in  size and function. The septum was only 4mm in thickness. Morphologically there was no evidence of HOCM or constriction. The quantitative EF was 77% (EDV 65 cc ESV 15 cc SV 50 cc) The aortic root was normal. The AV was tri leaflet and normal The MV was normal without significant MR. There was mild TR. Post gadolinium on delayed gadolinium images the myocardium could not be fully nulled. IMPRESSION: 1) No LVH Normal LV size and function EF 77% 2) Unable to fully null the LV myocardium on delayed inversion recovery sequence suggesting possibility of infiltrative DCM. However the morphology is not classic for amyloidosis 3) Moderate RV enlargement and hypokinesis with no infarct or diverticula 4) Mild RA enlargement 5) Normal valves mild TR 6) No pericardial effusion [place] Rouge Electronically Signed   By: Jenkins Rouge M.D.   On: 07/27/2016 19:01    Microbiology: Recent Results (from the past 240 hour(s))  MRSA PCR Screening     Status: None   Collection Time: 07/21/16 12:37 AM  Result Value Ref Range Status   MRSA by PCR NEGATIVE NEGATIVE Final    Comment:        The GeneXpert MRSA Assay (FDA approved for NASAL specimens only), is one component of a comprehensive MRSA colonization surveillance program. It is not intended to diagnose MRSA infection nor to guide or monitor treatment for MRSA infections.   Culture, Urine     Status: None   Collection Time: 07/21/16 12:50 AM  Result Value Ref Range Status   Specimen Description URINE, RANDOM  Final   Special Requests NONE  Final   Culture NO GROWTH  Final   Report Status 07/22/2016 FINAL  Final  Blood Culture (routine x 2)     Status: None   Collection Time: 07/21/16  1:01 AM  Result Value Ref Range Status   Specimen Description BLOOD RIGHT HAND  Final   Special Requests IN PEDIATRIC BOTTLE Blood Culture adequate volume  Final   Culture NO GROWTH 5 DAYS  Final   Report Status 07/26/2016 FINAL  Final  Blood Culture (routine x 2)      Status: None   Collection Time: 07/21/16  1:01 AM  Result Value Ref Range Status   Specimen Description BLOOD CENTRAL LINE  Final   Special Requests   Final    BOTTLES DRAWN AEROBIC AND ANAEROBIC Blood Culture adequate volume   Culture NO GROWTH 5 DAYS  Final   Report Status 07/26/2016 FINAL  Final     Labs: Basic Metabolic Panel:  Recent Labs Lab 07/23/16 0445 07/24/16 0426 07/26/16 0510 07/27/16 0443 07/28/16 0405 07/29/16 0321  NA 139 137 133* 135 137 137  K 4.3 4.0 4.1 4.3 4.1 4.1  CL 93* 87* 89* 91* 95* 98*  CO2 39* 42* 36* 38* 37* 33*  GLUCOSE 130* 177* 160* 83 96 111*  BUN 24* 19 16 15 15 14   CREATININE 0.99 0.89 0.85 0.85 0.81 0.75  CALCIUM 8.1*  8.2* 8.0* 8.6* 8.7* 8.6*  MG 1.7 1.8  --  2.0 2.1  --   PHOS 3.0 2.9  --  3.6 4.0  --    Liver Function Tests: No results for input(s): AST, ALT, ALKPHOS, BILITOT, PROT, ALBUMIN in the last 168 hours. No results for input(s): LIPASE, AMYLASE in the last 168 hours. No results for input(s): AMMONIA in the last 168 hours. CBC:  Recent Labs Lab 07/24/16 0426 07/26/16 0510 07/27/16 0443 07/28/16 0405 07/29/16 0321  WBC 11.7* 9.1 9.0 8.0 7.8  NEUTROABS  --   --   --   --  5.3  HGB 16.1* 16.0* 15.5* 15.1* 15.0  HCT 52.2* 52.5* 50.6* 50.7* 50.0*  MCV 94.4 93.8 94.2 94.8 95.1  PLT 162 133* 124* 116* 114*   Cardiac Enzymes: No results for input(s): CKTOTAL, CKMB, CKMBINDEX, TROPONINI in the last 168 hours. BNP: BNP (last 3 results)  Recent Labs  07/20/16 2107  BNP 1,113.9*    ProBNP (last 3 results) No results for input(s): PROBNP in the last 8760 hours.  CBG:  Recent Labs Lab 07/28/16 1139 07/28/16 1649 07/28/16 2257 07/29/16 0621 07/29/16 1157  GLUCAP 118* 112* 129* 104* 166*       Signed:  Babak Lucus MD.  [facility] 07/29/2016, 12:49 PM

## 2016-07-29 NOTE — Progress Notes (Signed)
Pt walked this pm up and down halls, about 700 ft. Tolerated well. Used RW and on 2L O2. Back to bed after walk. Will continue to monitor.  Jaymes Graff, RN

## 2016-08-01 ENCOUNTER — Telehealth: Payer: Self-pay | Admitting: Internal Medicine

## 2016-08-01 NOTE — Telephone Encounter (Signed)
Attempted to call the pt but the line is busy.  I spoke with Deanna Johnston and she stated that since the pt was seen in the hospital by pulmonary that she is not considered a new pt and we can offer an appt with TP in [place] since all of the other scheduled are booked.

## 2016-08-01 NOTE — Telephone Encounter (Signed)
Scheduled first available, which was with MW on 08/01, and advised patient I would send note to see if we can get her in with physician in 2 weeks, nothing avail on schedule with any provider.

## 2016-08-02 ENCOUNTER — Telehealth (HOSPITAL_COMMUNITY): Payer: Self-pay | Admitting: *Deleted

## 2016-08-02 NOTE — Telephone Encounter (Signed)
Spoke with in regards to scheduling at [facility] office. Pt states she will call us back, as she is unsure if she can get transportation to that office.  Will await call back

## 2016-08-02 NOTE — Telephone Encounter (Signed)
Pt called stating she has a rash on her back and it is very itchy, she feels it is from the Pravastatin as she has had trouble w/statins in the past.  She would like to stop this mediation and see if it improves.  Advised ok to stop for a few days and see, advised her to call us back and let us know if it improves or not, she is agreeable

## 2016-08-04 NOTE — Telephone Encounter (Signed)
Patient called back and was scheduled to see TP in [facility] on 08/17/2016, appt for 08/01 was cancelled and she will follow up as TP advises.  No call back is needed.

## 2016-08-04 NOTE — Telephone Encounter (Signed)
Noted by triage, thank you. Will sign off. 

## 2016-08-08 ENCOUNTER — Encounter (HOSPITAL_COMMUNITY): Payer: Self-pay | Admitting: Internal Medicine

## 2016-08-08 ENCOUNTER — Ambulatory Visit (HOSPITAL_COMMUNITY)
Admission: RE | Admit: 2016-08-08 | Discharge: 2016-08-08 | Disposition: A | Discharge status: Home / Self Care | Payer: Medicare Other | Source: Ambulatory Visit | Attending: Internal Medicine | Admitting: Internal Medicine

## 2016-08-08 VITALS — BP 120/54 | HR 64 | Wt 162.4 lb

## 2016-08-08 DIAGNOSIS — E669 Obesity, unspecified: Secondary | ICD-10-CM | POA: Insufficient documentation

## 2016-08-08 DIAGNOSIS — I252 Old myocardial infarction: Secondary | ICD-10-CM | POA: Diagnosis not present

## 2016-08-08 DIAGNOSIS — E785 Hyperlipidemia, unspecified: Secondary | ICD-10-CM | POA: Diagnosis not present

## 2016-08-08 DIAGNOSIS — M71022 Abscess of bursa, left elbow: Secondary | ICD-10-CM | POA: Diagnosis not present

## 2016-08-08 DIAGNOSIS — I50810 Right heart failure, unspecified: Secondary | ICD-10-CM | POA: Insufficient documentation

## 2016-08-08 DIAGNOSIS — Z87891 Personal history of nicotine dependence: Secondary | ICD-10-CM | POA: Diagnosis not present

## 2016-08-08 DIAGNOSIS — I959 Hypotension, unspecified: Secondary | ICD-10-CM | POA: Insufficient documentation

## 2016-08-08 DIAGNOSIS — M7022 Olecranon bursitis, left elbow: Secondary | ICD-10-CM | POA: Diagnosis not present

## 2016-08-08 DIAGNOSIS — Z7982 Long term (current) use of aspirin: Secondary | ICD-10-CM | POA: Diagnosis not present

## 2016-08-08 DIAGNOSIS — J449 Chronic obstructive pulmonary disease, unspecified: Secondary | ICD-10-CM | POA: Insufficient documentation

## 2016-08-08 DIAGNOSIS — Z79899 Other long term (current) drug therapy: Secondary | ICD-10-CM | POA: Diagnosis not present

## 2016-08-08 DIAGNOSIS — I2510 Atherosclerotic heart disease of native coronary artery without angina pectoris: Secondary | ICD-10-CM | POA: Insufficient documentation

## 2016-08-08 DIAGNOSIS — R0902 Hypoxemia: Secondary | ICD-10-CM

## 2016-08-08 DIAGNOSIS — J9611 Chronic respiratory failure with hypoxia: Secondary | ICD-10-CM | POA: Diagnosis not present

## 2016-08-08 LAB — BASIC METABOLIC PANEL
Anion gap: 7 (ref 5–15)
BUN: 13 mg/dL (ref 6–20)
CHLORIDE: 96 mmol/L — AB (ref 101–111)
CO2: 36 mmol/L — ABNORMAL HIGH (ref 22–32)
Calcium: 9.6 mg/dL (ref 8.9–10.3)
Creatinine, Ser: 0.89 mg/dL (ref 0.44–1.00)
GFR calc Af Amer: >60 mL/min (ref >60)
GFR calc non Af Amer: >60 mL/min (ref >60)
GLUCOSE: 99 mg/dL (ref 65–99)
POTASSIUM: 5.3 mmol/L — AB (ref 3.5–5.1)
Sodium: 139 mmol/L (ref 135–145)

## 2016-08-08 LAB — CBC
HEMATOCRIT: 50.8 % — AB (ref 36.0–46.0)
Hemoglobin: 15.3 g/dL — ABNORMAL HIGH (ref 12.0–15.0)
MCH: 28.5 pg (ref 26.0–34.0)
MCHC: 30.1 g/dL (ref 30.0–36.0)
MCV: 94.8 fL (ref 78.0–100.0)
Platelets: 252 10*3/uL (ref 150–400)
RBC: 5.36 MIL/uL — ABNORMAL HIGH (ref 3.87–5.11)
RDW: 15.3 % (ref 11.5–15.5)
WBC: 8 10*3/uL (ref 4.0–10.5)

## 2016-08-08 LAB — URIC ACID: Uric Acid, Serum: 6 mg/dL (ref 2.3–6.6)

## 2016-08-08 MED ORDER — SPIRONOLACTONE 25 MG PO TABS: 12.5000 mg | ORAL_TABLET | Freq: Every day | ORAL | 3 refills | Status: DC

## 2016-08-08 MED ORDER — DOXYCYCLINE HYCLATE 50 MG PO CAPS: 100.0000 mg | ORAL_CAPSULE | Freq: Two times a day (BID) | ORAL | 0 refills | Status: AC

## 2016-08-08 NOTE — Patient Instructions (Addendum)
STOP Potassium START Spironolactone 12.5 mg  (one half tab) daily START Doxycycline 100 mg, twice a day for 7 days  Labs today We will only contact you if something comes back abnormal or we need to make some changes. Otherwise no news is good news!  Labs needed again in 1 week  Your physician has recommended that you have a sleep study. This test records several body functions during sleep, including: brain activity, eye movement, oxygen and carbon dioxide blood levels, heart rate and rhythm, breathing rate and rhythm, the flow of air through your mouth and nose, snoring, body muscle movements, and chest and belly movement.  You have been referred to [facility]- Dr Katy Fitch for further evaluation of bursa in left elbow   You have been referred to Pulmonary rehab, they will contact you for orientation  Your physician recommends that you schedule a follow-up appointment in: 4 weeks with Dr Haroldine Laws  Do the following things EVERYDAY: 1) Weigh yourself in the morning before breakfast. Write it down and keep it in a log. 2) Take your medicines as prescribed 3) Eat low salt foods-Limit salt (sodium) to 2000 mg per day.  4) Stay as active as you can everyday 5) Limit all fluids for the day to less than 2 liters

## 2016-08-08 NOTE — Progress Notes (Signed)
ADVANCED HF CLINIC NOTE  HPI:  Deanna Johnston) is a 71 year old female with PMH of COPD with ongoing tobacco use, CAD s/p NSTEMI with RCA stents in 2011 and obesity.  Admitted to [facility] on 07/20/16 with progressive dyspnea and LE edema. Upon arrival to ED BNP 1113.9, WBC 11.6, Lactic 1.45, ABG 7.3/60/63. Hypotensive with systolic 54-00, oxygenation 90s on 4L [place], received 500 ml bolus and Rocephin/Azithromycin. Admitted to CCM.   Echo 07/21/16: LV EF 65-70% with dilated RV and moderate RV HK. Started on dopamine for RV support. CT chest no PE. Diuresed well and renal function improved.   Underwent RHC/LHC on 07/26/16   Mid RCA to Dist RCA lesion, 0 %stenosed.  Mid RCA lesion, 20 %stenosed.  Dist RCA lesion, 30 %stenosed.  Dist Cx lesion, 60 %stenosed.  Prox LAD to Mid LAD lesion, 20 %stenosed.  The left ventricular ejection fraction is greater than 65% by visual estimate. Findings: Done on dopamine 26mcg/kg/min Ao = 110/47 (72) LV = 120/10 RA = 5 RV = 47/7 PA = 42/16 (27) PCW = 6 Fick cardiac output/index = 5.7/3.4 PVR = 4.2 WU SVR 945 Ao sat = 88% PA sat = 71%, 69% High SVC sat = 69% Assessment: 1. Heavily calcified coronary arteries with only minimal luminal CAD. Widely patent RCA stent 2. LVEF 70% without regional wall motion abnormalities 3. Mild PAH with normal cardiac output on low-dose dopamine 4. No intracardiac shunt 4. PAD - central aortic pressure ~75mmHG greater than radial artery pressure or cuff pressure  CMRI done to evaluate RV dysfunction - LVEF 77% No RV infarct. Possible infiltrative process but not classic amyloid process. No RV scar  SPEP UPEP negative.  (cMRI reviewed with Dr. Meda Coffee and not felt to have infiltrative process)  Here fo post-hospital f/u. Feels much better  Volume status stable  Weight 162 (was 164 on discharge). Wearing O2. Breathing better. Able to do all ADLs without too much difficulty.  No CP. No orthopnea or PND. Mild LE  edema. No syncope or presyncope. Has not sleep study yet. Has quit smoking   SH:  Social History   Social History  . Marital status: Single    Spouse name: N/A  . Number of children: N/A  . Years of education: N/A   Occupational History  . Retired Retired   Social History Main Topics  . Smoking status: Former Smoker    Packs/day: 0.50    Years: 47.00    Types: Cigarettes    Quit date: 07/20/2016  . Smokeless tobacco: Never Used  . Alcohol use 0.0 oz/week     Comment: Rare  . Drug use: No  . Sexual activity: No     Comment: HYST-1st intercourse 71 yo-More than 5 partners   Other Topics Concern  . Not on file   Social History Narrative  . No narrative on file    FH:  Family History  Problem Relation Age of Onset  . Heart failure Mother   . Stomach cancer Mother   . Heart disease Father   . Diabetes Father   . Colon cancer Sister   . Heart disease Brother     Past Medical History:  Diagnosis Date  . Cataract   . Cervical dysplasia   . COPD (chronic obstructive pulmonary disease) ([place])   . Coronary artery disease   . Hyperlipidemia   . MI (myocardial infarction) [facility]) Dec. 2011   stent mid right coronary  . Obesity   .  Tobacco dependence   . VAIN (vaginal intraepithelial neoplasia) 2011   Efudex treatment    Current Outpatient Prescriptions  Medication Sig Dispense Refill  . aspirin EC 81 MG tablet Take 81 mg by mouth daily.    . furosemide (LASIX) 40 MG tablet Take 1 tablet (40 mg total) by mouth daily. 30 tablet 1  . ipratropium-albuterol (DUONEB) 0.5-2.5 (3) MG/3ML SOLN Take 3 mLs by nebulization 2 (two) times daily. 360 mL 0  . potassium chloride SA (K-DUR,KLOR-CON) 20 MEQ tablet Take 1 tablet (20 mEq total) by mouth daily. 30 tablet 0  . nicotine (NICODERM CQ - DOSED IN MG/24 HR) 7 mg/24hr patch Place 1 patch (7 mg total) onto the skin daily. (Patient not taking: Reported on 08/08/2016) 28 patch 0  . nitroGLYCERIN (NITROSTAT) 0.4 MG SL tablet PLACE 1  TABLET UNDER THE TONGUE EVERY 5 MINUTES AS NEEDED FOR CHEST PAIN (Patient not taking: Reported on 08/08/2016) 75 tablet 0   No current facility-administered medications for this encounter.     Vitals:   08/08/16 1522  BP: (!) 120/54  Pulse: 64  SpO2: 98%  Weight: 162 lb 6.4 oz (73.7 kg)    PHYSICAL EXAM:  General:  Well appearing. No resp difficulty. Wearing O2 HEENT: normal Neck: supple. JVP flat. Carotids 2+ bilaterally; no bruits. No lymphadenopathy or thryomegaly appreciated. Cor: PMI normal. Regular rate & rhythm. No rubs, gallops or murmurs. Lungs: clear Abdomen: soft, nontender, nondistended. No hepatosplenomegaly. No bruits or masses. Good bowel sounds. Extremities: no cyanosis, clubbing, rash, 1+ edema. Left elbow red/swollen with open sore Neuro: alert & orientedx3, cranial nerves grossly intact. Moves all 4 extremities w/o difficulty. Affect pleasant.   ASSESSMENT & PLAN:  1. RV failure - Suspect sue to COPD and untreated OSA - No evidence of RV infarct or infiltrative process on cMRI - Volume status improved but still with mild edema - Stop kcl - Start spiro 12.5 mg daily - Check labs today and in 1 week - Continue supplemental O2 - Schedule sleep study - Refer to Pulmonary rehab - Echo in 2 months to reassess RV and PA pressures  2. CAD - s/p inferior MI 2011 with RCA stents - cath with non-obstructive CAD - No s/s ischemia.  - Continue ASA/statin  3. Hypotension - Invasive BP was ~25 points higher than cuff or radial BP. Suspect upper extremity PAD. Currently asymptomatic  4. Tobacco use and chronic hypoxic respiratory failure - Currently quit tobacco. Urged her to remain quit - Continue O2. Follow up with Pulmonary - Refer to Pulmonary Rehab  5. Left elbow olecranon bursitis with open wound - Start doxy. Refer to Dr. Ninfa Linden (ortho)  Glori Bickers, MD  11:59 PM

## 2016-08-11 ENCOUNTER — Telehealth: Payer: Self-pay | Admitting: Internal Medicine

## 2016-08-11 DIAGNOSIS — J449 Chronic obstructive pulmonary disease, unspecified: Secondary | ICD-10-CM

## 2016-08-11 NOTE — Telephone Encounter (Signed)
Called and spoke with the pts POC--Deanna Johnston.  She stated that the pt has an Set designer for her health care and that the nurse came out and assessed her the other day and said that she would qualify for the daily help.  They are requesting:  POC Caregiver to help with ADL's and any other help would be great for the pt.  MW please advise. Pt has a pending appt with TP on 7/5 in [place] office. Thanks

## 2016-08-11 NOTE — Telephone Encounter (Signed)
Spoke with pt's daughter, aware of recs.  Order placed to [facility] for nebulizer machine.  Pt will have to be seen in clinic and have O2 tested prior to a POC being able to be ordered- pt not seen since 2015, no supplemental O2 documented in chart.  Nothing further needed.

## 2016-08-11 NOTE — Telephone Encounter (Signed)
We can sign for the neb and pulmonary medicines but PT should be directed by the PCP esp since the pt was d/c by [facility] and was supposed to f/u with PCP by now (per discharge summary instructions)

## 2016-08-14 ENCOUNTER — Ambulatory Visit (HOSPITAL_COMMUNITY)
Admission: RE | Admit: 2016-08-14 | Discharge: 2016-08-14 | Disposition: A | Discharge status: Home / Self Care | Payer: Medicare Other | Source: Ambulatory Visit | Attending: Internal Medicine | Admitting: Internal Medicine

## 2016-08-14 DIAGNOSIS — I50810 Right heart failure, unspecified: Secondary | ICD-10-CM | POA: Diagnosis present

## 2016-08-14 LAB — BASIC METABOLIC PANEL
Anion gap: 5 (ref 5–15)
BUN: 19 mg/dL (ref 6–20)
CHLORIDE: 97 mmol/L — AB (ref 101–111)
CO2: 36 mmol/L — ABNORMAL HIGH (ref 22–32)
Calcium: 9.6 mg/dL (ref 8.9–10.3)
Creatinine, Ser: 0.92 mg/dL (ref 0.44–1.00)
GFR calc Af Amer: >60 mL/min (ref >60)
GLUCOSE: 97 mg/dL (ref 65–99)
POTASSIUM: 4.3 mmol/L (ref 3.5–5.1)
Sodium: 138 mmol/L (ref 135–145)

## 2016-08-15 ENCOUNTER — Encounter: Payer: Medicare Other | Admitting: Gynecology

## 2016-08-15 ENCOUNTER — Other Ambulatory Visit (HOSPITAL_COMMUNITY): Payer: Medicare Other

## 2016-08-17 ENCOUNTER — Ambulatory Visit (INDEPENDENT_AMBULATORY_CARE_PROVIDER_SITE_OTHER): Payer: Medicare Other | Admitting: Adult Health

## 2016-08-17 ENCOUNTER — Telehealth: Payer: Self-pay | Admitting: Adult Health

## 2016-08-17 ENCOUNTER — Encounter: Payer: Self-pay | Admitting: Adult Health

## 2016-08-17 ENCOUNTER — Ambulatory Visit (HOSPITAL_BASED_OUTPATIENT_CLINIC_OR_DEPARTMENT_OTHER)
Admission: RE | Admit: 2016-08-17 | Discharge: 2016-08-17 | Disposition: A | Discharge status: Home / Self Care | Payer: Medicare Other | Source: Ambulatory Visit | Attending: Adult Health | Admitting: Adult Health

## 2016-08-17 VITALS — BP 108/70 | HR 95 | Ht <= 58 in | Wt 159.0 lb

## 2016-08-17 DIAGNOSIS — J9601 Acute respiratory failure with hypoxia: Secondary | ICD-10-CM

## 2016-08-17 DIAGNOSIS — J449 Chronic obstructive pulmonary disease, unspecified: Secondary | ICD-10-CM | POA: Insufficient documentation

## 2016-08-17 DIAGNOSIS — J9602 Acute respiratory failure with hypercapnia: Secondary | ICD-10-CM | POA: Diagnosis not present

## 2016-08-17 DIAGNOSIS — J9611 Chronic respiratory failure with hypoxia: Secondary | ICD-10-CM

## 2016-08-17 DIAGNOSIS — R570 Cardiogenic shock: Secondary | ICD-10-CM | POA: Diagnosis not present

## 2016-08-17 DIAGNOSIS — R918 Other nonspecific abnormal finding of lung field: Secondary | ICD-10-CM | POA: Diagnosis not present

## 2016-08-17 DIAGNOSIS — I517 Cardiomegaly: Secondary | ICD-10-CM | POA: Insufficient documentation

## 2016-08-17 MED ORDER — UMECLIDINIUM-VILANTEROL 62.5-25 MCG/INH IN AEPB: 1.0000 | INHALATION_SPRAY | Freq: Every day | RESPIRATORY_TRACT | 2 refills | Status: DC

## 2016-08-17 MED ORDER — UMECLIDINIUM-VILANTEROL 62.5-25 MCG/INH IN AEPB: 1.0000 | INHALATION_SPRAY | Freq: Every day | RESPIRATORY_TRACT | 0 refills | Status: DC

## 2016-08-17 NOTE — Progress Notes (Signed)
@Patient  ID: Deanna Johnston, female    DOB: 03-25-1945, 71 y.o.   MRN: [id number]  Chief Complaint  Patient presents with  . Follow-up    COPD     Referring provider: Kelton Pillar, MD  HPI: 71 yo female former smoker (07/2016 ) followed for COPD GOLD II  Previous ACE Cough (ACE d/c 02/2013)   TEST  - PFTs 04/14/2013  FEV1  1.03 (59%) ratio 69 and no better with dlco 66%  08/17/2016 Follow up : COPD /Post hospital follow up  Pt presents for a post hospital follow up . She was last seen in office 2015.  She was admitted last month for severe illness with hypoxic and hypercarbic respiratory failure that required BIPAP and aggressive diuresis . Initially in cardiogenic shock required vasopressors. Echo showed RV heart failure w/ mod to severe reduced RV systolic function. EF 65-70%, Gr 1 DD . She underwent R/L HC w/ non obstructive dz, mild Pulmonary HTN . Cardiac MRI did not support amyloidosis . She was recommended to continue on O2 , diuresis and set up for OP sleep study (scheduled for 10/09/16) .  Since discharge she is feeling better with less dyspnea. Denies cough or wheezing .  Does not like using oxygen tanks. She is on 3l/m O2 .  O2 sats in office at rest 88% on room air. On 2l/m walking o2 sat 96%.  She was discharged on duoneb Twice daily  . She has stopped smoking .  Previous PFT in 2015 showed moderate COPD with FEV1 at 59%. She cut back on smoking at that time but was unable to stop totally. Since discharge she has not smoked at all.  Has some LE swelling but is better and weight is down at 159lbs (164 at discharge) . She was seen by cardiology with spironolactone added. Echo planned for repeat in 2 months.      Allergies  Allergen Reactions  . Lipitor [Atorvastatin]     myalgias  . Crestor [Rosuvastatin Calcium]     intolerant  . Prednisone     "just makes me sick"  . Sulfa Antibiotics Swelling  . Thyroid Hormones Other (See Comments)    Paranoid   . Niaspan  [Niacin] Nausea Only    nausea    Immunization History  Administered Date(s) Administered  . Pneumococcal-Unspecified 11/13/2012    Past Medical History:  Diagnosis Date  . Cataract   . Cervical dysplasia   . COPD (chronic obstructive pulmonary disease) ([place])   . Coronary artery disease   . Hyperlipidemia   . MI (myocardial infarction) [facility]) Dec. 2011   stent mid right coronary  . Obesity   . Tobacco dependence   . VAIN (vaginal intraepithelial neoplasia) 2011   Efudex treatment    Tobacco History: History  Smoking Status  . Former Smoker  . Packs/day: 0.50  . Years: 47.00  . Types: Cigarettes  . Quit date: 07/20/2016  Smokeless Tobacco  . Never Used   Counseling given: Not Answered   Outpatient Encounter Prescriptions as of 08/17/2016  Medication Sig  . aspirin EC 81 MG tablet Take 81 mg by mouth daily.  . furosemide (LASIX) 40 MG tablet Take 1 tablet (40 mg total) by mouth daily.  Marland Kitchen ipratropium-albuterol (DUONEB) 0.5-2.5 (3) MG/3ML SOLN Take 3 mLs by nebulization 2 (two) times daily. (Patient taking differently: Take 3 mLs by nebulization every 6 (six) hours as needed. )  . [EXPIRED] doxycycline (VIBRAMYCIN) 50 MG capsule Take 2 capsules (100  mg total) by mouth 2 (two) times daily.  . nicotine (NICODERM CQ - DOSED IN MG/24 HR) 7 mg/24hr patch Place 1 patch (7 mg total) onto the skin daily. (Patient not taking: Reported on 08/08/2016)  . nitroGLYCERIN (NITROSTAT) 0.4 MG SL tablet PLACE 1 TABLET UNDER THE TONGUE EVERY 5 MINUTES AS NEEDED FOR CHEST PAIN (Patient not taking: Reported on 08/08/2016)  . spironolactone (ALDACTONE) 25 MG tablet Take 0.5 tablets (12.5 mg total) by mouth daily. (Patient not taking: Reported on 08/17/2016)  . umeclidinium-vilanterol (ANORO ELLIPTA) 62.5-25 MCG/INH AEPB Inhale 1 puff into the lungs daily.  Marland Kitchen umeclidinium-vilanterol (ANORO ELLIPTA) 62.5-25 MCG/INH AEPB Inhale 1 puff into the lungs daily.   No facility-administered encounter medications  on file as of 08/17/2016.      Review of Systems  Constitutional:   No  weight loss, night sweats,  Fevers, chills,  +fatigue, or  lassitude.  HEENT:   No headaches,  Difficulty swallowing,  Tooth/dental problems, or  Sore throat,                No sneezing, itching, ear ache, nasal congestion, post nasal drip,   CV:  No chest pain,  Orthopnea, PND,   anasarca, dizziness, palpitations, syncope.   GI  No heartburn, indigestion, abdominal pain, nausea, vomiting, diarrhea, change in bowel habits, loss of appetite, bloody stools.   Resp: No shortness of breath with exertion or at rest.  No excess mucus, no productive cough,  No non-productive cough,  No coughing up of blood.  No change in color of mucus.  No wheezing.  No chest wall deformity  Skin: no rash or lesions.  GU: no dysuria, change in color of urine, no urgency or frequency.  No flank pain, no hematuria   MS:  No joint pain or swelling.  No decreased range of motion.  No back pain.    Physical Exam  BP 108/70 (BP Location: Left Arm, Patient Position: Sitting, Cuff Size: Normal)   Pulse 95   Ht 4\' 10"  (1.473 m)   Wt 159 lb (72.1 kg)   SpO2 95%   BMI 33.23 kg/m   GEN: A/Ox3; pleasant , NAD,    HEENT:  [place]/AT,  EACs-clear, TMs-wnl, NOSE-clear, THROAT-clear, no lesions, no postnasal drip or exudate noted. Class 2 MP airway   NECK:  Supple w/ fair ROM; no JVD; normal carotid impulses w/o bruits; no thyromegaly or nodules palpated; no lymphadenopathy.    RESP  Clear  P & A; w/o, wheezes/ rales/ or rhonchi. no accessory muscle use, no dullness to percussion  CARD:  RRR, no m/r/g, no peripheral edema, pulses intact, no cyanosis or clubbing.  GI:   Soft & nt; nml bowel sounds; no organomegaly or masses detected.   Musco: Warm bil, no deformities or joint swelling noted.   Neuro: alert, no focal deficits noted.    Skin: Warm, no lesions or rashes     Lab Results:  CBC    Component Value Date/Time   WBC 8.0  08/08/2016 1626   RBC 5.36 (H) 08/08/2016 1626   HGB 15.3 (H) 08/08/2016 1626   HCT 50.8 (H) 08/08/2016 1626   PLT 252 08/08/2016 1626   MCV 94.8 08/08/2016 1626   MCH 28.5 08/08/2016 1626   MCHC 30.1 08/08/2016 1626   RDW 15.3 08/08/2016 1626   LYMPHSABS 1.4 07/29/2016 0321   MONOABS 0.6 07/29/2016 0321   EOSABS 0.4 07/29/2016 0321   BASOSABS 0.1 07/29/2016 0321    BMET  Component Value Date/Time   NA 138 08/14/2016 1402   K 4.3 08/14/2016 1402   CL 97 (L) 08/14/2016 1402   CO2 36 (H) 08/14/2016 1402   GLUCOSE 97 08/14/2016 1402   BUN 19 08/14/2016 1402   CREATININE 0.92 08/14/2016 1402   CREATININE 0.83 04/22/2010 0846   CALCIUM 9.6 08/14/2016 1402   GFRNONAA >60 08/14/2016 1402   GFRAA >60 08/14/2016 1402    BNP    Component Value Date/Time   BNP 1,113.9 (H) 07/20/2016 2107    ProBNP No results found for: PROBNP  Imaging: Dg Chest 2 View  Result Date: 08/17/2016 CLINICAL DATA:  COPD. EXAM: CHEST  2 VIEW COMPARISON:  07/27/2016 . FINDINGS: Mediastinum hilar structures normal. Mild cardiomegaly. No pulmonary venous congestion. Low lung volumes with mild bibasilar atelectasis. Small bilateral pleural effusions versus pleural scarring. IMPRESSION: 1. Mild cardiomegaly.  No pulmonary venous congestion. 2. Mild bibasilar subsegmental atelectasis and/or scarring. Small bilateral pleural effusions versus scarring . Electronically Signed   By: Marcello Moores  Register   On: 08/17/2016 10:49   Ct Angio Chest Pe W Or Wo Contrast  Result Date: 07/21/2016 CLINICAL DATA:  Hypoxia, elevated D-dimer level. EXAM: CT ANGIOGRAPHY CHEST WITH CONTRAST TECHNIQUE: Multidetector CT imaging of the chest was performed using the standard protocol during bolus administration of intravenous contrast. Multiplanar CT image reconstructions and MIPs were obtained to evaluate the vascular anatomy. CONTRAST:  100 mL of Isovue 370 intravenously. COMPARISON:  CT scan of February 04, 2010. FINDINGS:  Cardiovascular: Satisfactory opacification of the pulmonary arteries to the segmental level. No evidence of pulmonary embolism. Normal heart size. No pericardial effusion. Atherosclerosis of thoracic aorta is noted without aneurysm formation. Enlargement of pulmonary arteries is noted suggesting pulmonary artery hypertension. Coronary artery calcifications are noted. Mediastinum/Nodes: No enlarged mediastinal, hilar, or axillary lymph nodes. Thyroid gland, trachea, and esophagus demonstrate no significant findings. Lungs/Pleura: Minimal bilateral pleural effusions are noted with adjacent subsegmental atelectasis. No pneumothorax is noted. Right middle lobe atelectasis or infiltrate is noted. Upper Abdomen: No acute abnormality. Musculoskeletal: No chest wall abnormality. No acute or significant osseous findings. Multilevel degenerative disc disease is noted in the thoracic spine. Review of the MIP images confirms the above findings. IMPRESSION: No definite evidence of pulmonary embolus. Aortic atherosclerosis. Coronary artery calcifications are noted suggesting coronary artery disease. Enlargement of pulmonary arteries are noted suggesting pulmonary artery hypertension. Minimal bilateral pleural effusions are noted with adjacent subsegmental atelectasis. Right middle lobe atelectasis or pneumonia is noted as well. Electronically Signed   By: Marijo Conception, M.D.   On: 07/21/2016 12:35   Dg Chest Port 1 View  Result Date: 07/27/2016 CLINICAL DATA:  Patient admitted 07/20/2016 with a 2 week history of bilateral lower extremity swelling. Shortness of breath. EXAM: PORTABLE CHEST 1 VIEW COMPARISON:  Single-view of the chest 07/26/2016 and 07/24/2016. FINDINGS: Right PICC is unchanged. Streaky bibasilar airspace opacities persist. Aeration in the right lung base is mildly improved. There is cardiomegaly and vascular congestion. Aortic atherosclerosis is noted. No pneumothorax. IMPRESSION: Cardiomegaly and vascular  congestion. Streaky bibasilar airspace opacities are likely due to atelectasis and improved on the right. Atherosclerosis. Electronically Signed   By: Inge Rise M.D.   On: 07/27/2016 07:56   Dg Chest Port 1 View  Result Date: 07/26/2016 CLINICAL DATA:  Shortness of breath, respiratory failure. History of CHF, previous MI, COPD, current smoker. EXAM: PORTABLE CHEST 1 VIEW COMPARISON:  Portable chest x-ray of June eleventh 2018 FINDINGS: The lungs are adequately inflated.  The interstitial markings remain increased. The hemidiaphragms are better demonstrated. Coarse lung markings at both bases are slightly less conspicuous. The cardiac silhouette is enlarged. The central pulmonary vascularity remains engorged. There is calcification in the wall of the aortic arch. There is no significant pleural effusion. IMPRESSION: CHF superimposed upon COPD. Probable bibasilar atelectasis or interstitial pneumonia. Overall there has been mild interval improvement in the appearance of the pulmonary interstitium since yesterday's study. Electronically Signed   By: David  Martinique M.D.   On: 07/26/2016 08:05   Dg Chest Port 1 View  Result Date: 07/24/2016 CLINICAL DATA:  71 year old female with shortness of breath. Subsequent encounter. EXAM: PORTABLE CHEST 1 VIEW COMPARISON:  07/23/2016 chest x-ray and 07/21/2016 chest CT. FINDINGS: Right PICC line tip distal superior vena cava level. Cardiomegaly. Pulmonary edema similar to prior exam. Left base subsegmental atelectasis. Calcified aorta. Prominence of pulmonary artery suggestive of pulmonary arterial hypertension. IMPRESSION: Right PICC line has been place with the tip at the level of the distal superior vena cava. Cardiomegaly. Pulmonary edema and left base subsegmental atelectasis similar to prior exam. Aortic atherosclerosis. Electronically Signed   By: Genia Del M.D.   On: 07/24/2016 07:05   Dg Chest Port 1 View  Result Date: 07/23/2016 CLINICAL DATA:   Respiratory failure EXAM: PORTABLE CHEST 1 VIEW COMPARISON:  July 22, 2016 FINDINGS: There remains interstitial edema with patchy alveolar consolidation in the bases. There are small bilateral pleural effusions. There is atelectatic change in the medial left base, stable. There is cardiomegaly with pulmonary venous hypertension. There is aortic atherosclerosis. No adenopathy evident. No bone lesions. IMPRESSION: Persistent changes of congestive heart failure, not appreciably changed. No new opacity evident. There is aortic atherosclerosis. Electronically Signed   By: Lowella Grip III M.D.   On: 07/23/2016 07:31   Dg Chest Port 1 View  Result Date: 07/22/2016 CLINICAL DATA:  71 year old female with respiratory failure, congestive heart failure. Two weeks of progressive shortness of breath and lower extremity swelling. Undergoing diuresis. EXAM: PORTABLE CHEST 1 VIEW COMPARISON:  Chest CTA 07/21/2016 and earlier FINDINGS: Portable AP semi upright view at at 0458 hours. Chest CTA yesterday demonstrated dependent opacity and small bilateral pleural effusions. Pulmonary vascular congestion appears mildly increased compared to 07/20/2016. Bibasilar opacity is stable since yesterday. No pneumothorax. Stable cardiomegaly and mediastinal contours. Calcified aortic atherosclerosis. Visualized tracheal air column is within normal limits. IMPRESSION: 1. Pulmonary vascular congestion appears mildly worsened since 07/20/2016. 2. Stable patchy bibasilar opacity since yesterday favored to be atelectasis. Electronically Signed   By: Genevie Ann M.D.   On: 07/22/2016 07:33   Dg Chest Port 1 View  Result Date: 07/21/2016 CLINICAL DATA:  Shortness of breath. EXAM: PORTABLE CHEST 1 VIEW COMPARISON:  07/20/2016 . FINDINGS: Cardiomegaly. Diffuse bilateral from interstitial prominence with bilateral pleural effusions. Findings consistent with CHF. Bibasilar pneumonia cannot be excluded. No pneumothorax P IMPRESSION: Congestive heart  failure bibasilar pulmonary interstitial edema and small pleural effusions. Electronically Signed   By: Marcello Moores  Register   On: 07/21/2016 07:28   Dg Chest Portable 1 View  Result Date: 07/20/2016 CLINICAL DATA:  Shortness of breath.  Lower extremity edema. EXAM: PORTABLE CHEST 1 VIEW COMPARISON:  03/11/2013 FINDINGS: Mild cardiomegaly. Atherosclerosis of the thoracic aorta. Heterogeneous bibasilar opacities are increased from prior exam. Mild vascular congestion. Possible blunting of the costophrenic angles. No pneumothorax. The bones appear under mineralized. IMPRESSION: Mild cardiomegaly in vascular congestion. Heterogeneous bibasilar opacities may be atelectasis, pneumonia or pulmonary edema. Thoracic aortic atherosclerosis.  Electronically Signed   By: Jeb Levering M.D.   On: 07/20/2016 21:42   Mr Cardiac Morphology W Wo Contrast  Result Date: 07/27/2016 CLINICAL DATA:  Congestive Heart Failure EXAM: CARDIAC MRI TECHNIQUE: The patient was scanned on a 1.5 Tesla GE magnet. A dedicated cardiac coil was used. Functional imaging was done using Fiesta sequences. 2,3, and 4 chamber views were done to assess for RWMA's. Modified Simpson's rule using a short axis stack was used to calculate an ejection fraction on a dedicated work Conservation officer, nature. The patient received 20 cc of Multihance. After 10 minutes inversion recovery sequences were used to assess for infiltration and scar tissue. CONTRAST:  20 cc Multihance FINDINGS: There was mild RAE. There was moderate RVE with hypokinesis. Thee was no ASD/PFO or VSD. There was no pericardial effusion. The LA and LV were normal in size and function. The septum was only 93mm in thickness. Morphologically there was no evidence of HOCM or constriction. The quantitative EF was 77% (EDV 65 cc ESV 15 cc SV 50 cc) The aortic root was normal. The AV was tri leaflet and normal The MV was normal without significant MR. There was mild TR. Post gadolinium on  delayed gadolinium images the myocardium could not be fully nulled. IMPRESSION: 1) No LVH Normal LV size and function EF 77% 2) Unable to fully null the LV myocardium on delayed inversion recovery sequence suggesting possibility of infiltrative DCM. However the morphology is not classic for amyloidosis 3) Moderate RV enlargement and hypokinesis with no infarct or diverticula 4) Mild RA enlargement 5) Normal valves mild TR 6) No pericardial effusion [place] Electronically Signed   By: Jenkins Rouge M.D.   On: 07/27/2016 19:01     Assessment & Plan:   COPD GOLD II Moderate COPD (last PFT in 2015 ) recently stopped smoking  Set up for PFT  Change from Duoneb to [facility] , if too expensive will change back to Duoneb but at Three times a day  .  Pt aware to use Duoneb As needed  Now .   Plan  Patient Instructions  Begin Anoro 1 puff daily . Rinse after use.  Great job not smoking . Keep up good work .  Chest xray today .  Decrease Oxygen 2l/m .  Order for POC oxygen device.  Go for sleep study as planned.  follow up with Dr. Melvyn Novas  In August with PFT .  Please contact office for sooner follow up if symptoms do not improve or worsen or seek emergency care      Cardiogenic shock [facility]) Recent flare with cor pulmonale She is to continue on diureiss and cardiology follow up  Repeat echo per cards Sleep study and PFT pending .    Chronic respiratory failure with hypoxia (HCC) O2 demands are decreased to 2l/m .   Plan  Patient Instructions  Begin Anoro 1 puff daily . Rinse after use.  Great job not smoking . Keep up good work .  Chest xray today .  Decrease Oxygen 2l/m .  Order for POC oxygen device.  Go for sleep study as planned.  follow up with Dr. Melvyn Novas  In August with PFT .  Please contact office for sooner follow up if symptoms do not improve or worsen or seek emergency care         Rexene Edison, NP 08/17/2016

## 2016-08-17 NOTE — Progress Notes (Signed)
Chart and office note reviewed in detail  > agree with a/p as outlined    

## 2016-08-17 NOTE — Assessment & Plan Note (Signed)
O2 demands are decreased to 2l/m .   Plan  Patient Instructions  Begin Anoro 1 puff daily . Rinse after use.  Great job not smoking . Keep up good work .  Chest xray today .  Decrease Oxygen 2l/m .  Order for POC oxygen device.  Go for sleep study as planned.  follow up with Dr. Melvyn Novas  In August with PFT .  Please contact office for sooner follow up if symptoms do not improve or worsen or seek emergency care

## 2016-08-17 NOTE — Assessment & Plan Note (Signed)
Moderate COPD (last PFT in 2015 ) recently stopped smoking  Set up for PFT  Change from Duoneb to [facility] , if too expensive will change back to Duoneb but at Three times a day  .  Pt aware to use Duoneb As needed  Now .   Plan  Patient Instructions  Begin Anoro 1 puff daily . Rinse after use.  Great job not smoking . Keep up good work .  Chest xray today .  Decrease Oxygen 2l/m .  Order for POC oxygen device.  Go for sleep study as planned.  follow up with Dr. Melvyn Novas  In August with PFT .  Please contact office for sooner follow up if symptoms do not improve or worsen or seek emergency care

## 2016-08-17 NOTE — Assessment & Plan Note (Signed)
Recent flare with cor pulmonale She is to continue on diureiss and cardiology follow up  Repeat echo per cards Sleep study and PFT pending .

## 2016-08-17 NOTE — Telephone Encounter (Signed)
Spoke with patient.  She had questions about the POC order and oxygen levels. Looked at TP's notes from [facility]. Advised patient that she wanted her to decrease to 2L and that she wanted her to have a POC so that she could move around better.   Patient verbalized understanding. Nothing further needed at time of call.

## 2016-08-17 NOTE — Patient Instructions (Addendum)
Begin Anoro 1 puff daily . Rinse after use.  Great job not smoking . Keep up good work .  Chest xray today .  Decrease Oxygen 2l/m .  Order for POC oxygen device.  Go for sleep study as planned.  follow up with Dr. Melvyn Novas  In August with PFT .  Please contact office for sooner follow up if symptoms do not improve or worsen or seek emergency care

## 2016-08-22 ENCOUNTER — Ambulatory Visit (INDEPENDENT_AMBULATORY_CARE_PROVIDER_SITE_OTHER): Payer: Medicare Other | Admitting: Physician Assistant

## 2016-08-22 DIAGNOSIS — M7022 Olecranon bursitis, left elbow: Secondary | ICD-10-CM

## 2016-08-22 NOTE — Progress Notes (Signed)
Office Visit Note   Patient: Deanna Johnston           Date of Birth: 1945-07-09           MRN: [id number] Visit Date: 08/22/2016              Requested by: Kelton Pillar, MD [address] [place], [place] [address] PCP: Kelton Pillar, MD   Assessment & Plan: Visit Diagnoses:  1. Olecranon bursitis of left elbow     Plan: Compression dressing was applied to the elbow she'll leave this on until this evening then remove it. She'll begin washing the arm in an antibacterial soap daily. Drying it well after washing. We'll see her back in 1 week to check progress lack of. Left elbow was prepped with Betadine F Clark is an SI skin 3 mL of 2% lidocaine was used to further Mrs. Isley elbow then 10 mL blood-tinged aspirate was obtained. Compression bandage applied  Follow-Up Instructions: Return in about 1 week (around 08/29/2016).   Orders:  No orders of the defined types were placed in this encounter.  No orders of the defined types were placed in this encounter.     Procedures: No procedures performed   Clinical Data: No additional findings.   Subjective: Left elbow bursitis  HPI Mrs. Tamm is a 71 year old female comes in today due to left elbow drainage and swelling. States is been ongoing for some years. She's recently hospitalized due to congestive heart failure and discharge some 3 weeks ago. She states that she receives antibiotics while she was in the hospital and this helped with her elbows. Right elbow she states woke "worse". Left elbow is looking better but still has some drainage shows she states she has some reddish color drainage from the elbow. She had no fevers chills. Past medical history is positive for coronary artery disease with stent placement history of an MRI is 7-8 years ago. COPD, CHF lower leg edema and hyperlipidemia. Per discharge summary she did have an acute renal failure which was resolved. She also is now on oxygen since her last  hospitalization. Review of Systems Denies any fevers or chills. Denies injury to either elbow.Please see history of present illness otherwise  Objective: Vital Signs: There were no vitals taken for this visit.  Physical Exam  Constitutional: She is oriented to person, place, and time. She appears well-developed and well-nourished. No distress.  Pulmonary/Chest: Effort normal.  Neurological: She is alert and oriented to person, place, and time.  Skin: She is not diaphoretic.  Psychiatric: She has a normal mood and affect. Her behavior is normal.    Ortho Exam Bilateral elbow she has good range of motion elbow is full supination pronation bilateral arms. Radial pulses are 2+ bilaterally. She has a well-healing right elbow olecranon bursitis without any fluctuance or erythema. Left elbow active left olecranon bursitis with small ulceration that is not actively draining. Specialty Comments:  No specialty comments available.  Imaging: No results found.   PMFS History: Patient Active Problem List   Diagnosis Date Noted  . Chronic respiratory failure with hypoxia ([facility]) 08/17/2016  . Right heart failure ([facility])   . Hypoxia   . PAH (pulmonary artery hypertension) ([facility])   . Pressure injury of skin 07/21/2016  . Hypotension   . Cardiogenic shock ([facility])   . Right ventricular failure ([place] [place])   . Acute respiratory failure with hypoxia and hypercapnia ([facility])   . Acute on chronic heart failure ([place])  07/20/2016  . COPD GOLD II 03/12/2013  . Cough 03/11/2013  . Essential hypertension, benign 03/11/2013  . Hyperlipidemia   . Coronary artery disease   . Obesity   . MI (myocardial infarction) ([facility])   . Tobacco dependence    Past Medical History:  Diagnosis Date  . Cataract   . Cervical dysplasia   . COPD (chronic obstructive pulmonary disease) ([facility])   . Coronary artery disease   . Hyperlipidemia   . MI (myocardial infarction) [facility]) Dec. 2011   stent mid right coronary  . Obesity   .  Tobacco dependence   . VAIN (vaginal intraepithelial neoplasia) 2011   Efudex treatment    Family History  Problem Relation Age of Onset  . Heart failure Mother   . Stomach cancer Mother   . Heart disease Father   . Diabetes Father   . Colon cancer Sister   . Heart disease Brother     Past Surgical History:  Procedure Laterality Date  . ABDOMINAL HYSTERECTOMY     TAH BSO  . anal fistula repair    . CARDIAC CATHETERIZATION  2011 Dec.   stent mid right coronary,Primus element stent  . CATARACT EXTRACTION    . COLPOSCOPY    . OOPHORECTOMY     BSO  . OTHER SURGICAL HISTORY     hysterectomy  . RIGHT/LEFT HEART CATH AND CORONARY ANGIOGRAPHY N/A 07/26/2016   Procedure: Right/Left Heart Cath and Coronary Angiography;  Surgeon: Jolaine Artist, MD;  Location: [facility];  Service: Cardiovascular;  Laterality: N/A;   Social History   Occupational History  . Retired Retired   Social History Main Topics  . Smoking status: Former Smoker    Packs/day: 0.50    Years: 47.00    Types: Cigarettes    Quit date: 07/20/2016  . Smokeless tobacco: Never Used  . Alcohol use 0.0 oz/week     Comment: Rare  . Drug use: No  . Sexual activity: No     Comment: HYST-1st intercourse 71 yo-More than 5 partners

## 2016-08-25 ENCOUNTER — Ambulatory Visit
Admission: RE | Admit: 2016-08-25 | Discharge: 2016-08-25 | Disposition: A | Discharge status: Home / Self Care | Payer: Medicare Other | Source: Ambulatory Visit | Attending: Family Medicine | Admitting: Family Medicine

## 2016-08-25 ENCOUNTER — Other Ambulatory Visit (HOSPITAL_COMMUNITY): Payer: Self-pay | Admitting: *Deleted

## 2016-08-25 ENCOUNTER — Other Ambulatory Visit: Payer: Self-pay | Admitting: Family Medicine

## 2016-08-25 DIAGNOSIS — M549 Dorsalgia, unspecified: Secondary | ICD-10-CM

## 2016-08-25 MED ORDER — FUROSEMIDE 40 MG PO TABS: 40.0000 mg | ORAL_TABLET | Freq: Every day | ORAL | 3 refills | Status: DC

## 2016-08-31 ENCOUNTER — Ambulatory Visit (INDEPENDENT_AMBULATORY_CARE_PROVIDER_SITE_OTHER): Payer: Medicare Other | Admitting: Physician Assistant

## 2016-08-31 ENCOUNTER — Encounter (INDEPENDENT_AMBULATORY_CARE_PROVIDER_SITE_OTHER): Payer: Self-pay | Admitting: Physician Assistant

## 2016-08-31 DIAGNOSIS — M7022 Olecranon bursitis, left elbow: Secondary | ICD-10-CM | POA: Diagnosis not present

## 2016-08-31 MED ORDER — LIDOCAINE HCL 1 % IJ SOLN
3.0000 mL | INTRAMUSCULAR | Status: AC | PRN
  Administered 2016-08-31: 3 mL

## 2016-08-31 MED ORDER — METHYLPREDNISOLONE ACETATE 40 MG/ML IJ SUSP
40.0000 mg | INTRAMUSCULAR | Status: AC | PRN
  Administered 2016-08-31: 40 mg via INTRAMUSCULAR

## 2016-08-31 NOTE — Progress Notes (Addendum)
   Procedure Note  Patient: DONIS KOTOWSKI             Date of Birth: 1946/02/02           MRN: [id number]             Visit Date: 08/31/2016 History of present illness: Mrs. Terhune returns today follow-up of her left elbow olecranon bursitis. She states overall that she is having less tenderness over the elbow. Overall she feels that the elbow is improved. Still have some soreness and some swelling. She's had no fevers or chills. Procedures: Visit Diagnoses: Olecranon bursitis of left elbow  Trigger Point Inj Date/Time: 08/31/2016 3:08 PM Performed by: Pete Pelt Authorized by: Pete Pelt   Indications:  Therapeutic Total # of Trigger Points:  1 Location: upper extremity   Approach:  Dorsal Medications #1:  3 mL lidocaine 1 %; 40 mg methylPREDNISolone acetate 40 MG/ML  8 mL of bloody aspirate removed. Patient tolerates well.  Plan: Again discussed with her keeping pressure off the elbow. Elbow was wrapped with an Ace bandage today. She will keep it wrapped until this evening and then removed the wrap. We'll see her back on an as-needed basis or if the fluid reaccumulate's or the bursitis becomes worse. Questions were encouraged and answered.

## 2016-09-07 ENCOUNTER — Telehealth: Payer: Self-pay | Admitting: Internal Medicine

## 2016-09-07 NOTE — Telephone Encounter (Signed)
Pt states that she is wanting to stay on Anoro - worked well but the Rx is too expensive, $45/month.  Pt saw TP in [place] and was placed on this and advised to call once completing the sample if Rx was too expensive. Pt is currently using her nebulizer-Duoneb BID.   Please advise TP of any alternatives that triage can check with the pharmacy about the copay. Thanks.

## 2016-09-07 NOTE — Telephone Encounter (Signed)
Can look at formulary but $45 is problably the lowest copayment .  Would take Duoneb .Three times a day  .  Can discuss in more detail on return  Could also try pt assitance for [facility] to see if she qualifies.

## 2016-09-08 MED ORDER — UMECLIDINIUM-VILANTEROL 62.5-25 MCG/INH IN AEPB: 1.0000 | INHALATION_SPRAY | Freq: Every day | RESPIRATORY_TRACT | 12 refills | Status: DC

## 2016-09-08 MED ORDER — UMECLIDINIUM-VILANTEROL 62.5-25 MCG/INH IN AEPB: 1.0000 | INHALATION_SPRAY | Freq: Every day | RESPIRATORY_TRACT | 0 refills | Status: DC

## 2016-09-08 NOTE — Telephone Encounter (Signed)
Spoke with patient. She would like to stay on the Anoro. She would like to apply for the patient assistance program with [place] for the Anoro. Advised patient that I would start on her paperwork and place 2 samples of Anoro for her to pickup at the front desk. Paperwork for Anoro will also be enclosed in the bag.   She verbalized understanding. Nothing else was needed at time of call.

## 2016-09-13 ENCOUNTER — Institutional Professional Consult (permissible substitution): Payer: Medicare Other | Admitting: Internal Medicine

## 2016-09-13 ENCOUNTER — Encounter (HOSPITAL_COMMUNITY): Payer: Self-pay | Admitting: *Deleted

## 2016-09-13 ENCOUNTER — Ambulatory Visit (HOSPITAL_COMMUNITY)
Admission: RE | Admit: 2016-09-13 | Discharge: 2016-09-13 | Disposition: A | Discharge status: Home / Self Care | Payer: Medicare Other | Source: Ambulatory Visit | Attending: Internal Medicine | Admitting: Internal Medicine

## 2016-09-13 ENCOUNTER — Encounter (HOSPITAL_COMMUNITY): Payer: Self-pay

## 2016-09-13 VITALS — BP 114/58 | HR 94 | Wt 155.4 lb

## 2016-09-13 DIAGNOSIS — Z955 Presence of coronary angioplasty implant and graft: Secondary | ICD-10-CM | POA: Insufficient documentation

## 2016-09-13 DIAGNOSIS — E785 Hyperlipidemia, unspecified: Secondary | ICD-10-CM | POA: Insufficient documentation

## 2016-09-13 DIAGNOSIS — I252 Old myocardial infarction: Secondary | ICD-10-CM | POA: Diagnosis not present

## 2016-09-13 DIAGNOSIS — M71022 Abscess of bursa, left elbow: Secondary | ICD-10-CM

## 2016-09-13 DIAGNOSIS — Z79899 Other long term (current) drug therapy: Secondary | ICD-10-CM | POA: Diagnosis not present

## 2016-09-13 DIAGNOSIS — Z8249 Family history of ischemic heart disease and other diseases of the circulatory system: Secondary | ICD-10-CM | POA: Diagnosis not present

## 2016-09-13 DIAGNOSIS — Z6832 Body mass index (BMI) 32.0-32.9, adult: Secondary | ICD-10-CM | POA: Insufficient documentation

## 2016-09-13 DIAGNOSIS — G4733 Obstructive sleep apnea (adult) (pediatric): Secondary | ICD-10-CM | POA: Insufficient documentation

## 2016-09-13 DIAGNOSIS — I509 Heart failure, unspecified: Secondary | ICD-10-CM

## 2016-09-13 DIAGNOSIS — E669 Obesity, unspecified: Secondary | ICD-10-CM | POA: Insufficient documentation

## 2016-09-13 DIAGNOSIS — J9611 Chronic respiratory failure with hypoxia: Secondary | ICD-10-CM | POA: Insufficient documentation

## 2016-09-13 DIAGNOSIS — J449 Chronic obstructive pulmonary disease, unspecified: Secondary | ICD-10-CM | POA: Insufficient documentation

## 2016-09-13 DIAGNOSIS — Z833 Family history of diabetes mellitus: Secondary | ICD-10-CM | POA: Diagnosis not present

## 2016-09-13 DIAGNOSIS — Z7982 Long term (current) use of aspirin: Secondary | ICD-10-CM | POA: Insufficient documentation

## 2016-09-13 DIAGNOSIS — Z800 Family history of malignant neoplasm of digestive organs: Secondary | ICD-10-CM | POA: Diagnosis not present

## 2016-09-13 DIAGNOSIS — I2510 Atherosclerotic heart disease of native coronary artery without angina pectoris: Secondary | ICD-10-CM

## 2016-09-13 DIAGNOSIS — Z87891 Personal history of nicotine dependence: Secondary | ICD-10-CM | POA: Insufficient documentation

## 2016-09-13 DIAGNOSIS — I50810 Right heart failure, unspecified: Secondary | ICD-10-CM | POA: Diagnosis not present

## 2016-09-13 LAB — BASIC METABOLIC PANEL
Anion gap: 10 (ref 5–15)
BUN: 13 mg/dL (ref 6–20)
CO2: 31 mmol/L (ref 22–32)
CREATININE: 0.82 mg/dL (ref 0.44–1.00)
Calcium: 9.3 mg/dL (ref 8.9–10.3)
Chloride: 96 mmol/L — ABNORMAL LOW (ref 101–111)
GFR calc Af Amer: >60 mL/min (ref >60)
GFR calc non Af Amer: >60 mL/min (ref >60)
Glucose, Bld: 125 mg/dL — ABNORMAL HIGH (ref 65–99)
Potassium: 3.3 mmol/L — ABNORMAL LOW (ref 3.5–5.1)
Sodium: 137 mmol/L (ref 135–145)

## 2016-09-13 NOTE — Progress Notes (Signed)
Received forms from [place] for patient.  Forms completed and mailed today.  Copy provided to patient.  Original forms will be scanned to patient's electronic medical record.

## 2016-09-13 NOTE — Progress Notes (Signed)
ADVANCED HF CLINIC NOTE  HPI:  Deanna Johnston) is a 71 year old female with PMH of COPD with ongoing tobacco use, CAD s/p NSTEMI with RCA stents in 2011 and obesity.  Admitted to [facility] on 07/20/16 with progressive dyspnea and LE edema. Upon arrival to ED BNP 1113.9, WBC 11.6, Lactic 1.45, ABG 7.3/60/63. Hypotensive with systolic 19-62, oxygenation 90s on 4L [place], received 500 ml bolus and Rocephin/Azithromycin. Admitted to [facility].   Echo 07/21/16: LV EF 65-70% with dilated RV and moderate RV HK. Started on dopamine for RV support. CT chest no PE. Diuresed well and renal function improved.   Underwent RHC/LHC on 07/26/16   Mid RCA to Dist RCA lesion, 0 %stenosed.  Mid RCA lesion, 20 %stenosed.  Dist RCA lesion, 30 %stenosed.  Dist Cx lesion, 60 %stenosed.  Prox LAD to Mid LAD lesion, 20 %stenosed.  The left ventricular ejection fraction is greater than 65% by visual estimate. Findings: Done on dopamine 34mcg/kg/min Ao = 110/47 (72) LV = 120/10 RA = 5 RV = 47/7 PA = 42/16 (27) PCW = 6 Fick cardiac output/index = 5.7/3.4 PVR = 4.2 WU SVR 945 Ao sat = 88% PA sat = 71%, 69% High SVC sat = 69% Assessment: 1. Heavily calcified coronary arteries with only minimal luminal CAD. Widely patent RCA stent 2. LVEF 70% without regional wall motion abnormalities 3. Mild PAH with normal cardiac output on low-dose dopamine 4. No intracardiac shunt 4. PAD - central aortic pressure ~28mmHG greater than radial artery pressure or cuff pressure  CMRI done to evaluate RV dysfunction - LVEF 77% No RV infarct. Possible infiltrative process but not classic amyloid process. No RV scar  SPEP UPEP negative.  (cMRI reviewed with Dr. Meda Coffee and not felt to have infiltrative process)   She presents today for HF follow up. Feeling well overall. She continues to use oxygen all day. No SOB with walking into clinic, no SOB with walking around her house. She is taking all of her medications. Weights at  home are stable at 154 pounds. She is down 4 pound since last visit. Eating mostly low sodium foods. Drinking more than 2L a day some days. Denies chest pain, orthopnea and PND.      SH:  Social History   Social History  . Marital status: Single    Spouse name: N/A  . Number of children: N/A  . Years of education: N/A   Occupational History  . Retired Retired   Social History Main Topics  . Smoking status: Former Smoker    Packs/day: 0.50    Years: 47.00    Types: Cigarettes    Quit date: 07/20/2016  . Smokeless tobacco: Never Used  . Alcohol use 0.0 oz/week     Comment: Rare  . Drug use: No  . Sexual activity: No     Comment: HYST-1st intercourse 71 yo-More than 5 partners   Other Topics Concern  . Not on file   Social History Narrative  . No narrative on file    FH:  Family History  Problem Relation Age of Onset  . Heart failure Mother   . Stomach cancer Mother   . Heart disease Father   . Diabetes Father   . Colon cancer Sister   . Heart disease Brother     Past Medical History:  Diagnosis Date  . Cataract   . Cervical dysplasia   . COPD (chronic obstructive pulmonary disease) ([place])   . Coronary artery disease   .  Hyperlipidemia   . MI (my[facility] infarction) [facility]) Dec. 2011   stent mid right coronary  . Obesity   . Tobacco dependence   . VAIN (vaginal intraepithelial neoplasia) 2011   Efudex treatment    Current Outpatient Prescriptions  Medication Sig Dispense Refill  . aspirin EC 81 MG tablet Take 81 mg by mouth daily.    . furosemide (LASIX) 40 MG tablet Take 1 tablet (40 mg total) by mouth daily. 30 tablet 3  . nitroGLYCERIN (NITROSTAT) 0.4 MG SL tablet PLACE 1 TABLET UNDER THE TONGUE EVERY 5 MINUTES AS NEEDED FOR CHEST PAIN (Patient not taking: Reported on 09/13/2016) 75 tablet 0   No current facility-administered medications for this encounter.     Vitals:   09/13/16 1534  BP: (!) 114/58  Pulse: 94  SpO2: 97%  Weight: 155 lb 6 oz  (70.5 kg)    PHYSICAL EXAM:  General:  Well appearing. No resp difficulty. Wearing O2 HEENT: normal Neck: supple. JVP flat. Carotids 2+ bilaterally; no bruits. No lymphadenopathy or thryomegaly appreciated. Cor: PMI normal. Regular rate & rhythm. No rubs, gallops or murmurs. Lungs: clear Abdomen: soft, nontender, nondistended. No hepatosplenomegaly. No bruits or masses. Good bowel sounds. Extremities: no cyanosis, clubbing, rash, 1+ edema. Left elbow red/swollen with open sore Neuro: alert & orientedx3, cranial nerves grossly intact. Moves all 4 extremities w/o difficulty. Affect pleasant.   ASSESSMENT & PLAN:  1. RV failure: Due to COPD and untreated OSA. [place] on 07/2016 with PVR 4.2 WU. CTA negative for PE.  - No evidence of RV infarct on cMRI in 07/2016 - Volume status stable on exam. Continue 40 mg Lasix. Will check BMET today.  - She is scheduled for a sleep study on 10/09/16.  - Will need repeat Echo scheduled next visit to assess PA pressures.   2. CAD: s/p inferior MI 2011 with RCA stenting.  - No signs or symptoms of ischemia - Continue ASA/statin  3. Hypotension - Resolved.   4. Tobacco use and chronic hypoxic respiratory failure - Has quit smoking   5. Left elbow olecranon bursitis with open wound -  Resolved   Follow up in 2 months with Dr. Haroldine Laws with an Echo.   Arbutus Leas, NP  3:45 PM

## 2016-09-13 NOTE — Patient Instructions (Signed)
Routine lab work today. Will notify you of abnormal results\  Follow up with Dr.Bensimhon in 2 months

## 2016-09-14 ENCOUNTER — Telehealth (HOSPITAL_COMMUNITY): Payer: Self-pay | Admitting: Cardiology

## 2016-09-14 DIAGNOSIS — I509 Heart failure, unspecified: Secondary | ICD-10-CM

## 2016-09-14 MED ORDER — POTASSIUM CHLORIDE CRYS ER 20 MEQ PO TBCR: 20.0000 meq | EXTENDED_RELEASE_TABLET | Freq: Every day | ORAL | 3 refills | Status: DC

## 2016-09-14 NOTE — Telephone Encounter (Signed)
-----   Message from Arbutus Leas, NP sent at 09/14/2016  7:41 AM EDT ----- Please start 108mEq of KCl, and please call and tell her that her K is 3.3. I am not sure if she has some K at home she can take,may need a new Rx called in. Recheck BMET next week.

## 2016-09-14 NOTE — Telephone Encounter (Signed)
Patient aware. Repeat labs 8/9

## 2016-09-21 ENCOUNTER — Ambulatory Visit (HOSPITAL_COMMUNITY)
Admission: RE | Admit: 2016-09-21 | Discharge: 2016-09-21 | Disposition: A | Discharge status: Home / Self Care | Payer: Medicare Other | Source: Ambulatory Visit | Attending: Internal Medicine | Admitting: Internal Medicine

## 2016-09-21 DIAGNOSIS — I509 Heart failure, unspecified: Secondary | ICD-10-CM

## 2016-09-21 LAB — BASIC METABOLIC PANEL
Anion gap: 8 (ref 5–15)
BUN: 14 mg/dL (ref 6–20)
CALCIUM: 9.4 mg/dL (ref 8.9–10.3)
CO2: 29 mmol/L (ref 22–32)
CREATININE: 0.79 mg/dL (ref 0.44–1.00)
Chloride: 100 mmol/L — ABNORMAL LOW (ref 101–111)
GFR calc non Af Amer: >60 mL/min (ref >60)
Glucose, Bld: 97 mg/dL (ref 65–99)
Potassium: 3.7 mmol/L (ref 3.5–5.1)
SODIUM: 137 mmol/L (ref 135–145)

## 2016-09-25 ENCOUNTER — Telehealth: Payer: Self-pay | Admitting: Internal Medicine

## 2016-09-25 ENCOUNTER — Encounter: Payer: Self-pay | Admitting: Internal Medicine

## 2016-09-25 ENCOUNTER — Ambulatory Visit (INDEPENDENT_AMBULATORY_CARE_PROVIDER_SITE_OTHER): Payer: Medicare Other | Admitting: Internal Medicine

## 2016-09-25 VITALS — BP 126/62 | HR 96 | Ht <= 58 in | Wt 155.0 lb

## 2016-09-25 DIAGNOSIS — J9601 Acute respiratory failure with hypoxia: Secondary | ICD-10-CM

## 2016-09-25 DIAGNOSIS — J9602 Acute respiratory failure with hypercapnia: Secondary | ICD-10-CM

## 2016-09-25 DIAGNOSIS — J449 Chronic obstructive pulmonary disease, unspecified: Secondary | ICD-10-CM

## 2016-09-25 DIAGNOSIS — I2781 Cor pulmonale (chronic): Secondary | ICD-10-CM | POA: Diagnosis not present

## 2016-09-25 DIAGNOSIS — J9611 Chronic respiratory failure with hypoxia: Secondary | ICD-10-CM

## 2016-09-25 LAB — PULMONARY FUNCTION TEST
DL/VA % pred: 62 %
DL/VA: 2.46 ml/min/mmHg/L
DLCO UNC: 9.52 ml/min/mmHg
DLCO cor % pred: 52 %
DLCO cor: 8.49 ml/min/mmHg
DLCO unc % pred: 58 %
FEF 25-75 POST: 0.47 L/s
FEF 25-75 Pre: 0.64 L/sec
FEF2575-%Change-Post: -26 %
FEF2575-%Pred-Post: 33 %
FEF2575-%Pred-Pre: 45 %
FEV1-%CHANGE-POST: -4 %
FEV1-%PRED-POST: 71 %
FEV1-%Pred-Pre: 74 %
FEV1-POST: 1.16 L
FEV1-PRE: 1.21 L
FEV1FVC-%Change-Post: 0 %
FEV1FVC-%PRED-PRE: 84 %
FEV6-%CHANGE-POST: -7 %
FEV6-%PRED-POST: 83 %
FEV6-%Pred-Pre: 90 %
FEV6-POST: 1.73 L
FEV6-Pre: 1.86 L
FEV6FVC-%CHANGE-POST: -1 %
FEV6FVC-%PRED-POST: 101 %
FEV6FVC-%Pred-Pre: 103 %
FVC-%CHANGE-POST: -5 %
FVC-%Pred-Post: 82 %
FVC-%Pred-Pre: 87 %
FVC-Post: 1.8 L
FVC-Pre: 1.91 L
PRE FEV6/FVC RATIO: 97 %
Post FEV1/FVC ratio: 64 %
Post FEV6/FVC ratio: 96 %
Pre FEV1/FVC ratio: 64 %
RV % PRED: 121 %
RV: 2.36 L
TLC % PRED: 106 %
TLC: 4.4 L

## 2016-09-25 MED ORDER — UMECLIDINIUM-VILANTEROL 62.5-25 MCG/INH IN AEPB: INHALATION_SPRAY | RESPIRATORY_TRACT | 11 refills | Status: DC

## 2016-09-25 MED ORDER — UMECLIDINIUM-VILANTEROL 62.5-25 MCG/INH IN AEPB: 1.0000 | INHALATION_SPRAY | Freq: Every day | RESPIRATORY_TRACT | 0 refills | Status: DC

## 2016-09-25 NOTE — Assessment & Plan Note (Signed)
Done on dopamine 55mcg/kg/min on 07/26/16   Ao = 110/47 (72) LV = 120/10 RA = 5 RV = 47/7 PA = 42/16 (27) PCW = 6 Fick cardiac output/index = 5.7/3.4 PVR = 4.2 WU SVR 945 Ao sat =  88% PA sat = 71%, 69% High SVC sat = 69%   rx is to keep 02 > 90% at all times and leave off cigs permanently, reviewed

## 2016-09-25 NOTE — Patient Instructions (Addendum)
When you walk keep saturations above 90%  - same goal when sitting.  Plan A = Automatic =  Anoro one click each am  - 2 good drags   Plan B = Backup Only use your albuterol as a rescue medication to be used if you can't catch your breath by resting or doing a relaxed purse lip breathing pattern.  - The less you use it, the better it will work when you need it. - Ok to use the inhaler up to every 4 hours if you must but call for appointment if use goes up over your usual need  Please see patient coordinator before you leave today  to schedule ambulatory 02 concentrator eligbility   Please schedule a follow up visit in 3 months but call sooner if needed

## 2016-09-25 NOTE — Telephone Encounter (Signed)
Spoke with pt, requesting a CT chest.   I advised pt that she was evaluated today and per MW's office visit he did not indicate that she needed a CT.  Pt states that she is requesting one because several family members have had CT scans and was advised by her family to have one.  Pt is requesting that we double-check with MW to ensure that pt doesn't need a CT chest.  MW please advise.  Thanks!   8/13 AVS.   Patient Instructions   When you walk keep saturations above 90%  - same goal when sitting.   Plan A = Automatic =  Anoro one click each am  - 2 good drags    Plan B = Backup Only use your albuterol as a rescue medication to be used if you can't catch your breath by resting or doing a relaxed purse lip breathing pattern.  - The less you use it, the better it will work when you need it. - Ok to use the inhaler up to every 4 hours if you must but call for appointment if use goes up over your usual need   Please see patient coordinator before you leave today  to schedule ambulatory 02 concentrator eligbility    Please schedule a follow up visit in 3 months but call sooner if needed

## 2016-09-25 NOTE — Telephone Encounter (Signed)
Spoke with pt,aware of recs.  Nothing further needed.  

## 2016-09-25 NOTE — Telephone Encounter (Signed)
No CT indicated for now but can discuss low dose screening study eligibility  at f/u ov but way too soon clinically to be concerned about this a she just got turned around off cigs

## 2016-09-25 NOTE — Progress Notes (Signed)
PFT done today. 

## 2016-09-25 NOTE — Assessment & Plan Note (Addendum)
-   PFTs 04/14/2013  FEV1  1.03 (59%) ratio 69 and no better with dlco 66%  - PFT's  09/25/2016  FEV1 1.16  (71 % ) ratio 64  p no % improvement from saba p neb w/in 4 h  prior to study with DLCO  58/52  % corrects to 62 % for alv volume   - 09/25/2016  After extensive coaching  Device  effectiveness =    90% vs 0 % baseline (blew out first) on elipta device so rechallenge with anoro each am    Now that she's not smoking Pt is Group B in terms of symptom/risk and laba/lama therefore appropriate rx at this point unless starts having aecopd but needs to understand the difference between maint and prns and how to use each effectively    I reviewed the Fletcher curve with the patient that basically indicates  if you quit smoking when your best day FEV1 is still   preserved (as is only relatively  the case here)  it is highly unlikely you will progress to severe disease and informed the patient there was  no medication on the market that has proven to alter the curve/ its downward trajectory  or the likelihood of progression of their disease(unlike other chronic medical conditions such as atheroclerosis where we do think we can change the natural hx with risk reducing meds)    Therefore stopping smoking and maintaining abstinence is the most important aspect of care, not choice of inhalers or for that matter, doctors.   I had an extended discussion with the patient reviewing all relevant studies completed to date and  lasting 25 minutes of a 40  minute office visit to re-restablish with me re copd with recent admit/ complicated by [place] and new onset resp failure (see separate a/p)    Each maintenance medication was reviewed in detail including most importantly the difference between maintenance and prns and under what circumstances the prns are to be triggered using an action plan format that is not reflected in the computer generated alphabetically organized AVS.    Please see AVS for specific instructions unique  to this office visit that I personally wrote and verbalized to the the pt in detail and then reviewed with pt  by my nurse highlighting any changes in therapy/plan of care  recommended at today's visit.

## 2016-09-25 NOTE — Progress Notes (Signed)
Subjective:   Patient ID: Deanna Johnston, female    DOB: [birth year]   MRN: [id number]    Brief patient profile:  80  yowf active smoker with tendency to rhinitis/cough x adulthood better since around 2000 on one tsp honey per day then cough started early Dec 2014 and persisted so referred 03/11/2013 by Deanna Johnston proved to have probable acei cough and unrelated  COPD GOLD II documented 04/14/2013     History of Present Illness    03/11/2013 1st  Pulmonary office visit/ Deanna Johnston on ACEi cc persistent cough x 8 weeks she attributes to getting the pneumonia booster more day > night mostly  non-productive  And assoc with sense of pnds / throat tickle.  Does not wake her from sleep and not aware of it until after stirs in am. Already tried abx and inhalers not helping rec The key is to stop smoking completely before smoking completely stops you - this is the most important aspect of your care Stop lisinopril one daily  Start diovan 80 mg one daily      04/14/2013 f/u ov/Deanna Johnston re: GOLD II COPD/ still smoking Cc cough now mostly at hs now whereas was more all day before/ somewhat rattling features, but no excess or purulent sputum production. Not limited by breathing from desired activities  - no need for saba   rec You have GOLD II COPD, the only way to keep it from progressing is to stop smoking now. If the cough starts bothering you again: Pantoprazole (protonix) 40 mg   Take 30-60 min before first meal of the day and Pepcid 20 mg one bedtime until return to office - this is the best way to tell whether stomach acid is contributing to your problem.   GERD diet Please schedule a follow up office visit in 4 weeks, sooner if needed > did not return as rec    Admit date: 07/20/2016 Discharge date: 07/29/2016  Discharge Diagnoses:  Principal Problem:   Acute respiratory failure with hypoxia and hypercapnia (HCC) Active Problems:   Acute on chronic heart failure (HCC)   Cardiogenic shock  (HCC)   Tobacco dependence   Hyperlipidemia   Essential hypertension, benign   Pressure injury of skin   Hypotension   Right ventricular failure (HCC)   PAH (pulmonary artery hypertension) ([place])   Hypoxia   History of present illness:  Per Dr. Corrie Johnston 71 year old female with PMH of COPD (Active smoker, 40-50 year 2 ppd, reports cut back to 1/2 ppd), CAD s/p stents in 2011, HLD, MI. Reports at baseline is SOB with chronic cough.   Presents to ED on 6/7 with progressive dyspnea for the last 2 weeks with increased swelling to BLE. Upon arrival to ED BNP 1113.9, WBC 11.6, Lactic 1.45, ABG 7.3/60/63. Hypotensive with systolic 78-67, oxygenation 90s on 4L [place], received 500 ml bolus and Rocephin/Azithromycin. Cardiology was consulted who recommended diuresis. PCCM asked to admit.   Hospital Course:  #1 acute on chronic hypoxic/hypercarbic respiratory failure Concern for possible acute exacerbation of COPD and acute on chronic right ventricular heart failure. Patient was on the critical care service initially required BiPAP and subsequently weaned down to high flow nasal 6 L. Patient with clinical improvement. It was felt by critical care medicine the patient was likely hypoxemic for years and will likely need home O2 upon discharge. BiPAP was subsequently discontinued. Patient will was also diuresed with IV diuretics and transitioned to oral Lasix per cardiology. Patient was -  10.8 L during this hospitalization. Patient status post antibiotics. Patient also placed on DuoNeb's per pulmonary recommendations and will be discharged on DuoNeb's. Patient improved clinically. Oxygen on room air and ambulation with checked and patient was noted to have sats of 85% on room air. Patient be discharged home on oxygen 3 L nasal cannula. Patient is to follow-up with pulmonary and cardiology in the outpatient setting.  #2 shock secondary to right ventricular failure likely secondary to cor pulmonale  andobstructive sleep apnea 2-D echo 07/21/2016 with a EF of 65-70% with grade 1 diastolic dysfunction, moderate to severely reduced right ventricular systolic function. Dopamine which was initially started due to shock was subsequently discontinued as patient's blood pressure improved. Patient status post right and left heart catheterization 07/26/2016 with EF of 65%, 60% distal circumflex lesion, 20% proximal LAD to mid LAD lesion, 30% distal RCA lesion, 20% stenosed mid RCA lesion. Patient subsequently underwent cardiac MRI. Is felt per pulmonary and cardiology that patient would likely need home O2 and outpatient sleep study. Patient's blood pressure stabilized after dopamine was discontinued. Patient's antihypertensive medications were held due to borderline blood pressure was not resumed on discharge. Patient was discharged on oral Lasix 40 mg daily and is to follow-up with cardiology and pulmonary outpatient setting. Patient will be set up by pulmonary for sleep study.   #3 hyperlipidemia Patient was placed on a statin during the hospitalization be discharged on a statin.  #4 probable obstructive sleep apnea Patient will need outpatient sleep study. Pulmonary to set up as an outpatient.  #6 acute kidney injury Resolved.  #7 coronary artery disease Stable. Cardiac medications of metoprolol and ARB were held due to borderline blood pressure and not resumed on discharge. Patient will follow-up with cardiology in the outpatient setting.     NP ov  08/17/16  Begin Anoro 1 puff daily . Rinse after use> stopped after one week   [place] job not smoking . Keep up good work . [employer]  Decrease Oxygen 2l/m .  Order for POC oxygen device.  Go for sleep study as planned.     09/25/2016  Extended f/u ov/Deanna Johnston re: re-establish for copd management after failing to f/u in 04/2013 as above  Chief Complaint  Patient presents with  . Follow-up    review pft.  pt states she is doing well, denies any current  breathing complaints.    thoroughly confused between maint and prn, not clear did better on anoro as never changed saba to prn  Doe = MMRC2 = can't walk a nl pace on a flat grade s sob but does fine slow and flat eg [employer] shopping on 2lpm does not titrate (has meter, doesn't use with activity)  No obvious day to day or daytime variability or assoc excess/ purulent sputum or mucus plugs or hemoptysis or cp or chest tightness, subjective wheeze or overt sinus or hb symptoms. No unusual exp hx or h/o childhood pna/ asthma or knowledge of premature birth.  Sleeping ok without nocturnal  or early am exacerbation  of respiratory  c/o's or need for noct saba. Also denies any obvious fluctuation of symptoms with weather or environmental changes or other aggravating or alleviating factors except as outlined above   Current Medications, Allergies, Complete Past Medical History, Past Surgical History, Family History, and Social History were reviewed in [employer] record.  ROS  The following are not active complaints unless bolded sore throat, dysphagia, dental problems, itching, sneezing,  nasal congestion or  excess/ purulent secretions, ear ache,   fever, chills, sweats, unintended wt loss, classically pleuritic or exertional cp,  orthopnea pnd or leg swelling, presyncope, palpitations, abdominal pain, anorexia, nausea, vomiting, diarrhea  or change in bowel or bladder habits, change in stools or urine, dysuria,hematuria,  rash, arthralgias, visual complaints, headache, numbness, weakness or ataxia or problems with walking or coordination,  change in mood/affect or memory.                  Objective:   Physical Exam  amb chronically ill  wf nad    04/14/2013     153 > 09/25/2016   155    03/11/13 153 lb 12.8 oz (69.763 kg)  09/04/12 159 lb (72.122 kg)  08/02/12 158 lb (71.668 kg)     Vital signs reviewed  - Note on arrival 02 sats  92% on RA   HEENT: nl dentition,  turbinates bilaterally, and oropharynx. Nl external ear canals without cough reflex   NECK :  without JVD/Nodes/TM/ nl carotid upstrokes bilaterally   LUNGS: no acc muscle use,  slt barrel contour, distant  bs bilaterally  but no wheeze w/in 4h of neb    CV:  RRR  no s3 or murmur or increase in P2, and no edema   ABD:  soft and nontender with nl inspiratory excursion in the supine position. No bruits or organomegaly appreciated, bowel sounds nl  MS:  Nl gait/ ext warm without deformities, calf tenderness, cyanosis or clubbing No obvious joint restrictions   SKIN: warm and dry without lesions    NEURO:  alert, approp, nl sensorium with  no motor or cerebellar deficits apparent.           I personally reviewed images and agree with radiology impression as follows:  CXR:   07/20/16 1. Mild cardiomegaly.  No pulmonary venous congestion. 2. Mild bibasilar subsegmental atelectasis and/or scarring. Small bilateral pleural effusions versus scarring .     Assessment & Plan:

## 2016-09-25 NOTE — Assessment & Plan Note (Signed)
HC03  09/21/16  =  29 (improved from peak of 36 one m prior)  -  09/25/2016   Room  Air at Rest = 88% Patient Saturations on Room Air while Ambulating = 87% Patient Saturations on 2L Liters of oxygen while Ambulating = 93%  09/25/2016 rec 2lpm sleeping, titrate with walking, ok to leave off at rest only if sats > 90%

## 2016-09-26 ENCOUNTER — Telehealth (HOSPITAL_COMMUNITY): Payer: Self-pay | Admitting: *Deleted

## 2016-09-26 NOTE — Telephone Encounter (Signed)
Patient called and stated she had gained 2 lbs overnight but asymptomatic.  Said she had a big lunch yesterday with watermelon.  I advised her to watch her diet and to call us back tomorrow if continues to gain weight.  She understands and no further questions.

## 2016-09-27 ENCOUNTER — Other Ambulatory Visit (HOSPITAL_COMMUNITY): Payer: Self-pay | Admitting: Cardiology

## 2016-09-27 MED ORDER — FUROSEMIDE 40 MG PO TABS: 40.0000 mg | ORAL_TABLET | Freq: Every day | ORAL | 3 refills | Status: DC

## 2016-10-02 ENCOUNTER — Telehealth: Payer: Self-pay | Admitting: Internal Medicine

## 2016-10-02 ENCOUNTER — Other Ambulatory Visit (HOSPITAL_COMMUNITY): Payer: Self-pay

## 2016-10-02 DIAGNOSIS — I50810 Right heart failure, unspecified: Secondary | ICD-10-CM

## 2016-10-02 NOTE — Telephone Encounter (Signed)
I have not restricted her to continuous > my goal was as stated to keep her over 90% and as long as whatever device she uses accomplishes these goals that's fine

## 2016-10-02 NOTE — Telephone Encounter (Signed)
Called and spoke with pt. Pt states she did qualify for POC with [facility] at 2L pulse. Pt states her POC will do both cont and pulse dose. Pt is wanting to clarify that MW has not restricted her to 2L cont flow only.  MW please advise. Thanks.      09/25/16 AVS When you walk keep saturations above 90%  - same goal when sitting.  Plan A = Automatic =  Anoro one click each am  - 2 good drags   Plan B = Backup Only use your albuterol as a rescue medication to be used if you can't catch your breath by resting or doing a relaxed purse lip breathing pattern.  - The less you use it, the better it will work when you need it. - Ok to use the inhaler up to every 4 hours if you must but call for appointment if use goes up over your usual need  Please see patient coordinator before you leave today  to schedule ambulatory 02 concentrator eligbility   Please schedule a follow up visit in 3 months but call sooner if needed

## 2016-10-02 NOTE — Telephone Encounter (Signed)
Spoke with pt. She is aware of MW's response. Nothing further was needed. 

## 2016-10-04 ENCOUNTER — Other Ambulatory Visit: Payer: Self-pay

## 2016-10-05 MED ORDER — POTASSIUM CHLORIDE CRYS ER 20 MEQ PO TBCR: 20.0000 meq | EXTENDED_RELEASE_TABLET | Freq: Every day | ORAL | 3 refills | Status: DC

## 2016-10-09 ENCOUNTER — Ambulatory Visit (HOSPITAL_BASED_OUTPATIENT_CLINIC_OR_DEPARTMENT_OTHER): Payer: Medicare Other | Attending: Internal Medicine | Admitting: Cardiology

## 2016-10-09 VITALS — Ht <= 58 in | Wt 154.0 lb

## 2016-10-09 DIAGNOSIS — G478 Other sleep disorders: Secondary | ICD-10-CM | POA: Insufficient documentation

## 2016-10-09 DIAGNOSIS — I509 Heart failure, unspecified: Secondary | ICD-10-CM | POA: Insufficient documentation

## 2016-10-09 DIAGNOSIS — I50810 Right heart failure, unspecified: Secondary | ICD-10-CM

## 2016-10-09 DIAGNOSIS — I493 Ventricular premature depolarization: Secondary | ICD-10-CM | POA: Diagnosis not present

## 2016-10-09 DIAGNOSIS — R0683 Snoring: Secondary | ICD-10-CM

## 2016-10-17 ENCOUNTER — Encounter (HOSPITAL_COMMUNITY)
Admission: RE | Admit: 2016-10-17 | Discharge: 2016-10-17 | Disposition: A | Discharge status: Home / Self Care | Payer: Self-pay | Source: Ambulatory Visit | Attending: Internal Medicine | Admitting: Internal Medicine

## 2016-10-17 DIAGNOSIS — I50810 Right heart failure, unspecified: Secondary | ICD-10-CM | POA: Insufficient documentation

## 2016-10-17 NOTE — Procedures (Addendum)
   Patient Name: Deanna Johnston, Deanna Johnston Date: 10/09/2016 Gender: Female D.O.B: Dec 27, 1945 Age (years): 52 Referring Provider: Shaune Pascal Bensimhon Height (inches): 72 Interpreting Physician: Fransico Him MD, ABSM Weight (lbs): 154 RPSGT: Carolin Coy BMI: 32 MRN: [id number] Neck Size: 15.50  CLINICAL INFORMATION Sleep Study Type: NPSG  Indication for sleep study: Congestive Heart Failure, Obesity, Snoring  Epworth Sleepiness Score: 3  SLEEP STUDY TECHNIQUE As per the AASM Manual for the Scoring of Sleep and Associated Events v2.3 (April 2016) with a hypopnea requiring 4% desaturations.  The channels recorded and monitored were frontal, central and occipital EEG, electrooculogram (EOG), submentalis EMG (chin), nasal and oral airflow, thoracic and abdominal wall motion, anterior tibialis EMG, snore microphone, electrocardiogram, and pulse oximetry.  MEDICATIONS Medications self-administered by patient taken the night of the study : N/A  SLEEP ARCHITECTURE The study was initiated at 11:01:29 PM and ended at 5:38:41 AM.  Sleep onset time was 27.2 minutes and the sleep efficiency was 62.9%. The total sleep time was 250.0 minutes.  Stage REM latency was 173.0 minutes.  The patient spent 18.00% of the night in stage N1 sleep, 65.00% in stage N2 sleep, 0.00% in stage N3 and 17.00% in REM.  Alpha intrusion was absent.  Supine sleep was 0.00%.  RESPIRATORY PARAMETERS The overall apnea/hypopnea index (AHI) was 3.6 per hour. There were 15 total apneas, including 15 obstructive, 0 central and 0 mixed apneas. There were 0 hypopneas and 123 RERAs.  The AHI during Stage REM sleep was 19.8 per hour.  AHI while supine was N/A per hour.  The RDI was 33/hr.  The mean oxygen saturation was 97.06%. The minimum SpO2 during sleep was 94.00%.  Moderate snoring was noted during this study.  CARDIAC DATA The 2 lead EKG demonstrated sinus rhythm. The mean heart rate was 65.49 beats per  minute. Other EKG findings include: PVCs.  LEG MOVEMENT DATA The total PLMS were 24 with a resulting PLMS index of 5.76. Associated arousal with leg movement index was 0.2 .  IMPRESSIONS - No significant obstructive sleep apnea occurred during this study (AHI = 3.6/h). - Upper airway resistance syndrome is note with RDI 33 per hour. - No significant central sleep apnea occurred during this study (CAI = 0.0/h). - The patient had minimal or no oxygen desaturation during the study (Min O2 = 94.00%) - The patient snored with Moderate snoring volume. - EKG findings include PVCs. - Mild periodic limb movements of sleep occurred during the study. No significant associated arousals.  DIAGNOSIS - Upper airway resistance syndrome with RDI 33 per hour - No OSA noted - PVCs  RECOMMENDATIONS - Avoid alcohol, sedatives and other CNS depressants that may worsen sleep apnea and disrupt normal sleep architecture. - Sleep hygiene should be reviewed to assess factors that may improve sleep quality. - Weight management and regular exercise should be initiated or continued if appropriate. - Patient has evidence of upper airway resistance syndrome but in the setting of normal Epworth sleepiness score and no significant oxygen desaturations, she would not qualify for CPAP therapy at this time.     [place], American Board of Sleep Medicine  ELECTRONICALLY SIGNED ON:  10/17/2016, 7:47 PM [place] PH: [phone]   FX: [phone] [place]

## 2016-10-18 NOTE — Progress Notes (Signed)
Deanna Johnston started the Pulmonary Maintenance Program 10/27/16.She tolerated exercise well. Her only complaint was of some back pain while she was on the recumbent bike.This is a chronic problem for her.

## 2016-10-19 ENCOUNTER — Encounter (HOSPITAL_COMMUNITY)
Admission: RE | Admit: 2016-10-19 | Discharge: 2016-10-19 | Disposition: A | Discharge status: Home / Self Care | Payer: Self-pay | Source: Ambulatory Visit | Attending: Internal Medicine | Admitting: Internal Medicine

## 2016-10-24 ENCOUNTER — Telehealth: Payer: Self-pay | Admitting: *Deleted

## 2016-10-24 ENCOUNTER — Encounter (HOSPITAL_COMMUNITY)
Admission: RE | Admit: 2016-10-24 | Discharge: 2016-10-24 | Disposition: A | Discharge status: Home / Self Care | Payer: Self-pay | Source: Ambulatory Visit | Attending: Internal Medicine | Admitting: Internal Medicine

## 2016-10-24 NOTE — Telephone Encounter (Signed)
-----   Message from Sueanne Margarita, MD sent at 10/17/2016  7:49 PM EDT ----- Please let patient know that sleep study showed no significant sleep apnea.

## 2016-10-24 NOTE — Telephone Encounter (Signed)
Informed patient of sleep study results and patient understanding was verbalized. Patient was very pleased to hear her result. Patient understands there are no further appointments scheduled for her at this time. Patient was grateful for the call and thanked me.

## 2016-10-26 ENCOUNTER — Encounter (HOSPITAL_COMMUNITY)
Admission: RE | Admit: 2016-10-26 | Discharge: 2016-10-26 | Disposition: A | Discharge status: Home / Self Care | Payer: Self-pay | Source: Ambulatory Visit | Attending: Internal Medicine | Admitting: Internal Medicine

## 2016-10-30 ENCOUNTER — Ambulatory Visit (INDEPENDENT_AMBULATORY_CARE_PROVIDER_SITE_OTHER): Payer: Medicare Other | Admitting: Orthopaedic Surgery

## 2016-10-30 DIAGNOSIS — M7022 Olecranon bursitis, left elbow: Secondary | ICD-10-CM | POA: Diagnosis not present

## 2016-10-30 NOTE — Progress Notes (Signed)
The patient is here with chronic left elbow olecranon bursitis. We've aspirated this area before. She wanted it evaluated again today.  On examination there is small amount of fluid over the olecranon bursa and I could not aspirate much fluid from it at all. I did this with an 18-gauge needle. There is no evidence infection. Her elbow functions otherwise normal. I did not again get much fluid out from the elbow and a try to place a steroid injection around olecranon bursa but most of the steroid came back out through the 18-gauge needle hole. I did place a compressive dressing around this. I'm not charge her for a steroid injection since most of this came out. I explained the risk and benefits of wine disease injection and how the still may help her. All questions were encouraged and answered. She'll follow-up as needed.

## 2016-10-31 ENCOUNTER — Encounter (HOSPITAL_COMMUNITY)
Admission: RE | Admit: 2016-10-31 | Discharge: 2016-10-31 | Disposition: A | Discharge status: Home / Self Care | Payer: Self-pay | Source: Ambulatory Visit | Attending: Internal Medicine | Admitting: Internal Medicine

## 2016-11-02 ENCOUNTER — Encounter (HOSPITAL_COMMUNITY)
Admission: RE | Admit: 2016-11-02 | Discharge: 2016-11-02 | Disposition: A | Discharge status: Home / Self Care | Payer: Self-pay | Source: Ambulatory Visit | Attending: Internal Medicine | Admitting: Internal Medicine

## 2016-11-07 ENCOUNTER — Encounter (HOSPITAL_COMMUNITY)
Admission: RE | Admit: 2016-11-07 | Discharge: 2016-11-07 | Disposition: A | Discharge status: Home / Self Care | Payer: Self-pay | Source: Ambulatory Visit | Attending: Internal Medicine | Admitting: Internal Medicine

## 2016-11-09 ENCOUNTER — Encounter (HOSPITAL_COMMUNITY)
Admission: RE | Admit: 2016-11-09 | Discharge: 2016-11-09 | Disposition: A | Discharge status: Home / Self Care | Payer: Self-pay | Source: Ambulatory Visit | Attending: Internal Medicine | Admitting: Internal Medicine

## 2016-11-14 ENCOUNTER — Encounter (HOSPITAL_COMMUNITY)
Admission: RE | Admit: 2016-11-14 | Discharge: 2016-11-14 | Disposition: A | Discharge status: Home / Self Care | Payer: Self-pay | Source: Ambulatory Visit | Attending: Internal Medicine | Admitting: Internal Medicine

## 2016-11-14 DIAGNOSIS — I50810 Right heart failure, unspecified: Secondary | ICD-10-CM | POA: Insufficient documentation

## 2016-11-16 ENCOUNTER — Encounter (HOSPITAL_COMMUNITY)
Admission: RE | Admit: 2016-11-16 | Discharge: 2016-11-16 | Disposition: A | Discharge status: Home / Self Care | Payer: Self-pay | Source: Ambulatory Visit | Attending: Internal Medicine | Admitting: Internal Medicine

## 2016-11-17 ENCOUNTER — Ambulatory Visit (HOSPITAL_COMMUNITY)
Admission: RE | Admit: 2016-11-17 | Discharge: 2016-11-17 | Disposition: A | Discharge status: Home / Self Care | Payer: Medicare Other | Source: Ambulatory Visit | Attending: Internal Medicine | Admitting: Internal Medicine

## 2016-11-17 ENCOUNTER — Encounter (HOSPITAL_COMMUNITY): Payer: Self-pay | Admitting: Internal Medicine

## 2016-11-17 ENCOUNTER — Ambulatory Visit (HOSPITAL_BASED_OUTPATIENT_CLINIC_OR_DEPARTMENT_OTHER)
Admission: RE | Admit: 2016-11-17 | Discharge: 2016-11-17 | Disposition: A | Discharge status: Home / Self Care | Payer: Medicare Other | Source: Ambulatory Visit

## 2016-11-17 VITALS — BP 126/51 | HR 76 | Wt 157.0 lb

## 2016-11-17 DIAGNOSIS — I50810 Right heart failure, unspecified: Secondary | ICD-10-CM | POA: Diagnosis not present

## 2016-11-17 DIAGNOSIS — I5032 Chronic diastolic (congestive) heart failure: Secondary | ICD-10-CM

## 2016-11-17 DIAGNOSIS — Z87891 Personal history of nicotine dependence: Secondary | ICD-10-CM | POA: Diagnosis not present

## 2016-11-17 DIAGNOSIS — E785 Hyperlipidemia, unspecified: Secondary | ICD-10-CM | POA: Insufficient documentation

## 2016-11-17 DIAGNOSIS — Z6832 Body mass index (BMI) 32.0-32.9, adult: Secondary | ICD-10-CM | POA: Insufficient documentation

## 2016-11-17 DIAGNOSIS — Z87410 Personal history of cervical dysplasia: Secondary | ICD-10-CM | POA: Insufficient documentation

## 2016-11-17 DIAGNOSIS — I509 Heart failure, unspecified: Secondary | ICD-10-CM | POA: Diagnosis present

## 2016-11-17 DIAGNOSIS — Z9981 Dependence on supplemental oxygen: Secondary | ICD-10-CM | POA: Diagnosis not present

## 2016-11-17 DIAGNOSIS — M7022 Olecranon bursitis, left elbow: Secondary | ICD-10-CM | POA: Diagnosis not present

## 2016-11-17 DIAGNOSIS — E669 Obesity, unspecified: Secondary | ICD-10-CM | POA: Insufficient documentation

## 2016-11-17 DIAGNOSIS — Z79899 Other long term (current) drug therapy: Secondary | ICD-10-CM | POA: Diagnosis not present

## 2016-11-17 DIAGNOSIS — Z833 Family history of diabetes mellitus: Secondary | ICD-10-CM | POA: Diagnosis not present

## 2016-11-17 DIAGNOSIS — J9611 Chronic respiratory failure with hypoxia: Secondary | ICD-10-CM | POA: Diagnosis not present

## 2016-11-17 DIAGNOSIS — I959 Hypotension, unspecified: Secondary | ICD-10-CM | POA: Diagnosis not present

## 2016-11-17 DIAGNOSIS — I252 Old myocardial infarction: Secondary | ICD-10-CM | POA: Diagnosis not present

## 2016-11-17 DIAGNOSIS — Z7982 Long term (current) use of aspirin: Secondary | ICD-10-CM | POA: Insufficient documentation

## 2016-11-17 DIAGNOSIS — I2510 Atherosclerotic heart disease of native coronary artery without angina pectoris: Secondary | ICD-10-CM | POA: Diagnosis not present

## 2016-11-17 DIAGNOSIS — Z955 Presence of coronary angioplasty implant and graft: Secondary | ICD-10-CM | POA: Insufficient documentation

## 2016-11-17 DIAGNOSIS — Z8249 Family history of ischemic heart disease and other diseases of the circulatory system: Secondary | ICD-10-CM | POA: Diagnosis not present

## 2016-11-17 DIAGNOSIS — Z800 Family history of malignant neoplasm of digestive organs: Secondary | ICD-10-CM | POA: Diagnosis not present

## 2016-11-17 DIAGNOSIS — J449 Chronic obstructive pulmonary disease, unspecified: Secondary | ICD-10-CM | POA: Insufficient documentation

## 2016-11-17 LAB — ECHOCARDIOGRAM COMPLETE
CHL CUP MV DEC (S): 176
E decel time: 176 msec
E/e' ratio: 6.19
FS: 43 % (ref 28–44)
IVS/LV PW RATIO, ED: 0.95
LA diam end sys: 36 mm
LA diam index: 2.21 cm/m2
LA vol A4C: 38.3 ml
LASIZE: 36 mm
LDCA: 3.46 cm2
LV E/e' medial: 6.19
LV PW d: 9.89 mm — AB (ref 0.6–1.1)
LV TDI E'LATERAL: 10.8
LV e' LATERAL: 10.8 cm/s
LVEEAVG: 6.19
LVOT VTI: 20.8 cm
LVOT peak grad rest: 3 mmHg
LVOT peak vel: 82.2 cm/s
LVOTD: 21 mm
LVOTSV: 72 mL
MV pk A vel: 91.3 m/s
MV pk E vel: 66.8 m/s
RV LATERAL S' VELOCITY: 11.6 cm/s
TAPSE: 22.8 mm
TDI e' medial: 7.4

## 2016-11-17 NOTE — Progress Notes (Signed)
  Echocardiogram 2D Echocardiogram has been performed.  Deanna Johnston 11/17/2016, 3:00 PM

## 2016-11-17 NOTE — Patient Instructions (Signed)
Follow up in 6months with Dr.Bensimhon.  

## 2016-11-17 NOTE — Progress Notes (Signed)
HF: Dr Haroldine Laws Pulmonary : Dr Melvyn Novas.   ADVANCED HF CLINIC NOTE  HPI: Deanna Johnston) is a 71 year old female with PMH of COPD with ongoing tobacco use, CAD s/p NSTEMI with RCA stents in 2011 and obesity.  Admitted to [facility] on 07/20/16 with progressive dyspnea and LE edema. Upon arrival to ED BNP 1113.9, WBC 11.6, Lactic 1.45, ABG 7.3/60/63. Hypotensive with systolic 25-85, oxygenation 90s on 4L [place], received 500 ml bolus and Rocephin/Azithromycin. Admitted to CCM.   Today she returns for HF follow up. Overall feeling great. Continues to wear 2 liters of oxygen per day. SOB with exertion. Denies PND/Orthopnea. No fever or chills. Attending pulmonary rehab 2 days a week. Weight at home 152-155 pounds. Taking al medications.   Testing  Echo 07/21/16: LV EF 65-70% with dilated RV and moderate RV HK. Started on dopamine for RV support. CT chest no PE. Diuresed well and renal function improved.   Underwent RHC/LHC on 07/26/16   Mid RCA to Dist RCA lesion, 0 %stenosed.  Mid RCA lesion, 20 %stenosed.  Dist RCA lesion, 30 %stenosed.  Dist Cx lesion, 60 %stenosed.  Prox LAD to Mid LAD lesion, 20 %stenosed.  The left ventricular ejection fraction is greater than 65% by visual estimate. Findings: Done on dopamine 34mcg/kg/min Ao = 110/47 (72) LV = 120/10 RA = 5 RV = 47/7 PA = 42/16 (27) PCW = 6 Fick cardiac output/index = 5.7/3.4 PVR = 4.2 WU SVR 945 Ao sat = 88% PA sat = 71%, 69% High SVC sat = 69% Assessment: 1. Heavily calcified coronary arteries with only minimal luminal CAD. Widely patent RCA stent 2. LVEF 70% without regional wall motion abnormalities 3. Mild PAH with normal cardiac output on low-dose dopamine 4. No intracardiac shunt 4. PAD - central aortic pressure ~48mmHG greater than radial artery pressure or cuff pressure  CMRI done to evaluate RV dysfunction - LVEF 77% No RV infarct. Possible infiltrative process but not classic amyloid process. No RV scar   SPEP UPEP negative.  (cMRI reviewed with Dr. Meda Coffee and not felt to have infiltrative process)  SH:  Social History   Social History  . Marital status: Single    Spouse name: N/A  . Number of children: N/A  . Years of education: N/A   Occupational History  . Retired Retired   Social History Main Topics  . Smoking status: Former Smoker    Packs/day: 0.50    Years: 47.00    Types: Cigarettes    Quit date: 07/20/2016  . Smokeless tobacco: Never Used  . Alcohol use 0.0 oz/week     Comment: Rare  . Drug use: No  . Sexual activity: No     Comment: HYST-1st intercourse 71 yo-More than 5 partners   Other Topics Concern  . Not on file   Social History Narrative  . No narrative on file    FH:  Family History  Problem Relation Age of Onset  . Heart failure Mother   . Stomach cancer Mother   . Heart disease Father   . Diabetes Father   . Colon cancer Sister   . Heart disease Brother     Past Medical History:  Diagnosis Date  . Cataract   . Cervical dysplasia   . COPD (chronic obstructive pulmonary disease) ([place])   . Coronary artery disease   . Hyperlipidemia   . MI (myocardial infarction) [facility]) Dec. 2011   stent mid right coronary  . Obesity   .  Tobacco dependence   . VAIN (vaginal intraepithelial neoplasia) 2011   Efudex treatment    Current Outpatient Prescriptions  Medication Sig Dispense Refill  . aspirin EC 81 MG tablet Take 81 mg by mouth daily.    . furosemide (LASIX) 40 MG tablet Take 1 tablet (40 mg total) by mouth daily. 30 tablet 3  . levothyroxine (SYNTHROID) 50 MCG tablet Take 1 tablet (50 mcg total) by mouth daily before breakfast.    . nitroGLYCERIN (NITROSTAT) 0.4 MG SL tablet PLACE 1 TABLET UNDER THE TONGUE EVERY 5 MINUTES AS NEEDED FOR CHEST PAIN 75 tablet 0  . potassium chloride SA (K-DUR,KLOR-CON) 20 MEQ tablet Take 1 tablet (20 mEq total) by mouth daily. 90 tablet 3  . umeclidinium-vilanterol (ANORO ELLIPTA) 62.5-25 MCG/INH AEPB Only open the  device one time and then take your two separate drags to be sure you get it all 1 each 11  . umeclidinium-vilanterol (ANORO ELLIPTA) 62.5-25 MCG/INH AEPB Inhale 1 puff into the lungs daily. 2 each 0   No current facility-administered medications for this encounter.     Vitals:   11/17/16 1510  BP: (!) 126/51  Pulse: 76  SpO2: 98%  Weight: 157 lb (71.2 kg)    PHYSICAL EXAM: General:  Well appearing. No resp difficulty HEENT: normal Neck: supple. no JVD. Carotids 2+ bilat; no bruits. No lymphadenopathy or thryomegaly appreciated. Cor: PMI nondisplaced. Regular rate & rhythm. No rubs, gallops or murmurs. Lungs: clear on 2 liters oxygen.  Abdomen: soft, nontender, nondistended. No hepatosplenomegaly. No bruits or masses. Good bowel sounds. Extremities: no cyanosis, clubbing, rash, edema. R and LLE with ted hose.  Neuro: alert & orientedx3, cranial nerves grossly intact. moves all 4 extremities w/o difficulty. Affect pleasant   ASSESSMENT & PLAN:  1. RV failure - Suspect due to COPD and untreated OSA - No evidence of RV infarct or infiltrative process on cMRI - ECHO today discussed and reviewed. RV now normalized per Dr Haroldine Laws.  - Off spiro due to hyperkalemia.   - Continue supplemental O2. -Continue pulmonary rehab.   - 2. CAD - s/p inferior MI 2011 with RCA stents - cath with non-obstructive CAD in 6/18 (LAD 20%, dLCX 60%, RCA 30%) - No S/S  ischemia.  - Continue ASA/statin  3. Hypotension - Invasive BP was ~25 points higher than cuff or radial BP.   4. Tobacco use and chronic hypoxic respiratory failure -Quit in June 2018.  - Continue O2. Follow up with Pulmonary - Continue Pulmonary Rehab  - 5. Left elbow olecranon bursitis- Followed by Dr Ninfa Linden  Follow up in 6 months.   Darrick Grinder, NP  3:14 PM   Patient seen and examined with Darrick Grinder, NP. We discussed all aspects of the encounter. I agree with the assessment and plan as stated above.   Echo reviewed  personally with her and her daughter in clinic today. LV function remains normal. RV now improved with normal size and function. Volume status looks ok. Continue supplemental O2 and Pulmonary Rehab. CAD is non-obstructive. Continue ASA and statin.   Glori Bickers, MD  7:17 PM

## 2016-11-21 ENCOUNTER — Encounter (HOSPITAL_COMMUNITY)
Admission: RE | Admit: 2016-11-21 | Discharge: 2016-11-21 | Disposition: A | Discharge status: Home / Self Care | Payer: Self-pay | Source: Ambulatory Visit | Attending: Internal Medicine | Admitting: Internal Medicine

## 2016-11-22 NOTE — Telephone Encounter (Signed)
Pt never picked up samples or application forms.  Samples have been removed from front cubby and placed back on cart.  Application forms have been shredded.  Nothing further needed.

## 2016-11-23 ENCOUNTER — Encounter (HOSPITAL_COMMUNITY)
Admission: RE | Admit: 2016-11-23 | Discharge: 2016-11-23 | Disposition: A | Discharge status: Home / Self Care | Payer: Medicare Other | Source: Ambulatory Visit | Attending: Internal Medicine | Admitting: Internal Medicine

## 2016-11-28 ENCOUNTER — Encounter (HOSPITAL_COMMUNITY)
Admission: RE | Admit: 2016-11-28 | Discharge: 2016-11-28 | Disposition: A | Discharge status: Home / Self Care | Payer: Self-pay | Source: Ambulatory Visit | Attending: Internal Medicine | Admitting: Internal Medicine

## 2016-11-30 ENCOUNTER — Encounter (HOSPITAL_COMMUNITY)
Admission: RE | Admit: 2016-11-30 | Discharge: 2016-11-30 | Disposition: A | Discharge status: Home / Self Care | Payer: Self-pay | Source: Ambulatory Visit | Attending: Internal Medicine | Admitting: Internal Medicine

## 2016-12-01 ENCOUNTER — Other Ambulatory Visit: Payer: Self-pay | Admitting: Family Medicine

## 2016-12-01 DIAGNOSIS — Z139 Encounter for screening, unspecified: Secondary | ICD-10-CM

## 2016-12-05 ENCOUNTER — Encounter (HOSPITAL_COMMUNITY)
Admission: RE | Admit: 2016-12-05 | Discharge: 2016-12-05 | Disposition: A | Discharge status: Home / Self Care | Payer: Self-pay | Source: Ambulatory Visit | Attending: Internal Medicine | Admitting: Internal Medicine

## 2016-12-07 ENCOUNTER — Encounter (HOSPITAL_COMMUNITY)
Admission: RE | Admit: 2016-12-07 | Discharge: 2016-12-07 | Disposition: A | Discharge status: Home / Self Care | Payer: Self-pay | Source: Ambulatory Visit | Attending: Internal Medicine | Admitting: Internal Medicine

## 2016-12-12 ENCOUNTER — Encounter (HOSPITAL_COMMUNITY)
Admission: RE | Admit: 2016-12-12 | Discharge: 2016-12-12 | Disposition: A | Discharge status: Home / Self Care | Payer: Self-pay | Source: Ambulatory Visit | Attending: Internal Medicine | Admitting: Internal Medicine

## 2016-12-14 ENCOUNTER — Encounter (HOSPITAL_COMMUNITY)
Admission: RE | Admit: 2016-12-14 | Discharge: 2016-12-14 | Disposition: A | Discharge status: Home / Self Care | Payer: Self-pay | Source: Ambulatory Visit | Attending: Internal Medicine | Admitting: Internal Medicine

## 2016-12-14 DIAGNOSIS — I50810 Right heart failure, unspecified: Secondary | ICD-10-CM | POA: Insufficient documentation

## 2016-12-19 ENCOUNTER — Encounter (HOSPITAL_COMMUNITY)
Admission: RE | Admit: 2016-12-19 | Discharge: 2016-12-19 | Disposition: A | Discharge status: Home / Self Care | Payer: Self-pay | Source: Ambulatory Visit | Attending: Internal Medicine | Admitting: Internal Medicine

## 2016-12-21 ENCOUNTER — Encounter (HOSPITAL_COMMUNITY)
Admission: RE | Admit: 2016-12-21 | Discharge: 2016-12-21 | Disposition: A | Discharge status: Home / Self Care | Payer: Self-pay | Source: Ambulatory Visit | Attending: Internal Medicine | Admitting: Internal Medicine

## 2016-12-26 ENCOUNTER — Encounter: Payer: Self-pay | Admitting: Internal Medicine

## 2016-12-26 ENCOUNTER — Ambulatory Visit: Payer: Medicare Other | Admitting: Internal Medicine

## 2016-12-26 ENCOUNTER — Encounter (HOSPITAL_COMMUNITY)
Admission: RE | Admit: 2016-12-26 | Discharge: 2016-12-26 | Disposition: A | Discharge status: Home / Self Care | Payer: Self-pay | Source: Ambulatory Visit | Attending: Internal Medicine | Admitting: Internal Medicine

## 2016-12-26 VITALS — BP 118/60 | HR 101 | Ht <= 58 in | Wt 160.0 lb

## 2016-12-26 DIAGNOSIS — J449 Chronic obstructive pulmonary disease, unspecified: Secondary | ICD-10-CM

## 2016-12-26 DIAGNOSIS — J9611 Chronic respiratory failure with hypoxia: Secondary | ICD-10-CM | POA: Diagnosis not present

## 2016-12-26 DIAGNOSIS — I2781 Cor pulmonale (chronic): Secondary | ICD-10-CM

## 2016-12-26 MED ORDER — ALBUTEROL SULFATE HFA 108 (90 BASE) MCG/ACT IN AERS: INHALATION_SPRAY | RESPIRATORY_TRACT | 1 refills | Status: AC

## 2016-12-26 NOTE — Assessment & Plan Note (Signed)
HC03  09/21/16  =  29 (improved from peak of 36 one m prior)  -  09/25/2016   Room  Air at Rest = 88% Patient Saturations on Room Air while Ambulating = 87% Patient Saturations on 2L Liters of oxygen while Ambulating = 93%  12/26/2016 rec 2lpm sleeping, titrate with walking, ok to leave off at rest only if sats > 90%

## 2016-12-26 NOTE — Progress Notes (Signed)
Subjective:   Patient ID: Deanna Johnston, female    DOB: [birth year]   MRN: [id number]    Brief patient profile:  44  yowf quit smoking 07/2016  with tendency to rhinitis/cough x adulthood better since around 2000 on one tsp honey per day then cough started early Dec 2014 and persisted so referred 03/11/2013 by Lady Deutscher proved to have probable acei cough and unrelated  COPD GOLD II documented 04/14/2013     History of Present Illness    03/11/2013 1st [facility] office visit/ Deanna Johnston on ACEi cc persistent cough x 8 weeks she attributes to getting the pneumonia booster more day > night mostly  non-productive  And assoc with sense of pnds / throat tickle.  Does not wake her from sleep and not aware of it until after stirs in am. Already tried abx and inhalers not helping rec The key is to stop smoking completely before smoking completely stops you - this is the most important aspect of your care Stop lisinopril one daily  Start diovan 80 mg one daily      04/14/2013 f/u ov/Deanna Johnston re: GOLD II COPD/ still smoking Cc cough now mostly at hs now whereas was more all day before/ somewhat rattling features, but no excess or purulent sputum production. Not limited by breathing from desired activities  - no need for saba   rec You have GOLD II COPD, the only way to keep it from progressing is to stop smoking now. If the cough starts bothering you again: Pantoprazole (protonix) 40 mg   Take 30-60 min before first meal of the day and Pepcid 20 mg one bedtime until return to office - this is the best way to tell whether stomach acid is contributing to your problem.   GERD diet Please schedule a follow up office visit in 4 weeks, sooner if needed > did not return as rec    Admit date: 07/20/2016 Discharge date: 07/29/2016  Discharge Diagnoses:  Principal Problem:   Acute respiratory failure with hypoxia and hypercapnia (HCC) Active Problems:   Acute on chronic heart failure (HCC)   Cardiogenic shock  (HCC)   Tobacco dependence   Hyperlipidemia   Essential hypertension, benign   Pressure injury of skin   Hypotension   Right ventricular failure (HCC)   PAH (pulmonary artery hypertension) ([place])   Hypoxia   History of present illness:  Per Dr. Corrie Dandy 71 year old female with PMH of COPD (Active smoker, 40-50 year 2 ppd, reports cut back to 1/2 ppd), CAD s/p stents in 2011, HLD, MI. Reports at baseline is SOB with chronic cough.   Presents to ED on 6/7 with progressive dyspnea for the last 2 weeks with increased swelling to BLE. Upon arrival to ED BNP 1113.9, WBC 11.6, Lactic 1.45, ABG 7.3/60/63. Hypotensive with systolic 53-97, oxygenation 90s on 4L , received 500 ml bolus and Rocephin/Azithromycin. Cardiology was consulted who recommended diuresis. PCCM asked to admit.   Hospital Course:  #1 acute on chronic hypoxic/hypercarbic respiratory failure Concern for possible acute exacerbation of COPD and acute on chronic right ventricular heart failure. Patient was on the critical care service initially required BiPAP and subsequently weaned down to high flow nasal 6 L. Patient with clinical improvement. It was felt by critical care medicine the patient was likely hypoxemic for years and will likely need home O2 upon discharge. BiPAP was subsequently discontinued. Patient will was also diuresed with IV diuretics and transitioned to oral Lasix per cardiology.  Patient was -10.8 L during this hospitalization. Patient status post antibiotics. Patient also placed on DuoNeb's per pulmonary recommendations and will be discharged on DuoNeb's. Patient improved clinically. Oxygen on room air and ambulation with checked and patient was noted to have sats of 85% on room air. Patient be discharged home on oxygen 3 L nasal cannula. Patient is to follow-up with pulmonary and cardiology in the outpatient setting.  #2 shock secondary to right ventricular failure likely secondary to cor pulmonale  andobstructive sleep apnea 2-D echo 07/21/2016 with a EF of 65-70% with grade 1 diastolic dysfunction, moderate to severely reduced right ventricular systolic function. Dopamine which was initially started due to shock was subsequently discontinued as patient's blood pressure improved. Patient status post right and left heart catheterization 07/26/2016 with EF of 65%, 60% distal circumflex lesion, 20% proximal LAD to mid LAD lesion, 30% distal RCA lesion, 20% stenosed mid RCA lesion. Patient subsequently underwent cardiac MRI. Is felt per pulmonary and cardiology that patient would likely need home O2 and outpatient sleep study. Patient's blood pressure stabilized after dopamine was discontinued. Patient's antihypertensive medications were held due to borderline blood pressure was not resumed on discharge. Patient was discharged on oral Lasix 40 mg daily and is to follow-up with cardiology and pulmonary outpatient setting. Patient will be set up by pulmonary for sleep study.   #3 hyperlipidemia Patient was placed on a statin during the hospitalization be discharged on a statin.  #4 probable obstructive sleep apnea Patient will need outpatient sleep study. Pulmonary to set up as an outpatient.  #6 acute kidney injury Resolved.  #7 coronary artery disease Stable. Cardiac medications of metoprolol and ARB were held due to borderline blood pressure and not resumed on discharge. Patient will follow-up with cardiology in the outpatient setting.     NP ov  08/17/16  Begin Anoro 1 puff daily . Rinse after use> stopped after one week   [place] job not smoking . Keep up good work . Marland Kitchen  Decrease Oxygen 2l/m .  Order for POC oxygen device.    09/25/2016  Extended f/u ov/Deanna Johnston re: re-establish for copd management after failing to f/u in 04/2013 as above  Chief Complaint  Patient presents with  . Follow-up    review pft.  pt states she is doing well, denies any current breathing complaints.     thoroughly confused between maint and prn, not clear did better on anoro as never changed saba to prn  Doe = MMRC2 = can't walk a nl pace on a flat grade s sob but does fine slow and flat eg [employer] shopping on 2lpm does not titrate (has meter, doesn't use with activity) rec When you walk keep saturations above 90%  - same goal when sitting. Plan A = Automatic =  Anoro one click each am  - 2 good drags  Plan B = Backup Only use your albuterol as a rescue medication  Please see patient coordinator before you leave today  to schedule ambulatory 02 concentrator eligbility        12/26/2016  f/u ov/Deanna Johnston re:  Copd GOLD   II / maint on Anoro/ no need for neb / using 02 2lpm hs and prn daytime Chief Complaint  Patient presents with  . Follow-up    Breathing is doing well. She does not have a rescue inhaler and requests that we prescribe one.    doe x MMRC2 = can't walk a nl pace on a flat grade s sob but  does fine slow and flat eg shopping on 02 No need for any saba at all and want to get rid of neb   No obvious day to day or daytime variability or assoc excess/ purulent sputum or mucus plugs or hemoptysis or cp or chest tightness, subjective wheeze or overt sinus or hb symptoms. No unusual exp hx or h/o childhood pna/ asthma or knowledge of premature birth.  Sleeping ok 2lpm one pillow without nocturnal  or early am exacerbation  of respiratory  c/o's or need for noct saba. Also denies any obvious fluctuation of symptoms with weather or environmental changes or other aggravating or alleviating factors except as outlined above   Current Allergies, Complete Past Medical History, Past Surgical History, Family History, and Social History were reviewed in [employer] record.  ROS  The following are not active complaints unless bolded Hoarseness, sore throat, dysphagia, dental problems, itching, sneezing,  nasal congestion or discharge of excess mucus or purulent secretions,  ear ache,   fever, chills, sweats, unintended wt loss or wt gain, classically pleuritic or exertional cp,  orthopnea pnd or leg swelling, presyncope, palpitations, abdominal pain, anorexia, nausea, vomiting, diarrhea  or change in bowel habits or change in bladder habits, change in stools or change in urine, dysuria, hematuria,  rash, arthralgias, visual complaints, headache, numbness, weakness or ataxia or problems with walking or coordination,  change in mood/affect or memory.        Current Meds  Medication Sig  . aspirin EC 81 MG tablet Take 81 mg by mouth daily.  . furosemide (LASIX) 40 MG tablet Take 1 tablet (40 mg total) by mouth daily.  Marland Kitchen levothyroxine (SYNTHROID) 50 MCG tablet Take 1 tablet (50 mcg total) by mouth daily before breakfast.  . nitroGLYCERIN (NITROSTAT) 0.4 MG SL tablet PLACE 1 TABLET UNDER THE TONGUE EVERY 5 MINUTES AS NEEDED FOR CHEST PAIN  . OXYGEN 2lpm 24/7  AHC  . potassium chloride SA (K-DUR,KLOR-CON) 20 MEQ tablet Take 1 tablet (20 mEq total) by mouth daily.  Marland Kitchen umeclidinium-vilanterol (ANORO ELLIPTA) 62.5-25 MCG/INH AEPB Only open the device one time and then take your two separate drags to be sure you get it all               Objective:   Physical Exam  amb pleasant mod obese wf nad/ all smiles today     04/14/2013     153 > 09/25/2016   155 > 12/26/2016     160     03/11/13 153 lb 12.8 oz (69.763 kg)  09/04/12 159 lb (72.122 kg)  08/02/12 158 lb (71.668 kg)     Vital signs reviewed   - Note on arrival 02 sats  92% on 2lpm   HEENT: nl dentition, turbinates bilaterally, and oropharynx. Nl external ear canals without cough reflex   NECK :  without JVD/Nodes/TM/ nl carotid upstrokes bilaterally   LUNGS: no acc muscle use,  slt barrel contour with distant bs bilaterally s wheeze    CV:  RRR  no s3 or murmur or increase in P2, and no edema   ABD:  soft and nontender with nl inspiratory excursion in the supine position. No bruits or organomegaly  appreciated, bowel sounds nl  MS:  Nl gait/ ext warm without deformities, calf tenderness, cyanosis or clubbing No obvious joint restrictions   SKIN: warm and dry without lesions    NEURO:  alert, approp, nl sensorium with  no motor or cerebellar deficits apparent.  Assessment & Plan:

## 2016-12-26 NOTE — Patient Instructions (Addendum)
Goal is to keeps sats over 90% at all times so keep on the 2lpm at bedtime and adjust during the day to keep over 90% depending on your level of activity.   Plan A = Automatic = Anoro one click each am or nebulizer up to 4 x daily to use it up   Plan B = Backup Only use your albuterol (Proair) as a rescue medication to be used if you can't catch your breath by resting or doing a relaxed purse lip breathing pattern.  - The less you use it, the better it will work when you need it. - Ok to use the inhaler up to 2 puffs  every 4 hours if you must but call for appointment if use goes up over your usual need - Don't leave home without it !!       Please schedule a follow up visit in 6  months but call sooner if needed

## 2016-12-26 NOTE — Assessment & Plan Note (Signed)
-   PFTs 04/14/2013  FEV1  1.03 (59%) ratio 69 and no better with dlco 66% - PFT's  09/25/2016  FEV1 1.16  (71 % ) ratio 64  p no % improvement from saba p neb w/in 4 h  prior to study with DLCO  58/52  % corrects to 62 % for alv volume   - 09/25/2016  After extensive coaching  Device  effectiveness =    90% vs 0 % baseline (blew out first) on elipta device so rechallenge with anoro each am    Pt is Group B in terms of symptom/risk and laba/lama therefore appropriate rx at this point so continue anoro but needs a better prn than neb  - The proper method of use, as well as anticipated side effects, of a metered-dose inhaler are discussed and demonstrated to the patient.   Will use saba hfa but if can't master hfa always have the option of using proair respiclick but would need to be trained on this device.   I had an extended discussion with the patient reviewing all relevant studies completed to date and  lasting 15 to 20 minutes of a 25 minute visit    Each maintenance medication was reviewed in detail including most importantly the difference between maintenance and prns and under what circumstances the prns are to be triggered using an action plan format that is not reflected in the computer generated alphabetically organized AVS.    Please see AVS for specific instructions unique to this visit that I personally wrote and verbalized to the the pt in detail and then reviewed with pt  by my nurse highlighting any  changes in therapy recommended at today's visit to their plan of care.

## 2016-12-26 NOTE — Assessment & Plan Note (Signed)
Done on dopamine 52mcg/kg/min on 07/26/16   Ao = 110/47 (72) LV = 120/10 RA = 5 RV = 47/7 PA = 42/16 (27) PCW = 6 Fick cardiac output/index = 5.7/3.4 PVR = 4.2 WU SVR 945 Ao sat =  88% PA sat = 71%, 69% High SVC sat = 69%   Repeat echo  11/17/16  Left ventricle: The cavity size was normal. Systolic function was   normal. The estimated ejection fraction was in the range of 55%   to 60%. Wall motion was normal; there were no regional wall   motion abnormalities. Doppler parameters are consistent with   abnormal left ventricular relaxation (grade 1 diastolic   dysfunction). - Aortic valve: Transvalvular velocity was within the normal range.   There was no stenosis. There was no regurgitation. - Mitral valve: Transvalvular velocity was within the normal range.   There was no evidence for stenosis. There was no regurgitation. - Right ventricle: The cavity size was normal. Wall thickness was   normal. Systolic function was normal. - Atrial septum: No defect or patent foramen ovale was identified. - Tricuspid valve: There was trivial regurgitation. - Pulmonary arteries: Systolic pressure was within the normal   range.   'clearly improved off cigs and on adequate 02/  Need to be sure Deanna Johnston continues both

## 2016-12-28 ENCOUNTER — Encounter (HOSPITAL_COMMUNITY): Payer: Self-pay

## 2017-01-02 ENCOUNTER — Encounter (HOSPITAL_COMMUNITY)
Admission: RE | Admit: 2017-01-02 | Discharge: 2017-01-02 | Disposition: A | Discharge status: Home / Self Care | Payer: Self-pay | Source: Ambulatory Visit | Attending: Internal Medicine | Admitting: Internal Medicine

## 2017-01-08 ENCOUNTER — Other Ambulatory Visit: Payer: Self-pay | Admitting: Adult Health

## 2017-01-09 ENCOUNTER — Encounter (HOSPITAL_COMMUNITY)
Admission: RE | Admit: 2017-01-09 | Discharge: 2017-01-09 | Disposition: A | Discharge status: Home / Self Care | Payer: Self-pay | Source: Ambulatory Visit | Attending: Internal Medicine | Admitting: Internal Medicine

## 2017-01-11 ENCOUNTER — Encounter (HOSPITAL_COMMUNITY)
Admission: RE | Admit: 2017-01-11 | Discharge: 2017-01-11 | Disposition: A | Discharge status: Home / Self Care | Payer: Self-pay | Source: Ambulatory Visit | Attending: Internal Medicine | Admitting: Internal Medicine

## 2017-01-15 ENCOUNTER — Other Ambulatory Visit (HOSPITAL_COMMUNITY): Payer: Self-pay | Admitting: Internal Medicine

## 2017-01-15 ENCOUNTER — Ambulatory Visit
Admission: RE | Admit: 2017-01-15 | Discharge: 2017-01-15 | Disposition: A | Discharge status: Home / Self Care | Payer: Medicare Other | Source: Ambulatory Visit | Attending: Family Medicine | Admitting: Family Medicine

## 2017-01-15 DIAGNOSIS — Z139 Encounter for screening, unspecified: Secondary | ICD-10-CM

## 2017-01-16 ENCOUNTER — Encounter (HOSPITAL_COMMUNITY)
Admission: RE | Admit: 2017-01-16 | Discharge: 2017-01-16 | Disposition: A | Discharge status: Home / Self Care | Payer: Self-pay | Source: Ambulatory Visit | Attending: Internal Medicine | Admitting: Internal Medicine

## 2017-01-16 DIAGNOSIS — I50810 Right heart failure, unspecified: Secondary | ICD-10-CM | POA: Insufficient documentation

## 2017-01-18 ENCOUNTER — Encounter (HOSPITAL_COMMUNITY)
Admission: RE | Admit: 2017-01-18 | Discharge: 2017-01-18 | Disposition: A | Discharge status: Home / Self Care | Payer: Self-pay | Source: Ambulatory Visit | Attending: Internal Medicine | Admitting: Internal Medicine

## 2017-01-18 ENCOUNTER — Other Ambulatory Visit (HOSPITAL_COMMUNITY): Payer: Self-pay | Admitting: *Deleted

## 2017-01-23 ENCOUNTER — Encounter (HOSPITAL_COMMUNITY): Payer: Self-pay

## 2017-01-25 ENCOUNTER — Encounter (HOSPITAL_COMMUNITY): Payer: Self-pay

## 2017-01-30 ENCOUNTER — Encounter (HOSPITAL_COMMUNITY)
Admission: RE | Admit: 2017-01-30 | Discharge: 2017-01-30 | Disposition: A | Discharge status: Home / Self Care | Payer: Self-pay | Source: Ambulatory Visit | Attending: Internal Medicine | Admitting: Internal Medicine

## 2017-02-01 ENCOUNTER — Encounter (HOSPITAL_COMMUNITY)
Admission: RE | Admit: 2017-02-01 | Discharge: 2017-02-01 | Disposition: A | Discharge status: Home / Self Care | Payer: Self-pay | Source: Ambulatory Visit | Attending: Internal Medicine | Admitting: Internal Medicine

## 2017-02-08 ENCOUNTER — Encounter (HOSPITAL_COMMUNITY)
Admission: RE | Admit: 2017-02-08 | Discharge: 2017-02-08 | Disposition: A | Discharge status: Home / Self Care | Payer: Self-pay | Source: Ambulatory Visit | Attending: Internal Medicine | Admitting: Internal Medicine

## 2017-02-15 ENCOUNTER — Encounter (HOSPITAL_COMMUNITY)
Admission: RE | Admit: 2017-02-15 | Discharge: 2017-02-15 | Disposition: A | Discharge status: Home / Self Care | Payer: Self-pay | Source: Ambulatory Visit | Attending: Internal Medicine | Admitting: Internal Medicine

## 2017-02-15 DIAGNOSIS — I50810 Right heart failure, unspecified: Secondary | ICD-10-CM | POA: Insufficient documentation

## 2017-02-20 ENCOUNTER — Encounter (HOSPITAL_COMMUNITY)
Admission: RE | Admit: 2017-02-20 | Discharge: 2017-02-20 | Disposition: A | Discharge status: Home / Self Care | Payer: Self-pay | Source: Ambulatory Visit | Attending: Internal Medicine | Admitting: Internal Medicine

## 2017-02-22 ENCOUNTER — Telehealth (HOSPITAL_COMMUNITY): Payer: Self-pay | Admitting: *Deleted

## 2017-02-22 NOTE — Telephone Encounter (Signed)
Thanks

## 2017-02-27 ENCOUNTER — Encounter (HOSPITAL_COMMUNITY)
Admission: RE | Admit: 2017-02-27 | Discharge: 2017-02-27 | Disposition: A | Discharge status: Home / Self Care | Payer: Self-pay | Source: Ambulatory Visit | Attending: Internal Medicine | Admitting: Internal Medicine

## 2017-03-01 ENCOUNTER — Encounter (HOSPITAL_COMMUNITY)
Admission: RE | Admit: 2017-03-01 | Discharge: 2017-03-01 | Disposition: A | Discharge status: Home / Self Care | Payer: Self-pay | Source: Ambulatory Visit | Attending: Internal Medicine | Admitting: Internal Medicine

## 2017-03-06 ENCOUNTER — Encounter (HOSPITAL_COMMUNITY)
Admission: RE | Admit: 2017-03-06 | Discharge: 2017-03-06 | Disposition: A | Discharge status: Home / Self Care | Payer: Self-pay | Source: Ambulatory Visit | Attending: Internal Medicine | Admitting: Internal Medicine

## 2017-03-07 ENCOUNTER — Ambulatory Visit (INDEPENDENT_AMBULATORY_CARE_PROVIDER_SITE_OTHER): Payer: Medicare Other | Admitting: Physician Assistant

## 2017-03-07 ENCOUNTER — Encounter (INDEPENDENT_AMBULATORY_CARE_PROVIDER_SITE_OTHER): Payer: Self-pay | Admitting: Physician Assistant

## 2017-03-07 ENCOUNTER — Ambulatory Visit (INDEPENDENT_AMBULATORY_CARE_PROVIDER_SITE_OTHER): Payer: Medicare Other

## 2017-03-07 VITALS — Ht <= 58 in | Wt 160.0 lb

## 2017-03-07 DIAGNOSIS — M2242 Chondromalacia patellae, left knee: Secondary | ICD-10-CM | POA: Diagnosis not present

## 2017-03-07 DIAGNOSIS — M25562 Pain in left knee: Secondary | ICD-10-CM

## 2017-03-07 NOTE — Progress Notes (Signed)
Office Visit Note   Patient: Deanna Johnston           Date of Birth: 04-Jan-1946           MRN: [id number] Visit Date: 03/07/2017              Requested by: Kelton Pillar, MD [address] [place], [place] [address] PCP: Kelton Pillar, MD   Assessment & Plan: Visit Diagnoses:  1. Acute pain of left knee   2. Chondromalacia patellae, left knee     Plan: Quad strengthening exercises.  She can try some tumor see if that helps.  Follow-up if she develops any pain or mechanical symptoms.  Talked to her about knee friendly exercises.  Follow-up as needed  Follow-Up Instructions: Return if symptoms worsen or fail to improve.   Orders:  Orders Placed This Encounter  Procedures  . XR Knee 1-2 Views Left   No orders of the defined types were placed in this encounter.     Procedures: No procedures performed   Clinical Data: No additional findings.   Subjective: Chief Complaint  Patient presents with  . Left Knee - Pain    HPI Deanna Johnston comes in today due to left knee pain laterally.  She states that she had 2 instances over the past 2 weeks her knee popped and pain went away she states the popping did not cause any pain at all in either knee.  She developed the knee pain after crawling around aromatic on her knees.  She states she is absolutely had no mechanical symptoms or pain in the left knee at this point in time. Review of Systems See HPI otherwise negative  Objective: Vital Signs: Ht 4\' 10"  (1.473 m)   Wt 160 lb (72.6 kg)   BMI 33.44 kg/m   Physical Exam  Constitutional: She is oriented to person, place, and time. She appears well-developed and well-nourished. No distress.  Neurological: She is alert and oriented to person, place, and time.  Skin: She is not diaphoretic.  Psychiatric: She has a normal mood and affect.    Ortho Exam Bilateral knees she has good range of motion.  Patella is to have tracked laterally and there is some slight  crepitus in both knees with passive range of motion.  No instability with valgus varus stressing.  McMurray's is negative on the left.  She has no tenderness along medial lateral joint lines.  No effusion abnormal warmth of the left knee. Specialty Comments:  No specialty comments available.  Imaging: Xr Knee 1-2 Views Left  Result Date: 03/07/2017 AP lateral views left knee: No acute fracture.  Moderate medial joint line narrowing.  Patella tracks laterally on the AP view.  No other bony abnormalities.  Knee is well located.    PMFS History: Patient Active Problem List   Diagnosis Date Noted  . Cor pulmonale ([place]) 09/25/2016  . Olecranon bursitis of left elbow 08/31/2016  . Chronic respiratory failure with hypoxia ([place]) 08/17/2016  . Right heart failure ([place])   . Hypoxia   . PAH (pulmonary artery hypertension) ([place])   . Pressure injury of skin 07/21/2016  . Hypotension   . Cardiogenic shock ([place])   . Right ventricular failure ([place])   . Acute respiratory failure with hypoxia and hypercapnia (HCC)   . Acute on chronic heart failure ([place]) 07/20/2016  . COPD GOLD II 03/12/2013  . Cough 03/11/2013  . Essential hypertension, benign 03/11/2013  . Hyperlipidemia   .  Coronary artery disease   . Obesity   . MI (myocardial infarction) ([place])   . Tobacco dependence    Past Medical History:  Diagnosis Date  . Cataract   . Cervical dysplasia   . COPD (chronic obstructive pulmonary disease) ([place])   . Coronary artery disease   . Hyperlipidemia   . MI (myocardial infarction) [facility]) Dec. 2011   stent mid right coronary  . Obesity   . Tobacco dependence   . VAIN (vaginal intraepithelial neoplasia) 2011   Efudex treatment    Family History  Problem Relation Age of Onset  . Heart failure Mother   . Stomach cancer Mother   . Heart disease Father   . Diabetes Father   . Colon cancer Sister   . Heart disease Brother     Past Surgical History:  Procedure Laterality Date  . ABDOMINAL  HYSTERECTOMY     TAH BSO  . anal fistula repair    . CARDIAC CATHETERIZATION  2011 Dec.   stent mid right coronary,Primus element stent  . CATARACT EXTRACTION    . COLPOSCOPY    . OOPHORECTOMY     BSO  . OTHER SURGICAL HISTORY     hysterectomy  . RIGHT/LEFT HEART CATH AND CORONARY ANGIOGRAPHY N/A 07/26/2016   Procedure: Right/Left Heart Cath and Coronary Angiography;  Surgeon: Jolaine Artist, MD;  Location: [facility];  Service: Cardiovascular;  Laterality: N/A;   Social History   Occupational History  . Occupation: Retired    Fish farm manager: RETIRED  Tobacco Use  . Smoking status: Former Smoker    Packs/day: 0.50    Years: 47.00    Pack years: 23.50    Types: Cigarettes    Last attempt to quit: 07/20/2016    Years since quitting: 0.6  . Smokeless tobacco: Never Used  Substance and Sexual Activity  . Alcohol use: Yes    Alcohol/week: 0.0 oz    Comment: Rare  . Drug use: No  . Sexual activity: No    Birth control/protection: Post-menopausal, Surgical    Comment: HYST-1st intercourse 72 yo-More than 5 partners

## 2017-03-08 ENCOUNTER — Encounter (HOSPITAL_COMMUNITY)
Admission: RE | Admit: 2017-03-08 | Discharge: 2017-03-08 | Disposition: A | Discharge status: Home / Self Care | Payer: Self-pay | Source: Ambulatory Visit | Attending: Internal Medicine | Admitting: Internal Medicine

## 2017-03-13 ENCOUNTER — Encounter (HOSPITAL_COMMUNITY)
Admission: RE | Admit: 2017-03-13 | Discharge: 2017-03-13 | Disposition: A | Discharge status: Home / Self Care | Payer: Self-pay | Source: Ambulatory Visit | Attending: Internal Medicine | Admitting: Internal Medicine

## 2017-03-20 ENCOUNTER — Encounter (HOSPITAL_COMMUNITY)
Admission: RE | Admit: 2017-03-20 | Discharge: 2017-03-20 | Disposition: A | Discharge status: Home / Self Care | Payer: Self-pay | Source: Ambulatory Visit | Attending: Internal Medicine | Admitting: Internal Medicine

## 2017-03-20 DIAGNOSIS — I50810 Right heart failure, unspecified: Secondary | ICD-10-CM | POA: Insufficient documentation

## 2017-03-22 ENCOUNTER — Encounter (HOSPITAL_COMMUNITY)
Admission: RE | Admit: 2017-03-22 | Discharge: 2017-03-22 | Disposition: A | Discharge status: Home / Self Care | Payer: Self-pay | Source: Ambulatory Visit | Attending: Internal Medicine | Admitting: Internal Medicine

## 2017-03-27 ENCOUNTER — Encounter (HOSPITAL_COMMUNITY)
Admission: RE | Admit: 2017-03-27 | Discharge: 2017-03-27 | Disposition: A | Discharge status: Home / Self Care | Payer: Self-pay | Source: Ambulatory Visit | Attending: Internal Medicine | Admitting: Internal Medicine

## 2017-03-29 ENCOUNTER — Encounter (HOSPITAL_COMMUNITY): Payer: Self-pay

## 2017-04-03 ENCOUNTER — Encounter (HOSPITAL_COMMUNITY)
Admission: RE | Admit: 2017-04-03 | Discharge: 2017-04-03 | Disposition: A | Discharge status: Home / Self Care | Payer: Self-pay | Source: Ambulatory Visit | Attending: Internal Medicine | Admitting: Internal Medicine

## 2017-04-05 ENCOUNTER — Encounter (HOSPITAL_COMMUNITY)
Admission: RE | Admit: 2017-04-05 | Discharge: 2017-04-05 | Disposition: A | Discharge status: Home / Self Care | Payer: Self-pay | Source: Ambulatory Visit | Attending: Internal Medicine | Admitting: Internal Medicine

## 2017-04-10 ENCOUNTER — Encounter (HOSPITAL_COMMUNITY)
Admission: RE | Admit: 2017-04-10 | Discharge: 2017-04-10 | Disposition: A | Discharge status: Home / Self Care | Payer: Self-pay | Source: Ambulatory Visit | Attending: Internal Medicine | Admitting: Internal Medicine

## 2017-04-12 ENCOUNTER — Encounter (HOSPITAL_COMMUNITY)
Admission: RE | Admit: 2017-04-12 | Discharge: 2017-04-12 | Disposition: A | Discharge status: Home / Self Care | Payer: Self-pay | Source: Ambulatory Visit | Attending: Internal Medicine | Admitting: Internal Medicine

## 2017-04-17 ENCOUNTER — Encounter (HOSPITAL_COMMUNITY)
Admission: RE | Admit: 2017-04-17 | Discharge: 2017-04-17 | Disposition: A | Discharge status: Home / Self Care | Payer: Self-pay | Source: Ambulatory Visit | Attending: Internal Medicine | Admitting: Internal Medicine

## 2017-04-17 DIAGNOSIS — I50810 Right heart failure, unspecified: Secondary | ICD-10-CM | POA: Insufficient documentation

## 2017-04-19 ENCOUNTER — Encounter (HOSPITAL_COMMUNITY): Payer: Self-pay

## 2017-04-24 ENCOUNTER — Encounter (HOSPITAL_COMMUNITY)
Admission: RE | Admit: 2017-04-24 | Discharge: 2017-04-24 | Disposition: A | Discharge status: Home / Self Care | Payer: Self-pay | Source: Ambulatory Visit | Attending: Internal Medicine | Admitting: Internal Medicine

## 2017-04-26 ENCOUNTER — Encounter (HOSPITAL_COMMUNITY)
Admission: RE | Admit: 2017-04-26 | Discharge: 2017-04-26 | Disposition: A | Discharge status: Home / Self Care | Payer: Self-pay | Source: Ambulatory Visit | Attending: Internal Medicine | Admitting: Internal Medicine

## 2017-05-01 ENCOUNTER — Encounter (HOSPITAL_COMMUNITY): Payer: Self-pay

## 2017-05-03 ENCOUNTER — Encounter (HOSPITAL_COMMUNITY)
Admission: RE | Admit: 2017-05-03 | Discharge: 2017-05-03 | Disposition: A | Discharge status: Home / Self Care | Payer: Self-pay | Source: Ambulatory Visit | Attending: Internal Medicine | Admitting: Internal Medicine

## 2017-05-08 ENCOUNTER — Encounter (HOSPITAL_COMMUNITY)
Admission: RE | Admit: 2017-05-08 | Discharge: 2017-05-08 | Disposition: A | Discharge status: Home / Self Care | Payer: Self-pay | Source: Ambulatory Visit | Attending: Internal Medicine | Admitting: Internal Medicine

## 2017-05-09 ENCOUNTER — Other Ambulatory Visit: Payer: Self-pay | Admitting: Adult Health

## 2017-05-10 ENCOUNTER — Encounter (HOSPITAL_COMMUNITY)
Admission: RE | Admit: 2017-05-10 | Discharge: 2017-05-10 | Disposition: A | Discharge status: Home / Self Care | Payer: Self-pay | Source: Ambulatory Visit | Attending: Internal Medicine | Admitting: Internal Medicine

## 2017-05-12 ENCOUNTER — Emergency Department (HOSPITAL_COMMUNITY): Payer: Medicare Other

## 2017-05-12 ENCOUNTER — Emergency Department (HOSPITAL_COMMUNITY)
Admission: EM | Admit: 2017-05-12 | Discharge: 2017-05-12 | Disposition: A | Discharge status: Home / Self Care | Payer: Medicare Other | Attending: Emergency Medicine | Admitting: Emergency Medicine

## 2017-05-12 ENCOUNTER — Encounter (HOSPITAL_COMMUNITY): Payer: Self-pay | Admitting: Emergency Medicine

## 2017-05-12 DIAGNOSIS — Z79899 Other long term (current) drug therapy: Secondary | ICD-10-CM | POA: Insufficient documentation

## 2017-05-12 DIAGNOSIS — Z7982 Long term (current) use of aspirin: Secondary | ICD-10-CM | POA: Diagnosis not present

## 2017-05-12 DIAGNOSIS — Y92007 Garden or yard of unspecified non-institutional (private) residence as the place of occurrence of the external cause: Secondary | ICD-10-CM | POA: Diagnosis not present

## 2017-05-12 DIAGNOSIS — W01198A Fall on same level from slipping, tripping and stumbling with subsequent striking against other object, initial encounter: Secondary | ICD-10-CM | POA: Insufficient documentation

## 2017-05-12 DIAGNOSIS — M25532 Pain in left wrist: Secondary | ICD-10-CM | POA: Insufficient documentation

## 2017-05-12 DIAGNOSIS — I10 Essential (primary) hypertension: Secondary | ICD-10-CM | POA: Diagnosis not present

## 2017-05-12 DIAGNOSIS — Z87891 Personal history of nicotine dependence: Secondary | ICD-10-CM | POA: Insufficient documentation

## 2017-05-12 DIAGNOSIS — J449 Chronic obstructive pulmonary disease, unspecified: Secondary | ICD-10-CM | POA: Insufficient documentation

## 2017-05-12 DIAGNOSIS — W19XXXA Unspecified fall, initial encounter: Secondary | ICD-10-CM

## 2017-05-12 DIAGNOSIS — Y998 Other external cause status: Secondary | ICD-10-CM | POA: Diagnosis not present

## 2017-05-12 DIAGNOSIS — T148XXA Other injury of unspecified body region, initial encounter: Secondary | ICD-10-CM

## 2017-05-12 DIAGNOSIS — S0083XA Contusion of other part of head, initial encounter: Secondary | ICD-10-CM | POA: Diagnosis not present

## 2017-05-12 DIAGNOSIS — S0990XA Unspecified injury of head, initial encounter: Secondary | ICD-10-CM | POA: Diagnosis present

## 2017-05-12 DIAGNOSIS — I252 Old myocardial infarction: Secondary | ICD-10-CM | POA: Insufficient documentation

## 2017-05-12 DIAGNOSIS — Y9389 Activity, other specified: Secondary | ICD-10-CM | POA: Diagnosis not present

## 2017-05-12 DIAGNOSIS — I2510 Atherosclerotic heart disease of native coronary artery without angina pectoris: Secondary | ICD-10-CM | POA: Diagnosis not present

## 2017-05-12 NOTE — Discharge Instructions (Addendum)
Please keep the splint clean and dry.  It is not waterproof so if you take a shower, please cover it with a watertight plastic bag.  Take 650 mg of Tylenol once every 6 hours for pain control.  Elevating the left arm above the level of your heart will help with swelling.   Please call Monday morning to schedule a follow-up appointment with Dr. Ninfa Linden in approximately 1 week. The X-ray suggested a fracture of the left distal radius so we are treating it with a splint until it can be X-rayed again with Dr. Ninfa Linden.  Apply ice for 15-20 minutes to the area of bruising and swelling on your forehead.  Ice will help the swelling to go down.  It is normal for the Bruce to turn many colors including yellow, orange, or brown as it is healing.  It can also be common is skin below the area that is bruised now, such as above or below the eye appears bruised in a few  days due to gravity pulling the blood down. This is normal.   If you develop new or worsening symptoms including having another fall or injury, develop new vomiting, the most severe headache of your life, or new dizziness or lightheadedness, you should return to the emergency department for re-evaluation at that time.

## 2017-05-12 NOTE — ED Provider Notes (Signed)
[facility] DEPT Provider Note   CSN: [id number] Arrival date & time: 05/12/17  1532     History   Chief Complaint Chief Complaint  Patient presents with  . Fall  . Wrist Pain    HPI Deanna Johnston is a 72 y.o. female with a h/o of MI, CAD, and COPD on home O2 who presents to the emergency department with a chief complaint of fall.  The patient reports that she was outside watering her flowers when she tripped over her feet.  She states that she hit her left forehead against a tree then fell against the ground on her outstretched left hand.  Patient reports that she was able to get up.  She has been ambulatory.  She has a hematoma present over the left forehead.  No LOC, headache, nausea, emesis, dizziness, lightheadedness, weakness, numbness, or visual changes at this time.  In the ED, she complains of left forearm pain that has been constant, gradually worsening since the fall.  No previous left wrist surgery or injuries.  No treatment prior to arrival.  She takes a baby aspirin, but no other blood thinners.  The history is provided by the patient. No language interpreter was used.  Fall  Pertinent negatives include no headaches.  Wrist Pain  Pertinent negatives include no headaches.    Past Medical History:  Diagnosis Date  . Cataract   . Cervical dysplasia   . COPD (chronic obstructive pulmonary disease) ([place])   . Coronary artery disease   . Hyperlipidemia   . MI (myocardial infarction) [facility]) Dec. 2011   stent mid right coronary  . Obesity   . Tobacco dependence   . VAIN (vaginal intraepithelial neoplasia) 2011   Efudex treatment    Patient Active Problem List   Diagnosis Date Noted  . Cor pulmonale ([place]) 09/25/2016  . Olecranon bursitis of left elbow 08/31/2016  . Chronic respiratory failure with hypoxia ([place]) 08/17/2016  . Right heart failure ([place])   . Hypoxia   . PAH (pulmonary artery hypertension) ([place])   . Pressure injury  of skin 07/21/2016  . Hypotension   . Cardiogenic shock ([place])   . Right ventricular failure ([place])   . Acute respiratory failure with hypoxia and hypercapnia (HCC)   . Acute on chronic heart failure ([place]) 07/20/2016  . COPD GOLD II 03/12/2013  . Cough 03/11/2013  . Essential hypertension, benign 03/11/2013  . Hyperlipidemia   . Coronary artery disease   . Obesity   . MI (myocardial infarction) ([place])   . Tobacco dependence     Past Surgical History:  Procedure Laterality Date  . ABDOMINAL HYSTERECTOMY     TAH BSO  . anal fistula repair    . CARDIAC CATHETERIZATION  2011 Dec.   stent mid right coronary,Primus element stent  . CATARACT EXTRACTION    . COLPOSCOPY    . OOPHORECTOMY     BSO  . OTHER SURGICAL HISTORY     hysterectomy  . RIGHT/LEFT HEART CATH AND CORONARY ANGIOGRAPHY N/A 07/26/2016   Procedure: Right/Left Heart Cath and Coronary Angiography;  Surgeon: Jolaine Artist, MD;  Location: [facility];  Service: Cardiovascular;  Laterality: N/A;     OB History    Gravida  0   Para      Term      Preterm      AB      Living        SAB  TAB      Ectopic      Multiple      Live Births               Home Medications    Prior to Admission medications   Medication Sig Start Date End Date Taking? Authorizing Provider  albuterol (PROAIR HFA) 108 (90 Base) MCG/ACT inhaler 2 puffs every 4 hours as needed only  if your can't catch your breath 12/26/16   Tanda Rockers, MD  [facility] Xxx ELLIPTA 62.5-25 MCG/INH AEPB INHALE 1 PUFF INTO THE LUNGS DAILY 05/10/17   Tanda Rockers, MD  aspirin EC 81 MG tablet Take 81 mg by mouth daily.    [provider]  furosemide (LASIX) 40 MG tablet TAKE 1 TABLET BY MOUTH ONCE DAILY 01/17/17   Bensimhon, Shaune Pascal, MD  levothyroxine (SYNTHROID) 50 MCG tablet Take 1 tablet (50 mcg total) by mouth daily before breakfast. 09/25/16   Tanda Rockers, MD  nitroGLYCERIN (NITROSTAT) 0.4 MG SL tablet PLACE 1 TABLET  UNDER THE TONGUE EVERY 5 MINUTES AS NEEDED FOR CHEST PAIN 04/12/16   Martinique, Peter M, MD  OXYGEN 2lpm 24/7  [facility]    [provider]  potassium chloride SA (K-DUR,KLOR-CON) 20 MEQ tablet Take 1 tablet (20 mEq total) by mouth daily. 10/05/16   Bensimhon, Shaune Pascal, MD    Family History Family History  Problem Relation Age of Onset  . Heart failure Mother   . Stomach cancer Mother   . Heart disease Father   . Diabetes Father   . Colon cancer Sister   . Heart disease Brother     Social History Social History   Tobacco Use  . Smoking status: Former Smoker    Packs/day: 0.50    Years: 47.00    Pack years: 23.50    Types: Cigarettes    Last attempt to quit: 07/20/2016    Years since quitting: 0.8  . Smokeless tobacco: Never Used  Substance Use Topics  . Alcohol use: Yes    Alcohol/week: 0.0 oz    Comment: Rare  . Drug use: No     Allergies   Lipitor [atorvastatin]; Crestor [rosuvastatin calcium]; Prednisone; Sulfa antibiotics; Thyroid hormones; and Niaspan [niacin]   Review of Systems Review of Systems  Eyes: Negative for visual disturbance.  Gastrointestinal: Negative for nausea and vomiting.  Musculoskeletal: Positive for arthralgias and myalgias. Negative for back pain, gait problem, neck pain and neck stiffness.  Skin: Positive for wound.  Neurological: Negative for dizziness, syncope, weakness, numbness and headaches.     Physical Exam Updated Vital Signs BP (!) 155/63 (BP Location: Left Arm)   Pulse 88   Temp 98.1 F (36.7 C) (Oral)   Resp 20   Ht 4\' 11"  (1.499 m)   Wt 70.8 kg (156 lb)   SpO2 93%   BMI 31.51 kg/m   Physical Exam  Constitutional: No distress.  HENT:  Head: Normocephalic.  Hematoma present over the left forehead.  No palpable crepitus or step-offs to the area.   Eyes: Conjunctivae are normal.  Neck: Neck supple.  Cardiovascular: Normal rate and regular rhythm. Exam reveals no gallop and no friction rub.  No murmur  heard. Pulmonary/Chest: Effort normal. No respiratory distress.  Abdominal: Soft. She exhibits no distension.  Musculoskeletal: Normal range of motion. She exhibits tenderness. She exhibits no edema or deformity.  Tender to palpation over the left distal radius.  No distal ulnar tenderness.  Full active and passive range  of motion of the left wrist, elbow, and shoulder.  Independently moves all digits of the left hand.  Radial pulses are 2+ and symmetric.  No overlying erythema, edema, or warmth.  No obvious step-offs or deformities.  Neurological: She is alert.  Cranial nerves II through XII are grossly intact.  Finger to nose is normal bilaterally.  Symmetric tandem gait.  5 out of 5 strength of bilateral upper and lower extremities.  Sensation is intact throughout.  Skin: Skin is warm. No rash noted.  Psychiatric: Her behavior is normal.  Nursing note and vitals reviewed.    ED Treatments / Results  Labs (all labs ordered are listed, but only abnormal results are displayed) Labs Reviewed - No data to display  EKG None  Radiology Dg Wrist Complete Left  Result Date: 05/12/2017 CLINICAL DATA:  Fall EXAM: LEFT WRIST - COMPLETE 3+ VIEW COMPARISON:  None. FINDINGS: There is an osseous fragment dorsal to the distal radius, seen only on the lateral projection. There is moderate soft tissue swelling. The radiocarpal joint remains approximated. Ulnar styloid is intact. No carpal fracture. IMPRESSION: Suspected minimally displaced fracture of the distal left radius. This could be confirmed with CT, if necessary. Electronically Signed   By: Ulyses Jarred M.D.   On: 05/12/2017 17:41   Ct Head Wo Contrast  Result Date: 05/12/2017 CLINICAL DATA:  Fall EXAM: CT HEAD WITHOUT CONTRAST TECHNIQUE: Contiguous axial images were obtained from the base of the skull through the vertex without intravenous contrast. COMPARISON:  None. FINDINGS: Brain: No mass lesion, intraparenchymal hemorrhage or extra-axial  collection. No evidence of acute cortical infarct. Normal appearance of the brain parenchyma and extra axial spaces for age. Vascular: No hyperdense vessel or unexpected vascular calcification. Skull: Normal visualized skull base, calvarium and extracranial soft tissues. Sinuses/Orbits: No sinus fluid levels or advanced mucosal thickening. No mastoid effusion. Normal orbits. IMPRESSION: Normal aging brain. Electronically Signed   By: Ulyses Jarred M.D.   On: 05/12/2017 19:57    Procedures Procedures (including critical care time)  Medications Ordered in ED Medications - No data to display   Initial Impression / Assessment and Plan / ED Course  I have reviewed the triage vital signs and the nursing notes.  Pertinent labs & imaging results that were available during my care of the patient were reviewed by me and considered in my medical decision making (see chart for details).     72 year old female with a h/o of MI, CAD, and COPD with a chief complaint of fall.  She hit her left forehead against a tree before falling on her left outstretched hand.  The patient was seen and evaluated with Dr. Darl Householder, attending physician.  No anticoagulation other than a daily baby aspirin.  CT head is negative for ICH or SAH.  Left wrist x-ray with suspected minimally displaced fracture of the left distal radius.  The patient was placed in a sugar tong splint.  She is NVI following splint placement.  She has established with Dr. Ninfa Linden with orthopedics and has been referred back to his practice for follow-up.  Strict return precautions given.  She is hemodynamically stable and in no acute distress.  She is safe for discharge to home with outpatient follow-up at this time.  Final Clinical Impressions(s) / ED Diagnoses   Final diagnoses:  Fall, initial encounter  Hematoma  Left wrist pain    ED Discharge Orders    None       Jalien Weakland A, PA-C 05/12/17  2009    Drenda Freeze, MD 05/12/17  [phone]

## 2017-05-12 NOTE — ED Notes (Signed)
Bed: WTR9 Expected date:  Expected time:  Means of arrival:  Comments: 

## 2017-05-12 NOTE — ED Triage Notes (Signed)
Pt was watering flowers today and fell. C/o left wrist pain that is worse with movement.

## 2017-05-15 ENCOUNTER — Encounter (HOSPITAL_COMMUNITY): Payer: Self-pay

## 2017-05-15 ENCOUNTER — Telehealth (HOSPITAL_COMMUNITY): Payer: Self-pay | Admitting: Family Medicine

## 2017-05-15 DIAGNOSIS — I50810 Right heart failure, unspecified: Secondary | ICD-10-CM | POA: Insufficient documentation

## 2017-05-16 ENCOUNTER — Ambulatory Visit (HOSPITAL_COMMUNITY)
Admission: RE | Admit: 2017-05-16 | Discharge: 2017-05-16 | Disposition: A | Discharge status: Home / Self Care | Payer: Medicare Other | Source: Ambulatory Visit | Attending: Internal Medicine | Admitting: Internal Medicine

## 2017-05-16 ENCOUNTER — Encounter (HOSPITAL_COMMUNITY): Payer: Self-pay | Admitting: Internal Medicine

## 2017-05-16 ENCOUNTER — Other Ambulatory Visit: Payer: Self-pay

## 2017-05-16 VITALS — BP 132/80 | HR 90 | Wt 161.2 lb

## 2017-05-16 DIAGNOSIS — E875 Hyperkalemia: Secondary | ICD-10-CM | POA: Diagnosis not present

## 2017-05-16 DIAGNOSIS — Z87891 Personal history of nicotine dependence: Secondary | ICD-10-CM | POA: Insufficient documentation

## 2017-05-16 DIAGNOSIS — E669 Obesity, unspecified: Secondary | ICD-10-CM | POA: Diagnosis not present

## 2017-05-16 DIAGNOSIS — J449 Chronic obstructive pulmonary disease, unspecified: Secondary | ICD-10-CM | POA: Diagnosis present

## 2017-05-16 DIAGNOSIS — I2510 Atherosclerotic heart disease of native coronary artery without angina pectoris: Secondary | ICD-10-CM

## 2017-05-16 DIAGNOSIS — R600 Localized edema: Secondary | ICD-10-CM | POA: Diagnosis not present

## 2017-05-16 DIAGNOSIS — I959 Hypotension, unspecified: Secondary | ICD-10-CM | POA: Diagnosis not present

## 2017-05-16 DIAGNOSIS — Z955 Presence of coronary angioplasty implant and graft: Secondary | ICD-10-CM | POA: Diagnosis not present

## 2017-05-16 DIAGNOSIS — E785 Hyperlipidemia, unspecified: Secondary | ICD-10-CM | POA: Insufficient documentation

## 2017-05-16 DIAGNOSIS — I252 Old myocardial infarction: Secondary | ICD-10-CM | POA: Insufficient documentation

## 2017-05-16 DIAGNOSIS — Z7982 Long term (current) use of aspirin: Secondary | ICD-10-CM | POA: Diagnosis not present

## 2017-05-16 DIAGNOSIS — M549 Dorsalgia, unspecified: Secondary | ICD-10-CM | POA: Insufficient documentation

## 2017-05-16 DIAGNOSIS — Z79899 Other long term (current) drug therapy: Secondary | ICD-10-CM | POA: Diagnosis not present

## 2017-05-16 DIAGNOSIS — J9611 Chronic respiratory failure with hypoxia: Secondary | ICD-10-CM

## 2017-05-16 DIAGNOSIS — I50812 Chronic right heart failure: Secondary | ICD-10-CM

## 2017-05-16 LAB — BASIC METABOLIC PANEL
Anion gap: 10 (ref 5–15)
BUN: 14 mg/dL (ref 6–20)
CHLORIDE: 98 mmol/L — AB (ref 101–111)
CO2: 29 mmol/L (ref 22–32)
Calcium: 9.3 mg/dL (ref 8.9–10.3)
Creatinine, Ser: 0.93 mg/dL (ref 0.44–1.00)
GFR calc non Af Amer: 60 mL/min — ABNORMAL LOW (ref >60)
Glucose, Bld: 128 mg/dL — ABNORMAL HIGH (ref 65–99)
POTASSIUM: 3.5 mmol/L (ref 3.5–5.1)
SODIUM: 137 mmol/L (ref 135–145)

## 2017-05-16 NOTE — Progress Notes (Signed)
HF: Dr Haroldine Laws Pulmonary : Dr Melvyn Novas PCP: Dr Kelton Pillar   ADVANCED HF CLINIC NOTE  HPI: Deanna Johnston) is a 72 year old female with PMH of COPD with ongoing tobacco use, CAD s/p NSTEMI with RCA stents in 2011 and obesity.  Admitted to [facility] on 07/20/16 with progressive dyspnea and LE edema. Upon arrival to ED BNP 1113.9, WBC 11.6, Lactic 1.45, ABG 7.3/60/63. Hypotensive with systolic 01-09, oxygenation 90s on 4L [place], received 500 ml bolus and Rocephin/Azithromycin. Admitted to CCM.   Today she returns for HF follow up. She has had a couple of mechanical fall x 2. Overall feeling fine. Complains of back pain with ambulation so she holds her breath. Denies SOB/PND/Orthopnea. Continues to use 2 liters oxygen. Appetite ok. Has been eating high sodium foods. No fever or chills. Weight at home  157 pounds. Thinks she has gained 5 pounds. Taking all medications. Says she has cut back her thyroid medications because she gained weight. She continues at pulmonary rehab.   Testing  Echo 07/21/16: LV EF 65-70% with dilated RV and moderate RV HK. Started on dopamine for RV support. CT chest no PE. Diuresed well and renal function improved.  11/2016  LV function remains normal. RV now improved with normal size and function.  Underwent RHC/LHC on 07/26/16   Mid RCA to Dist RCA lesion, 0 %stenosed.  Mid RCA lesion, 20 %stenosed.  Dist RCA lesion, 30 %stenosed.  Dist Cx lesion, 60 %stenosed.  Prox LAD to Mid LAD lesion, 20 %stenosed.  The left ventricular ejection fraction is greater than 65% by visual estimate. Findings: Done on dopamine 16mcg/kg/min Ao = 110/47 (72) LV = 120/10 RA = 5 RV = 47/7 PA = 42/16 (27) PCW = 6 Fick cardiac output/index = 5.7/3.4 PVR = 4.2 WU SVR 945 Ao sat = 88% PA sat = 71%, 69% High SVC sat = 69% Assessment: 1. Heavily calcified coronary arteries with only minimal luminal CAD. Widely patent RCA stent 2. LVEF 70% without regional wall motion  abnormalities 3. Mild PAH with normal cardiac output on low-dose dopamine 4. No intracardiac shunt 4. PAD - central aortic pressure ~29mmHG greater than radial artery pressure or cuff pressure  CMRI done to evaluate RV dysfunction - LVEF 77% No RV infarct. Possible infiltrative process but not classic amyloid process. No RV scar  SPEP UPEP negative.  (cMRI reviewed with Dr. Meda Coffee and not felt to have infiltrative process)  SH:  Social History   Socioeconomic History  . Marital status: Single    Spouse name: Not on file  . Number of children: Not on file  . Years of education: Not on file  . Highest education level: Not on file  Occupational History  . Occupation: Retired    Fish farm manager: RETIRED  Social Needs  . Financial resource strain: Not on file  . Food insecurity:    Worry: Not on file    Inability: Not on file  . Transportation needs:    Medical: Not on file    Non-medical: Not on file  Tobacco Use  . Smoking status: Former Smoker    Packs/day: 0.50    Years: 47.00    Pack years: 23.50    Types: Cigarettes    Last attempt to quit: 07/20/2016    Years since quitting: 0.8  . Smokeless tobacco: Never Used  Substance and Sexual Activity  . Alcohol use: Yes    Alcohol/week: 0.0 oz    Comment: Rare  . Drug  use: No  . Sexual activity: Never    Birth control/protection: Post-menopausal, Surgical    Comment: HYST-1st intercourse 71 yo-More than 5 partners  Lifestyle  . Physical activity:    Days per week: Not on file    Minutes per session: Not on file  . Stress: Not on file  Relationships  . Social connections:    Talks on phone: Not on file    Gets together: Not on file    Attends religious service: Not on file    Active member of club or organization: Not on file    Attends meetings of clubs or organizations: Not on file    Relationship status: Not on file  . Intimate partner violence:    Fear of current or ex partner: Not on file    Emotionally abused: Not on  file    Physically abused: Not on file    Forced sexual activity: Not on file  Other Topics Concern  . Not on file  Social History Narrative  . Not on file    FH:  Family History  Problem Relation Age of Onset  . Heart failure Mother   . Stomach cancer Mother   . Heart disease Father   . Diabetes Father   . Colon cancer Sister   . Heart disease Brother     Past Medical History:  Diagnosis Date  . Cataract   . Cervical dysplasia   . COPD (chronic obstructive pulmonary disease) ([place])   . Coronary artery disease   . Hyperlipidemia   . MI (myocardial infarction) [facility]) Dec. 2011   stent mid right coronary  . Obesity   . Tobacco dependence   . VAIN (vaginal intraepithelial neoplasia) 2011   Efudex treatment    Current Outpatient Medications  Medication Sig Dispense Refill  . albuterol (PROAIR HFA) 108 (90 Base) MCG/ACT inhaler 2 puffs every 4 hours as needed only  if your can't catch your breath 1 Inhaler 1  . ANORO ELLIPTA 62.5-25 MCG/INH AEPB INHALE 1 PUFF INTO THE LUNGS DAILY 60 each 5  . aspirin EC 81 MG tablet Take 81 mg by mouth daily.    . furosemide (LASIX) 40 MG tablet TAKE 1 TABLET BY MOUTH ONCE DAILY 30 tablet 3  . levothyroxine (SYNTHROID) 50 MCG tablet Take 1 tablet (50 mcg total) by mouth daily before breakfast.    . nitroGLYCERIN (NITROSTAT) 0.4 MG SL tablet PLACE 1 TABLET UNDER THE TONGUE EVERY 5 MINUTES AS NEEDED FOR CHEST PAIN 75 tablet 0  . OXYGEN 2lpm 24/7  [facility]    . potassium chloride SA (K-DUR,KLOR-CON) 20 MEQ tablet Take 1 tablet (20 mEq total) by mouth daily. 90 tablet 3   No current facility-administered medications for this encounter.     Vitals:   05/16/17 1453  BP: 132/80  Pulse: 90  SpO2: 97%  Weight: 161 lb 4 oz (73.1 kg)    PHYSICAL EXAM: General:  Well appearing. No resp difficulty.  HEENT: normal Neck: supple. JVP ~10 . Carotids 2+ bilat; no bruits. No lymphadenopathy or thryomegaly appreciated. Cor: PMI nondisplaced. Regular  rate & rhythm. No rubs, gallops or murmurs. Lungs: clear Abdomen: soft, nontender, nondistended. No hepatosplenomegaly. No bruits or masses. Good bowel sounds. Extremities: no cyanosis, clubbing, rash, R and LLE ted hose.  Neuro: alert & orientedx3, cranial nerves grossly intact. moves all 4 extremities w/o difficulty. Affect pleasant    ASSESSMENT & PLAN: 1. RV failure - Suspect due to COPD and untreated OSA -  No evidence of RV infarct or infiltrative process on cMRI - Volume status elevated likely in the setting of high sodium diet. Continue 40 mg lasix daily and she will take an extra 40 mg lasix for the next 2 days.  - Off spiro due to hyperkalemia.   - Continue oxygen.  - Continue pulmonary rehab.    - 2. CAD - s/p inferior MI 2011 with RCA stents - cath with non-obstructive CAD in 6/18 (LAD 20%, dLCX 60%, RCA 30%) - No s/s hypotension.  - Continue ASA/statin  4. Tobacco use and chronic hypoxic respiratory failure -Quit in June 2018.  - Continue O2. Follow up with Pulmonary - Continue Pulmonary Rehab   Follow up in 6 months with an ECHO. Check BMET   Darrick Grinder, NP  3:01 PM

## 2017-05-16 NOTE — Patient Instructions (Signed)
Take an extra 40 mg of Furosemide for 2 days  Labs today  We will contact you in 6 months to schedule your next appointment.

## 2017-05-17 ENCOUNTER — Ambulatory Visit (INDEPENDENT_AMBULATORY_CARE_PROVIDER_SITE_OTHER): Payer: Medicare Other | Admitting: Physician Assistant

## 2017-05-17 ENCOUNTER — Encounter (HOSPITAL_COMMUNITY): Payer: Self-pay

## 2017-05-17 ENCOUNTER — Encounter (INDEPENDENT_AMBULATORY_CARE_PROVIDER_SITE_OTHER): Payer: Self-pay | Admitting: Physician Assistant

## 2017-05-17 DIAGNOSIS — S62101A Fracture of unspecified carpal bone, right wrist, initial encounter for closed fracture: Secondary | ICD-10-CM

## 2017-05-17 DIAGNOSIS — S62102A Fracture of unspecified carpal bone, left wrist, initial encounter for closed fracture: Secondary | ICD-10-CM

## 2017-05-17 NOTE — Progress Notes (Signed)
Office Visit Note   Patient: Deanna Johnston           Date of Birth: 1946-01-14           MRN: [id number] Visit Date: 05/17/2017              Requested by: Kelton Pillar, MD [address] [place], [place] [address] PCP: Kelton Pillar, MD   Assessment & Plan: Visit Diagnoses:  1. Closed fracture of right wrist, initial encounter     Plan: Place her in a short arm splint.  She is to wiggle her fingers as tolerated.  Also advised her to keep pressure off of her left elbow.  We will see her back in 10 days remove the splint and obtain 3 views of the left wrist.  Follow-Up Instructions: Return in about 11 days (around 05/28/2017) for Radiographs left wrist.   Orders:  No orders of the defined types were placed in this encounter.  No orders of the defined types were placed in this encounter.     Procedures: No procedures performed   Clinical Data: No additional findings.   Subjective: Chief Complaint  Patient presents with  . Left Wrist - Fracture    HPI Deanna Johnston service comes in today due to a left wrist injury occurred this past Saturday.  She reports that she was watering her flowers whenever she tripped over her feet.  She did hit her forehead against a tree and then fell to the ground on an outstretched left hand.  She was seen in the ER where she had a hematoma of the left forehead.  She denied any loss of consciousness nausea vomiting dizziness lightheadedness or visual changes. Radiographs of the left wrist in the ER showed on the lateral view and a osseous fragment dorsal aspect of the distal radius consistent with a displaced fracture.  All other views showed the distal radius to being in good position overall alignment without obvious fracture.  No obvious fracture of the ulna.  No carpal bone fractures.  I personally reviewed these x-rays. Review of Systems Please see HPI otherwise negative  Objective: Vital Signs: There were no vitals taken  for this visit.  Physical Exam  Constitutional: She is oriented to person, place, and time. She appears well-developed and well-nourished. No distress.  Pulmonary/Chest: Effort normal.  Neurological: She is alert and oriented to person, place, and time.  Skin: She is not diaphoretic.  Psychiatric: She has a normal mood and affect.    Ortho Exam Left upper extremity she has good range of motion the elbow.  She has slight erythema but no signs of bursitis over the olecranon bursa region.  She is nontender of the proximal radius and ulna.  She is able to supinate pronate the forearm.  She has full sensation in the hand and full motor of the hand.  Radial pulses intact.  She has ecchymosis over the distal forearm.  Tenderness over the distal radius only.  No tenderness over the distal ulna.  Remainder the hand is nontender.  No gross deformity of the left wrist or hand.  Forearm compartments are all soft. Specialty Comments:  No specialty comments available.  Imaging: No results found.   PMFS History: Patient Active Problem List   Diagnosis Date Noted  . Cor pulmonale ([place]) 09/25/2016  . Olecranon bursitis of left elbow 08/31/2016  . Chronic respiratory failure with hypoxia ([place]) 08/17/2016  . Right heart failure ([place])   .  Hypoxia   . PAH (pulmonary artery hypertension) ([place])   . Pressure injury of skin 07/21/2016  . Hypotension   . Cardiogenic shock ([place])   . Right ventricular failure ([place])   . Acute respiratory failure with hypoxia and hypercapnia (HCC)   . Acute on chronic heart failure ([place]) 07/20/2016  . COPD GOLD II 03/12/2013  . Cough 03/11/2013  . Essential hypertension, benign 03/11/2013  . Hyperlipidemia   . Coronary artery disease   . Obesity   . MI (myocardial infarction) ([place])   . Tobacco dependence    Past Medical History:  Diagnosis Date  . Cataract   . Cervical dysplasia   . COPD (chronic obstructive pulmonary disease) ([place])   . Coronary artery disease   .  Hyperlipidemia   . MI (myocardial infarction) [facility]) Dec. 2011   stent mid right coronary  . Obesity   . Tobacco dependence   . VAIN (vaginal intraepithelial neoplasia) 2011   Efudex treatment    Family History  Problem Relation Age of Onset  . Heart failure Mother   . Stomach cancer Mother   . Heart disease Father   . Diabetes Father   . Colon cancer Sister   . Heart disease Brother     Past Surgical History:  Procedure Laterality Date  . ABDOMINAL HYSTERECTOMY     TAH BSO  . anal fistula repair    . CARDIAC CATHETERIZATION  2011 Dec.   stent mid right coronary,Primus element stent  . CATARACT EXTRACTION    . COLPOSCOPY    . OOPHORECTOMY     BSO  . OTHER SURGICAL HISTORY     hysterectomy  . RIGHT/LEFT HEART CATH AND CORONARY ANGIOGRAPHY N/A 07/26/2016   Procedure: Right/Left Heart Cath and Coronary Angiography;  Surgeon: Jolaine Artist, MD;  Location: [facility];  Service: Cardiovascular;  Laterality: N/A;   Social History   Occupational History  . Occupation: Retired    Fish farm manager: RETIRED  Tobacco Use  . Smoking status: Former Smoker    Packs/day: 0.50    Years: 47.00    Pack years: 23.50    Types: Cigarettes    Last attempt to quit: 07/20/2016    Years since quitting: 0.8  . Smokeless tobacco: Never Used  Substance and Sexual Activity  . Alcohol use: Yes    Alcohol/week: 0.0 oz    Comment: Rare  . Drug use: No  . Sexual activity: Never    Birth control/protection: Post-menopausal, Surgical    Comment: HYST-1st intercourse 72 yo-More than 5 partners

## 2017-05-19 ENCOUNTER — Other Ambulatory Visit (HOSPITAL_COMMUNITY): Payer: Self-pay | Admitting: Internal Medicine

## 2017-05-22 ENCOUNTER — Encounter (HOSPITAL_COMMUNITY): Payer: Self-pay

## 2017-05-24 ENCOUNTER — Ambulatory Visit (INDEPENDENT_AMBULATORY_CARE_PROVIDER_SITE_OTHER): Payer: Medicare Other

## 2017-05-24 ENCOUNTER — Ambulatory Visit (INDEPENDENT_AMBULATORY_CARE_PROVIDER_SITE_OTHER): Payer: Medicare Other | Admitting: Physician Assistant

## 2017-05-24 ENCOUNTER — Encounter (HOSPITAL_COMMUNITY): Payer: Self-pay

## 2017-05-24 ENCOUNTER — Encounter (INDEPENDENT_AMBULATORY_CARE_PROVIDER_SITE_OTHER): Payer: Self-pay | Admitting: Physician Assistant

## 2017-05-24 DIAGNOSIS — M25532 Pain in left wrist: Secondary | ICD-10-CM | POA: Diagnosis not present

## 2017-05-24 DIAGNOSIS — S62102D Fracture of unspecified carpal bone, left wrist, subsequent encounter for fracture with routine healing: Secondary | ICD-10-CM

## 2017-05-24 NOTE — Progress Notes (Signed)
Mrs. Epting returns today for follow-up of her left distal wrist fracture.  She is overall doing well.  States the left wrist is swollen.  She is taking no pain medications.  Again she sustained a left wrist fracture on March 30 when she tripped over her "on the" while watering flowers.  She is placed in a short arm splint.  Physical exam: General well-developed well-nourished female no acute distress mood and affect appropriate.  Psych alert x3. Left forearm she has tenderness over the distal radius.  There is no gross malalignment.  Radial pulses intact.  Sensation full motor of the hand.  Positive edema no erythema about the hand no impending ulcers.  Radiographs: Left wrist 3 views show some interval healing.  Fracture remains nondisplaced does not appear to have any intra-articular involvement.    Impression: Left wrist fracture  Plan: We will place her in a removable long-arm Velcro splint.  She can come out of this for hygiene.  Otherwise treated as a cast.  We will see her back in 3 weeks and obtain 3 views of the left wrist.  Questions encouraged and answered at length.  Elevation with fingers encouraged.

## 2017-05-29 ENCOUNTER — Encounter (HOSPITAL_COMMUNITY)
Admission: RE | Admit: 2017-05-29 | Discharge: 2017-05-29 | Disposition: A | Discharge status: Home / Self Care | Payer: Self-pay | Source: Ambulatory Visit | Attending: Internal Medicine | Admitting: Internal Medicine

## 2017-05-31 ENCOUNTER — Encounter (HOSPITAL_COMMUNITY)
Admission: RE | Admit: 2017-05-31 | Discharge: 2017-05-31 | Disposition: A | Discharge status: Home / Self Care | Payer: Self-pay | Source: Ambulatory Visit | Attending: Internal Medicine | Admitting: Internal Medicine

## 2017-06-05 ENCOUNTER — Encounter (HOSPITAL_COMMUNITY)
Admission: RE | Admit: 2017-06-05 | Discharge: 2017-06-05 | Disposition: A | Discharge status: Home / Self Care | Payer: Medicare Other | Source: Ambulatory Visit | Attending: Internal Medicine | Admitting: Internal Medicine

## 2017-06-07 ENCOUNTER — Encounter (HOSPITAL_COMMUNITY): Payer: Self-pay

## 2017-06-07 ENCOUNTER — Telehealth (HOSPITAL_COMMUNITY): Payer: Self-pay

## 2017-06-07 NOTE — Telephone Encounter (Signed)
Patient called and stated she will not be attending class today as she is taking a mental health day.

## 2017-06-12 ENCOUNTER — Encounter (HOSPITAL_COMMUNITY)
Admission: RE | Admit: 2017-06-12 | Discharge: 2017-06-12 | Disposition: A | Discharge status: Home / Self Care | Payer: Self-pay | Source: Ambulatory Visit | Attending: Internal Medicine | Admitting: Internal Medicine

## 2017-06-14 ENCOUNTER — Ambulatory Visit (INDEPENDENT_AMBULATORY_CARE_PROVIDER_SITE_OTHER): Payer: Medicare Other

## 2017-06-14 ENCOUNTER — Ambulatory Visit (INDEPENDENT_AMBULATORY_CARE_PROVIDER_SITE_OTHER): Payer: Medicare Other | Admitting: Physician Assistant

## 2017-06-14 ENCOUNTER — Encounter (HOSPITAL_COMMUNITY)
Admission: RE | Admit: 2017-06-14 | Discharge: 2017-06-14 | Disposition: A | Discharge status: Home / Self Care | Payer: Self-pay | Source: Ambulatory Visit | Attending: Internal Medicine | Admitting: Internal Medicine

## 2017-06-14 ENCOUNTER — Encounter (INDEPENDENT_AMBULATORY_CARE_PROVIDER_SITE_OTHER): Payer: Self-pay | Admitting: Physician Assistant

## 2017-06-14 DIAGNOSIS — S62102D Fracture of unspecified carpal bone, left wrist, subsequent encounter for fracture with routine healing: Secondary | ICD-10-CM

## 2017-06-14 DIAGNOSIS — I50810 Right heart failure, unspecified: Secondary | ICD-10-CM | POA: Insufficient documentation

## 2017-06-14 NOTE — Progress Notes (Signed)
Office Visit Note   Patient: Deanna Johnston           Date of Birth: [birth year]           MRN: [id number] Visit Date: 06/14/2017              Requested by: Kelton Pillar, MD [address] [place], [place] [address] PCP: Kelton Pillar, MD   Assessment & Plan: Visit Diagnoses:  1. Closed fracture of left wrist with routine healing, subsequent encounter     Plan: She will continue the removable Velcro splint coming out of it for gentle range of motion and seated also for showering she can come out of it.  She will wear the brace for another 3 weeks.  We will see her back in 9 weeks and obtain 3 views of the left wrist.  Questions encouraged and answered at length today. No heavy lifting with the left arm. Follow-Up Instructions: Return in about 1 month (around 07/12/2017) for Radiographs.   Orders:  Orders Placed This Encounter  Procedures  . XR Wrist Complete Left   No orders of the defined types were placed in this encounter.     Procedures: No procedures performed   Clinical Data: No additional findings.   Subjective: Chief Complaint  Patient presents with  . Left Wrist - Pain    HPI Deanna Johnston returns today for follow-up almost 5 weeks status post left wrist fracture.  She has been in a removable wrist splint.  Overall doing well.  States she has no pain.  She has been doing some lifting with the left arm in the splint.  She has no complaints. Review of Systems See HPI otherwise negative  Objective: Vital Signs: There were no vitals taken for this visit.  Physical Exam  Constitutional: She is oriented to person, place, and time. She appears well-developed and well-nourished. No distress.  Pulmonary/Chest: Effort normal.  Neurological: She is alert and oriented to person, place, and time.  Skin: She is not diaphoretic.    Ortho Exam Left wrist no gross deformity.  She has no real tenderness over the distal wrist.  She has decreased  dorsiflexion volar flexion.  Radial pulses intact no rashes skin lesions ulcerations or impending ulcers. Specialty Comments:  No specialty comments available.  Imaging: Xr Wrist Complete Left  Result Date: 06/14/2017 Left wrist 3 views shows overall good position alignment of the left radius fracture .  There is abundant callus formation.  No other bony abnormalities.    PMFS History: Patient Active Problem List   Diagnosis Date Noted  . Cor pulmonale ([place]) 09/25/2016  . Olecranon bursitis of left elbow 08/31/2016  . Chronic respiratory failure with hypoxia ([place]) 08/17/2016  . Right heart failure ([place])   . Hypoxia   . PAH (pulmonary artery hypertension) ([place])   . Pressure injury of skin 07/21/2016  . Hypotension   . Cardiogenic shock ([place])   . Right ventricular failure ([place])   . Acute respiratory failure with hypoxia and hypercapnia (HCC)   . Acute on chronic heart failure ([place]) 07/20/2016  . COPD GOLD II 03/12/2013  . Cough 03/11/2013  . Essential hypertension, benign 03/11/2013  . Hyperlipidemia   . Coronary artery disease   . Obesity   . MI (myocardial infarction) ([place])   . Tobacco dependence    Past Medical History:  Diagnosis Date  . Cataract   . Cervical dysplasia   . COPD (chronic obstructive pulmonary disease) ([place])   .  Coronary artery disease   . Hyperlipidemia   . MI (myocardial infarction) [facility]) Dec. 2011   stent mid right coronary  . Obesity   . Tobacco dependence   . VAIN (vaginal intraepithelial neoplasia) 2011   Efudex treatment    Family History  Problem Relation Age of Onset  . Heart failure Mother   . Stomach cancer Mother   . Heart disease Father   . Diabetes Father   . Colon cancer Sister   . Heart disease Brother     Past Surgical History:  Procedure Laterality Date  . ABDOMINAL HYSTERECTOMY     TAH BSO  . anal fistula repair    . CARDIAC CATHETERIZATION  2011 Dec.   stent mid right coronary,Primus element stent  . CATARACT  EXTRACTION    . COLPOSCOPY    . OOPHORECTOMY     BSO  . OTHER SURGICAL HISTORY     hysterectomy  . RIGHT/LEFT HEART CATH AND CORONARY ANGIOGRAPHY N/A 07/26/2016   Procedure: Right/Left Heart Cath and Coronary Angiography;  Surgeon: Jolaine Artist, MD;  Location: [facility];  Service: Cardiovascular;  Laterality: N/A;   Social History   Occupational History  . Occupation: Retired    Fish farm manager: RETIRED  Tobacco Use  . Smoking status: Former Smoker    Packs/day: 0.50    Years: 47.00    Pack years: 23.50    Types: Cigarettes    Last attempt to quit: 07/20/2016    Years since quitting: 0.9  . Smokeless tobacco: Never Used  Substance and Sexual Activity  . Alcohol use: Yes    Alcohol/week: 0.0 oz    Comment: Rare  . Drug use: No  . Sexual activity: Never    Birth control/protection: Post-menopausal, Surgical    Comment: HYST-1st intercourse 72 yo-More than 5 partners

## 2017-06-19 ENCOUNTER — Ambulatory Visit: Payer: Medicare Other | Admitting: Internal Medicine

## 2017-06-19 ENCOUNTER — Encounter (HOSPITAL_COMMUNITY)
Admission: RE | Admit: 2017-06-19 | Discharge: 2017-06-19 | Disposition: A | Discharge status: Home / Self Care | Payer: Self-pay | Source: Ambulatory Visit | Attending: Internal Medicine | Admitting: Internal Medicine

## 2017-06-21 ENCOUNTER — Telehealth (HOSPITAL_COMMUNITY): Payer: Self-pay | Admitting: Family Medicine

## 2017-06-21 ENCOUNTER — Encounter (HOSPITAL_COMMUNITY): Payer: Self-pay

## 2017-06-25 ENCOUNTER — Encounter: Payer: Self-pay | Admitting: Internal Medicine

## 2017-06-25 ENCOUNTER — Ambulatory Visit: Payer: Medicare Other | Admitting: Internal Medicine

## 2017-06-25 VITALS — BP 124/66 | HR 85 | Ht 59.0 in | Wt 160.0 lb

## 2017-06-25 DIAGNOSIS — J9611 Chronic respiratory failure with hypoxia: Secondary | ICD-10-CM

## 2017-06-25 DIAGNOSIS — I2781 Cor pulmonale (chronic): Secondary | ICD-10-CM | POA: Diagnosis not present

## 2017-06-25 DIAGNOSIS — J449 Chronic obstructive pulmonary disease, unspecified: Secondary | ICD-10-CM

## 2017-06-25 NOTE — Patient Instructions (Signed)
Goal is to keep the 02 saturation above 90%   Work on inhaler technique:  relax and gently blow all the way out then take a nice smooth deep breath back in, triggering the inhaler at same time you start breathing in.  Hold for up to 5 seconds if you can. Blow out thru nose. Rinse and gargle with water when done      Plan A = Automatic = Anoro one click  Plan B = Backup Only use your albuterol (Proair) as a rescue medication to be used if you can't catch your breath by resting or doing a relaxed purse lip breathing pattern.  - The less you use it, the better it will work when you need it. - Ok to use the inhaler up to 2 puffs  every 4 hours if you must but call for appointment if use goes up over your usual need - Don't leave home without it !!  (think of it like the spare tire for your car)       Please schedule a follow up visit in 12 months but call sooner if needed

## 2017-06-25 NOTE — Progress Notes (Signed)
Subjective:   Patient ID: Deanna Johnston, female    DOB: [birth year]   MRN: [id number]    Brief patient profile:  44  yowf quit smoking 07/2016  with tendency to rhinitis/cough x adulthood better since around 2000 on one tsp honey per day then cough started early Dec 2014 and persisted so referred 03/11/2013 by Lady Deutscher proved to have probable acei cough and unrelated  COPD GOLD II documented 04/14/2013     History of Present Illness    03/11/2013 1st [facility] office visit/ Willliam Pettet on ACEi cc persistent cough x 8 weeks she attributes to getting the pneumonia booster more day > night mostly  non-productive  And assoc with sense of pnds / throat tickle.  Does not wake her from sleep and not aware of it until after stirs in am. Already tried abx and inhalers not helping rec The key is to stop smoking completely before smoking completely stops you - this is the most important aspect of your care Stop lisinopril one daily  Start diovan 80 mg one daily      04/14/2013 f/u ov/Deanna Johnston re: GOLD II COPD/ still smoking Cc cough now mostly at hs now whereas was more all day before/ somewhat rattling features, but no excess or purulent sputum production. Not limited by breathing from desired activities  - no need for saba   rec You have GOLD II COPD, the only way to keep it from progressing is to stop smoking now. If the cough starts bothering you again: Pantoprazole (protonix) 40 mg   Take 30-60 min before first meal of the day and Pepcid 20 mg one bedtime until return to office - this is the best way to tell whether stomach acid is contributing to your problem.   GERD diet Please schedule a follow up office visit in 4 weeks, sooner if needed > did not return as rec    Admit date: 07/20/2016 Discharge date: 07/29/2016  Discharge Diagnoses:  Principal Problem:   Acute respiratory failure with hypoxia and hypercapnia (HCC) Active Problems:   Acute on chronic heart failure (HCC)   Cardiogenic shock  (HCC)   Tobacco dependence   Hyperlipidemia   Essential hypertension, benign   Pressure injury of skin   Hypotension   Right ventricular failure (HCC)   PAH (pulmonary artery hypertension) ([place])   Hypoxia   History of present illness:  Per Dr. Corrie Dandy 72 year old female with PMH of COPD (Active smoker, 40-50 year 2 ppd, reports cut back to 1/2 ppd), CAD s/p stents in 2011, HLD, MI. Reports at baseline is SOB with chronic cough.   Presents to ED on 6/7 with progressive dyspnea for the last 2 weeks with increased swelling to BLE. Upon arrival to ED BNP 1113.9, WBC 11.6, Lactic 1.45, ABG 7.3/60/63. Hypotensive with systolic 53-97, oxygenation 90s on 4L , received 500 ml bolus and Rocephin/Azithromycin. Cardiology was consulted who recommended diuresis. PCCM asked to admit.   Hospital Course:  #1 acute on chronic hypoxic/hypercarbic respiratory failure Concern for possible acute exacerbation of COPD and acute on chronic right ventricular heart failure. Patient was on the critical care service initially required BiPAP and subsequently weaned down to high flow nasal 6 L. Patient with clinical improvement. It was felt by critical care medicine the patient was likely hypoxemic for years and will likely need home O2 upon discharge. BiPAP was subsequently discontinued. Patient will was also diuresed with IV diuretics and transitioned to oral Lasix per cardiology.  Patient was -10.8 L during this hospitalization. Patient status post antibiotics. Patient also placed on DuoNeb's per pulmonary recommendations and will be discharged on DuoNeb's. Patient improved clinically. Oxygen on room air and ambulation with checked and patient was noted to have sats of 85% on room air. Patient be discharged home on oxygen 3 L nasal cannula. Patient is to follow-up with pulmonary and cardiology in the outpatient setting.  #2 shock secondary to right ventricular failure likely secondary to cor pulmonale  andobstructive sleep apnea 2-D echo 07/21/2016 with a EF of 65-70% with grade 1 diastolic dysfunction, moderate to severely reduced right ventricular systolic function. Dopamine which was initially started due to shock was subsequently discontinued as patient's blood pressure improved. Patient status post right and left heart catheterization 07/26/2016 with EF of 65%, 60% distal circumflex lesion, 20% proximal LAD to mid LAD lesion, 30% distal RCA lesion, 20% stenosed mid RCA lesion. Patient subsequently underwent cardiac MRI. Is felt per pulmonary and cardiology that patient would likely need home O2 and outpatient sleep study. Patient's blood pressure stabilized after dopamine was discontinued. Patient's antihypertensive medications were held due to borderline blood pressure was not resumed on discharge. Patient was discharged on oral Lasix 40 mg daily and is to follow-up with cardiology and pulmonary outpatient setting. Patient will be set up by pulmonary for sleep study.   #3 hyperlipidemia Patient was placed on a statin during the hospitalization be discharged on a statin.  #4 probable obstructive sleep apnea Patient will need outpatient sleep study. Pulmonary to set up as an outpatient.  #6 acute kidney injury Resolved.  #7 coronary artery disease Stable. Cardiac medications of metoprolol and ARB were held due to borderline blood pressure and not resumed on discharge. Patient will follow-up with cardiology in the outpatient setting.     NP ov  08/17/16  Begin Anoro 1 puff daily . Rinse after use> stopped after one week   [place] job not smoking . Keep up good work . Marland Kitchen  Decrease Oxygen 2l/m .  Order for POC oxygen device.    09/25/2016  Extended f/u ov/Deanna Johnston re: re-establish for copd management after failing to f/u in 04/2013 as above  Chief Complaint  Patient presents with  . Follow-up    review pft.  pt states she is doing well, denies any current breathing complaints.     thoroughly confused between maint and prn, not clear did better on anoro as never changed saba to prn  Doe = MMRC2 = can't walk a nl pace on a flat grade s sob but does fine slow and flat eg [employer] shopping on 2lpm does not titrate (has meter, doesn't use with activity) rec When you walk keep saturations above 90%  - same goal when sitting. Plan A = Automatic =  Anoro one click each am  - 2 good drags  Plan B = Backup Only use your albuterol as a rescue medication  Please see patient coordinator before you leave today  to schedule ambulatory 02 concentrator eligbility        12/26/2016  f/u ov/Deanna Johnston re:  Copd GOLD   II / maint on Anoro/ no need for neb / using 02 2lpm hs and prn daytime Chief Complaint  Patient presents with  . Follow-up    Breathing is doing well. She does not have a rescue inhaler and requests that we prescribe one.   doe x MMRC2 = can't walk a nl pace on a flat grade s sob but does  fine slow and flat eg shopping on 02 No need for any saba at all and want to get rid of neb rec Goal is to keeps sats over 90% at all times so keep on the 2lpm at bedtime and adjust during the day to keep over 90% depending on your level of activity. Plan A = Automatic = Anoro one click each am or nebulizer up to 4 x daily to use it up  Plan B = Backup Only use your albuterol (Proair) as a rescue medication    06/25/2017  f/u ov/Deanna Johnston re:   GOLD II/ copd 02 hs and prn daytime  Chief Complaint  Patient presents with  . Follow-up    Breathing is overall doing well. She rarely has to use her albuterol inhaler.    Dyspnea:  MMRC2 = can't walk a nl pace on a flat grade s sob but does fine slow and flat on 2lpm NP pushing grocery cart anywhere she wants to go  Cough: no Sleep: ok 2lpm horizontal  SABA use: none At rest usually sats 95% RA    No obvious day to day or daytime variability or assoc excess/ purulent sputum or mucus plugs or hemoptysis or cp or chest tightness, subjective  wheeze or overt sinus or hb symptoms. No unusual exposure hx or h/o childhood pna/ asthma or knowledge of premature birth.  Sleeping  Ok horizontal on 2lpm  without nocturnal  or early am exacerbation  of respiratory  c/o's or need for noct saba. Also denies any obvious fluctuation of symptoms with weather or environmental changes or other aggravating or alleviating factors except as outlined above   Current Allergies, Complete Past Medical History, Past Surgical History, Family History, and Social History were reviewed in [employer] record.  ROS  The following are not active complaints unless bolded Hoarseness, sore throat, dysphagia, dental problems, itching, sneezing,  nasal congestion or discharge of excess mucus or purulent secretions, ear ache,   fever, chills, sweats, unintended wt loss or wt gain, classically pleuritic or exertional cp,  orthopnea pnd or arm/hand swelling  or leg swelling, presyncope, palpitations, abdominal pain, anorexia, nausea, vomiting, diarrhea  or change in bowel habits or change in bladder habits, change in stools or change in urine, dysuria, hematuria,  rash, arthralgias, visual complaints, headache, numbness, weakness or ataxia or problems with walking or coordination,  change in mood or  memory.        Current Meds  Medication Sig  . albuterol (PROAIR HFA) 108 (90 Base) MCG/ACT inhaler 2 puffs every 4 hours as needed only  if your can't catch your breath  . ANORO ELLIPTA 62.5-25 MCG/INH AEPB INHALE 1 PUFF INTO THE LUNGS DAILY  . aspirin EC 81 MG tablet Take 81 mg by mouth daily.  . furosemide (LASIX) 40 MG tablet TAKE 1 TABLET BY MOUTH ONCE DAILY  . nitroGLYCERIN (NITROSTAT) 0.4 MG SL tablet PLACE 1 TABLET UNDER THE TONGUE EVERY 5 MINUTES AS NEEDED FOR CHEST PAIN  . OXYGEN 2lpm with sleep and with exertion AHC  . potassium chloride SA (K-DUR,KLOR-CON) 20 MEQ tablet Take 1 tablet (20 mEq total) by mouth daily.                         Objective:   Physical Exam  amb wf wearing L wrist splint   04/14/2013     153 > 09/25/2016   155 > 12/26/2016     160 >  06/25/2017  160     03/11/13 153 lb 12.8 oz (69.763 kg)  09/04/12 159 lb (72.122 kg)  08/02/12 158 lb (71.668 kg)     Vital signs reviewed - Note on arrival 02 sats  95% on 2lpm POC      HEENT: nl dentition / oropharynx. Nl external ear canals without cough reflex - moderate bilateral non-specific turbinate edema     NECK :  without JVD/Nodes/TM/ nl carotid upstrokes bilaterally   LUNGS: no acc muscle use,  Mod barrel  contour chest wall with bilateral  Distant bs s audible wheeze and  without cough on insp or exp maneuver and mod   Hyperresonant  to  percussion bilaterally     CV:  RRR  no s3 or murmur or increase in P2, and no edema   ABD:  Mildly obese soft and nontender with pos mid insp Hoover's  in the supine position. No bruits or organomegaly appreciated, bowel sounds nl  MS:   Nl gait/  ext warm without deformities, calf tenderness, cyanosis or clubbing No obvious joint restrictions   SKIN: warm and dry without lesions    NEURO:  alert, approp, nl sensorium with  no motor or cerebellar deficits apparent.                          Assessment & Plan:

## 2017-06-25 NOTE — Assessment & Plan Note (Signed)
No longer needs any saba neb at all, doing great on anoro but no concept of rescue rx - luckily not needed yet.

## 2017-06-25 NOTE — Assessment & Plan Note (Signed)
-   PFTs 04/14/2013  FEV1  1.03 (59%) ratio 69 and no better with dlco 66% - PFT's  09/25/2016  FEV1 1.16  (71 % ) ratio 64  p no % improvement from saba p neb w/in 4 h  prior to study with DLCO  58/52  % corrects to 62 % for alv volume   - 09/25/2016  After extensive coaching  Device  effectiveness =    90% vs 0 % baseline (blew out first) on elipta device so rechallenge with anoro each am   - 06/25/2017  After extensive coaching inhaler device  effectiveness =    75% (TI short)     Pt is Group B in terms of symptom/risk and laba/lama therefore appropriate rx at this point = continue anoro   No longer needs any saba neb at all, doing great on anoro but no concept of rescue rx - luckily not needed yet.   I had an extended discussion with the patient reviewing all relevant studies completed to date and  lasting 15 to 20 minutes of a 25 minute visit    See device teaching which extended face to face time for this visit   Each maintenance medication was reviewed in detail including most importantly the difference between maintenance and prns and under what circumstances the prns are to be triggered using an action plan format that is not reflected in the computer generated alphabetically organized AVS.    Please see AVS for specific instructions unique to this visit that I personally wrote and verbalized to the the pt in detail and then reviewed with pt  by my nurse highlighting any  changes in therapy recommended at today's visit to their plan of care.

## 2017-06-25 NOTE — Assessment & Plan Note (Signed)
Done on dopamine 59mcg/kg/min on 07/26/16   Ao = 110/47 (72) LV = 120/10 RA = 5 RV = 47/7 PA = 42/16 (27) PCW = 6 Fick cardiac output/index = 5.7/3.4 PVR = 4.2 WU SVR 945 Ao sat =  88% PA sat = 71%, 69% High SVC sat = 69%   Repeat echo  11/17/16  Left ventricle: The cavity size was normal. Systolic function was   normal. The estimated ejection fraction was in the range of 55%   to 60%. Wall motion was normal; there were no regional wall   motion abnormalities. Doppler parameters are consistent with   abnormal left ventricular relaxation (grade 1 diastolic   dysfunction). - Aortic valve: Transvalvular velocity was within the normal range.   There was no stenosis. There was no regurgitation. - Mitral valve: Transvalvular velocity was within the normal range.   There was no evidence for stenosis. There was no regurgitation. - Right ventricle: The cavity size was normal. Wall thickness was   normal. Systolic function was normal. - Atrial septum: No defect or patent foramen ovale was identified. - Tricuspid valve: There was trivial regurgitation. - Pulmonary arteries: Systolic pressure was within the normal   range.   No clinical evidence of evolving cor pulmonale now than on 02 with adequate sats > no change rx

## 2017-06-26 ENCOUNTER — Encounter (HOSPITAL_COMMUNITY)
Admission: RE | Admit: 2017-06-26 | Discharge: 2017-06-26 | Disposition: A | Discharge status: Home / Self Care | Payer: Self-pay | Source: Ambulatory Visit | Attending: Internal Medicine | Admitting: Internal Medicine

## 2017-06-28 ENCOUNTER — Encounter (HOSPITAL_COMMUNITY)
Admission: RE | Admit: 2017-06-28 | Discharge: 2017-06-28 | Disposition: A | Discharge status: Home / Self Care | Payer: Medicare Other | Source: Ambulatory Visit | Attending: Internal Medicine | Admitting: Internal Medicine

## 2017-07-03 ENCOUNTER — Encounter (HOSPITAL_COMMUNITY)
Admission: RE | Admit: 2017-07-03 | Discharge: 2017-07-03 | Disposition: A | Discharge status: Home / Self Care | Payer: Self-pay | Source: Ambulatory Visit | Attending: Internal Medicine | Admitting: Internal Medicine

## 2017-07-05 ENCOUNTER — Encounter (HOSPITAL_COMMUNITY)
Admission: RE | Admit: 2017-07-05 | Discharge: 2017-07-05 | Disposition: A | Discharge status: Home / Self Care | Payer: Self-pay | Source: Ambulatory Visit | Attending: Internal Medicine | Admitting: Internal Medicine

## 2017-07-06 ENCOUNTER — Telehealth (HOSPITAL_COMMUNITY): Payer: Self-pay | Admitting: *Deleted

## 2017-07-06 NOTE — Telephone Encounter (Signed)
Pt dropped off form for disability parking, form completed and signed by Dr Haroldine Laws, mailed to pt per request

## 2017-07-10 ENCOUNTER — Encounter (HOSPITAL_COMMUNITY)
Admission: RE | Admit: 2017-07-10 | Discharge: 2017-07-10 | Disposition: A | Discharge status: Home / Self Care | Payer: Self-pay | Source: Ambulatory Visit | Attending: Internal Medicine | Admitting: Internal Medicine

## 2017-07-12 ENCOUNTER — Encounter (HOSPITAL_COMMUNITY)
Admission: RE | Admit: 2017-07-12 | Discharge: 2017-07-12 | Disposition: A | Discharge status: Home / Self Care | Payer: Medicare Other | Source: Ambulatory Visit | Attending: Internal Medicine | Admitting: Internal Medicine

## 2017-07-17 ENCOUNTER — Encounter (HOSPITAL_COMMUNITY)
Admission: RE | Admit: 2017-07-17 | Discharge: 2017-07-17 | Disposition: A | Discharge status: Home / Self Care | Payer: Self-pay | Source: Ambulatory Visit | Attending: Internal Medicine | Admitting: Internal Medicine

## 2017-07-17 DIAGNOSIS — I50810 Right heart failure, unspecified: Secondary | ICD-10-CM | POA: Insufficient documentation

## 2017-07-18 ENCOUNTER — Ambulatory Visit (INDEPENDENT_AMBULATORY_CARE_PROVIDER_SITE_OTHER): Payer: Medicare Other | Admitting: Physician Assistant

## 2017-07-18 ENCOUNTER — Encounter (INDEPENDENT_AMBULATORY_CARE_PROVIDER_SITE_OTHER): Payer: Self-pay | Admitting: Physician Assistant

## 2017-07-18 DIAGNOSIS — S62102D Fracture of unspecified carpal bone, left wrist, subsequent encounter for fracture with routine healing: Secondary | ICD-10-CM

## 2017-07-18 NOTE — Progress Notes (Signed)
Deanna Johnston returns today follow-up of her left wrist fracture.  She denies any pain in the wrist.  She is been going without the brace for at least last 5 days.  She states she feels that the wrist is well-healed.  She is now approximately 10 weeks status post fracture.  She states that she does not want any radiographs of the wrist today.  Physical exam: Left wrist no gross deformity.  She has good dorsiflexion volar flexion of the wrist without pain.  She is nontender over the distal wrist today.  There is no rashes skin lesions ulcerations erythema about the wrist.  Impression: Closed fracture left wrist 10 weeks status post conservative treatment  Plan: This point time she will follow-up with [place] on an as-needed basis.  She is activities as tolerated.

## 2017-07-19 ENCOUNTER — Encounter (HOSPITAL_COMMUNITY)
Admission: RE | Admit: 2017-07-19 | Discharge: 2017-07-19 | Disposition: A | Discharge status: Home / Self Care | Payer: Self-pay | Source: Ambulatory Visit | Attending: Internal Medicine | Admitting: Internal Medicine

## 2017-07-24 ENCOUNTER — Encounter (HOSPITAL_COMMUNITY)
Admission: RE | Admit: 2017-07-24 | Discharge: 2017-07-24 | Disposition: A | Discharge status: Home / Self Care | Payer: Self-pay | Source: Ambulatory Visit | Attending: Internal Medicine | Admitting: Internal Medicine

## 2017-07-26 ENCOUNTER — Encounter (HOSPITAL_COMMUNITY)
Admission: RE | Admit: 2017-07-26 | Discharge: 2017-07-26 | Disposition: A | Discharge status: Home / Self Care | Payer: Self-pay | Source: Ambulatory Visit | Attending: Internal Medicine | Admitting: Internal Medicine

## 2017-07-30 ENCOUNTER — Other Ambulatory Visit: Payer: Self-pay | Admitting: Cardiology

## 2017-07-31 ENCOUNTER — Telehealth (HOSPITAL_COMMUNITY): Payer: Self-pay | Admitting: Family Medicine

## 2017-07-31 ENCOUNTER — Encounter (HOSPITAL_COMMUNITY): Payer: Self-pay

## 2017-08-02 ENCOUNTER — Encounter (HOSPITAL_COMMUNITY): Payer: Self-pay

## 2017-08-07 ENCOUNTER — Encounter (HOSPITAL_COMMUNITY)
Admission: RE | Admit: 2017-08-07 | Discharge: 2017-08-07 | Disposition: A | Discharge status: Home / Self Care | Payer: Medicare Other | Source: Ambulatory Visit | Attending: Internal Medicine | Admitting: Internal Medicine

## 2017-08-09 ENCOUNTER — Encounter (HOSPITAL_COMMUNITY)
Admission: RE | Admit: 2017-08-09 | Discharge: 2017-08-09 | Disposition: A | Discharge status: Home / Self Care | Payer: Self-pay | Source: Ambulatory Visit | Attending: Internal Medicine | Admitting: Internal Medicine

## 2017-08-14 ENCOUNTER — Encounter (HOSPITAL_COMMUNITY): Payer: Self-pay

## 2017-08-14 DIAGNOSIS — I50810 Right heart failure, unspecified: Secondary | ICD-10-CM | POA: Insufficient documentation

## 2017-08-15 ENCOUNTER — Encounter: Payer: Self-pay | Admitting: Gynecology

## 2017-08-15 ENCOUNTER — Ambulatory Visit: Payer: Medicare Other | Admitting: Gynecology

## 2017-08-15 VITALS — BP 130/74 | Ht <= 58 in | Wt 162.0 lb

## 2017-08-15 DIAGNOSIS — N893 Dysplasia of vagina, unspecified: Secondary | ICD-10-CM | POA: Diagnosis not present

## 2017-08-15 DIAGNOSIS — Z01419 Encounter for gynecological examination (general) (routine) without abnormal findings: Secondary | ICD-10-CM

## 2017-08-15 DIAGNOSIS — N952 Postmenopausal atrophic vaginitis: Secondary | ICD-10-CM

## 2017-08-15 DIAGNOSIS — Z1272 Encounter for screening for malignant neoplasm of vagina: Secondary | ICD-10-CM

## 2017-08-15 MED ORDER — NYSTATIN 100000 UNIT/GM EX CREA: 1.0000 "application " | TOPICAL_CREAM | Freq: Two times a day (BID) | CUTANEOUS | 3 refills | Status: DC

## 2017-08-15 NOTE — Patient Instructions (Signed)
Follow-up in 1 year for annual exam, sooner if any issues. 

## 2017-08-15 NOTE — Addendum Note (Signed)
Addended by: Nelva Nay on: 08/15/2017 04:23 PM   Modules accepted: Orders

## 2017-08-15 NOTE — Progress Notes (Signed)
    Deanna Johnston 1945/07/05 [id number]        72 y.o.  G0P0 for annual gynecologic exam.  Without gynecologic complaints.  Past medical history,surgical history, problem list, medications, allergies, family history and social history were all reviewed and documented as reviewed in the EPIC chart.  ROS:  Performed with pertinent positives and negatives included in the history, assessment and plan.   Additional significant findings : None   Exam: Caryn Bee assistant Vitals:   08/15/17 1518  BP: 130/74  Weight: 162 lb (73.5 kg)  Height: 4\' 8"  (1.422 m)   Body mass index is 36.32 kg/m.  General appearance:  Normal affect, orientation and appearance. Skin: Grossly normal excepting classic fungal rash in the folds of her panniculus HEENT: Without gross lesions.  No cervical or supraclavicular adenopathy. Thyroid normal.  Lungs:  Clear without wheezing, rales or rhonchi Cardiac: RR, without RMG Abdominal:  Soft, nontender, without masses, guarding, rebound, organomegaly or hernia.  Classic fungal rash in the folds of her panniculus Breasts:  Examined lying and sitting without masses, retractions, discharge or axillary adenopathy. Pelvic:  Ext, BUS, Vagina: With atrophic changes.  Pap smear of vaginal cuff done  Adnexa: Without masses or tenderness    Anus and perineum: Normal   Rectovaginal: Normal sphincter tone without palpated masses or tenderness.    Assessment/Plan:  72 y.o. G0P0 female for annual gynecologic exam.   1. Postmenopausal/atrophic genital changes.  Status post TAH/BSO in the past.  Without menopausal symptoms. 2. Skin rash in her panniculus.  Classic for yeast.  Has used Mycostatin cream in the past and done well with this.  30 g tube with 3 refills provided. 3. Osteoporosis.  DEXA 2011 T score -3.  With discussed previously treatment options and she has always declined.  She relates having fallen and broken her wrist this past year.  I again reviewed with her  her significant increased risk of fracture and the potential for devastating outcomes.  Patient states she would never take medication and declines any further testing.  She clearly understands her risks of fracture. 4. Mammography 01/2017.  Continue with annual mammography when due.  Breast exam normal today. 5. Pap smear 2017.  Pap smear of vaginal cuff today.  History of VAIN in the past  To include being treated with Efudex.  Most recent Pap smears have been normal. 6. Colon Screening.  Was unable to complete a colonoscopy.  She will continue to follow-up with her primary physician and their recommendations as far as colon screening. 7. Health maintenance.  No routine lab work done as patient does this elsewhere.  Follow-up 1 year, sooner as needed.   Anastasio Auerbach MD, 3:43 PM 08/15/2017

## 2017-08-17 LAB — PAP IG W/ RFLX HPV ASCU

## 2017-08-21 ENCOUNTER — Encounter (HOSPITAL_COMMUNITY)
Admission: RE | Admit: 2017-08-21 | Discharge: 2017-08-21 | Disposition: A | Discharge status: Home / Self Care | Payer: Self-pay | Source: Ambulatory Visit | Attending: Internal Medicine | Admitting: Internal Medicine

## 2017-08-23 ENCOUNTER — Encounter (HOSPITAL_COMMUNITY)
Admission: RE | Admit: 2017-08-23 | Discharge: 2017-08-23 | Disposition: A | Discharge status: Home / Self Care | Payer: Self-pay | Source: Ambulatory Visit | Attending: Internal Medicine | Admitting: Internal Medicine

## 2017-08-28 ENCOUNTER — Encounter (HOSPITAL_COMMUNITY): Payer: Self-pay

## 2017-08-30 ENCOUNTER — Encounter (HOSPITAL_COMMUNITY)
Admission: RE | Admit: 2017-08-30 | Discharge: 2017-08-30 | Disposition: A | Discharge status: Home / Self Care | Payer: Self-pay | Source: Ambulatory Visit | Attending: Internal Medicine | Admitting: Internal Medicine

## 2017-09-04 ENCOUNTER — Encounter (HOSPITAL_COMMUNITY)
Admission: RE | Admit: 2017-09-04 | Discharge: 2017-09-04 | Disposition: A | Discharge status: Home / Self Care | Payer: Self-pay | Source: Ambulatory Visit | Attending: Internal Medicine | Admitting: Internal Medicine

## 2017-09-06 ENCOUNTER — Encounter (HOSPITAL_COMMUNITY)
Admission: RE | Admit: 2017-09-06 | Discharge: 2017-09-06 | Disposition: A | Discharge status: Home / Self Care | Payer: Self-pay | Source: Ambulatory Visit | Attending: Internal Medicine | Admitting: Internal Medicine

## 2017-09-06 ENCOUNTER — Telehealth: Payer: Self-pay | Admitting: Internal Medicine

## 2017-09-06 NOTE — Telephone Encounter (Signed)
Called and spoke with Thayer Headings and she will pass the message on to [facility] to have her return our call.

## 2017-09-10 NOTE — Telephone Encounter (Signed)
[facility] returned call, [phone].

## 2017-09-10 NOTE — Telephone Encounter (Signed)
Attempted to call Cloyde Reams with [facility] pulmonary rehab center at phone [phone]. I did not receive an answer at time of call. I have left a voicemail message for pt to return call. X1

## 2017-09-10 NOTE — Telephone Encounter (Signed)
Called to speak with Cloyde Reams with Pulm Rehab at [phone]; she's currently with a pt Cloyde Reams will call back once completed with pt Will f/u later today

## 2017-09-11 ENCOUNTER — Encounter (HOSPITAL_COMMUNITY): Payer: Self-pay

## 2017-09-11 NOTE — Telephone Encounter (Signed)
Called to speak with Deanna Johnston with Pulm Rehab at [phone]; she's currently with a pt Deanna Johnston will call back once completed with pt; per Thayer Headings in pulm rehab. Will f/u later today

## 2017-09-11 NOTE — Telephone Encounter (Signed)
ATC Molly at [phone], pulmonary rehab.  Cloyde Reams will be out of the office Wednesday.  LMTCB for Molly to call on Thursday, once she is back in office.

## 2017-09-11 NOTE — Telephone Encounter (Signed)
Molly returned phone call, will be out of office on Wednesday; will be at office Thursday morning.Marland Kitchen

## 2017-09-12 NOTE — Telephone Encounter (Signed)
Sent to scheduling, notes on file.

## 2017-09-13 ENCOUNTER — Encounter (HOSPITAL_COMMUNITY): Payer: Self-pay

## 2017-09-13 DIAGNOSIS — I50810 Right heart failure, unspecified: Secondary | ICD-10-CM | POA: Insufficient documentation

## 2017-09-18 ENCOUNTER — Encounter (HOSPITAL_COMMUNITY)
Admission: RE | Admit: 2017-09-18 | Discharge: 2017-09-18 | Disposition: A | Discharge status: Home / Self Care | Payer: Medicare Other | Source: Ambulatory Visit | Attending: Internal Medicine | Admitting: Internal Medicine

## 2017-09-19 ENCOUNTER — Other Ambulatory Visit (HOSPITAL_COMMUNITY): Payer: Self-pay | Admitting: Internal Medicine

## 2017-09-20 ENCOUNTER — Encounter (HOSPITAL_COMMUNITY)
Admission: RE | Admit: 2017-09-20 | Discharge: 2017-09-20 | Disposition: A | Discharge status: Home / Self Care | Payer: Medicare Other | Source: Ambulatory Visit | Attending: Internal Medicine | Admitting: Internal Medicine

## 2017-09-25 ENCOUNTER — Encounter (HOSPITAL_COMMUNITY)
Admission: RE | Admit: 2017-09-25 | Discharge: 2017-09-25 | Disposition: A | Discharge status: Home / Self Care | Payer: Self-pay | Source: Ambulatory Visit | Attending: Internal Medicine | Admitting: Internal Medicine

## 2017-09-27 ENCOUNTER — Encounter (HOSPITAL_COMMUNITY): Payer: Self-pay

## 2017-10-01 ENCOUNTER — Other Ambulatory Visit: Payer: Self-pay | Admitting: Internal Medicine

## 2017-10-02 ENCOUNTER — Encounter (HOSPITAL_COMMUNITY)
Admission: RE | Admit: 2017-10-02 | Discharge: 2017-10-02 | Disposition: A | Discharge status: Home / Self Care | Payer: Self-pay | Source: Ambulatory Visit | Attending: Internal Medicine | Admitting: Internal Medicine

## 2017-10-04 ENCOUNTER — Encounter (HOSPITAL_COMMUNITY)
Admission: RE | Admit: 2017-10-04 | Discharge: 2017-10-04 | Disposition: A | Discharge status: Home / Self Care | Payer: Self-pay | Source: Ambulatory Visit | Attending: Internal Medicine | Admitting: Internal Medicine

## 2017-10-09 ENCOUNTER — Encounter (HOSPITAL_COMMUNITY)
Admission: RE | Admit: 2017-10-09 | Discharge: 2017-10-09 | Disposition: A | Discharge status: Home / Self Care | Payer: Medicare Other | Source: Ambulatory Visit | Attending: Internal Medicine | Admitting: Internal Medicine

## 2017-10-11 ENCOUNTER — Encounter (HOSPITAL_COMMUNITY)
Admission: RE | Admit: 2017-10-11 | Discharge: 2017-10-11 | Disposition: A | Discharge status: Home / Self Care | Payer: Medicare Other | Source: Ambulatory Visit | Attending: Internal Medicine | Admitting: Internal Medicine

## 2017-10-18 ENCOUNTER — Encounter (HOSPITAL_COMMUNITY)
Admission: RE | Admit: 2017-10-18 | Discharge: 2017-10-18 | Disposition: A | Discharge status: Home / Self Care | Payer: Self-pay | Source: Ambulatory Visit | Attending: Internal Medicine | Admitting: Internal Medicine

## 2017-10-18 DIAGNOSIS — I50810 Right heart failure, unspecified: Secondary | ICD-10-CM | POA: Insufficient documentation

## 2017-10-30 ENCOUNTER — Encounter (HOSPITAL_COMMUNITY)
Admission: RE | Admit: 2017-10-30 | Discharge: 2017-10-30 | Disposition: A | Discharge status: Home / Self Care | Payer: Self-pay | Source: Ambulatory Visit | Attending: Internal Medicine | Admitting: Internal Medicine

## 2017-11-06 ENCOUNTER — Encounter (HOSPITAL_COMMUNITY)
Admission: RE | Admit: 2017-11-06 | Discharge: 2017-11-06 | Disposition: A | Discharge status: Home / Self Care | Payer: Self-pay | Source: Ambulatory Visit | Attending: Internal Medicine | Admitting: Internal Medicine

## 2017-11-07 ENCOUNTER — Other Ambulatory Visit: Payer: Self-pay | Admitting: Internal Medicine

## 2017-11-08 ENCOUNTER — Encounter (HOSPITAL_COMMUNITY)
Admission: RE | Admit: 2017-11-08 | Discharge: 2017-11-08 | Disposition: A | Discharge status: Home / Self Care | Payer: Self-pay | Source: Ambulatory Visit | Attending: Internal Medicine | Admitting: Internal Medicine

## 2017-11-13 ENCOUNTER — Encounter (HOSPITAL_COMMUNITY)
Admission: RE | Admit: 2017-11-13 | Discharge: 2017-11-13 | Disposition: A | Discharge status: Home / Self Care | Payer: Self-pay | Source: Ambulatory Visit | Attending: Internal Medicine | Admitting: Internal Medicine

## 2017-11-13 DIAGNOSIS — I50810 Right heart failure, unspecified: Secondary | ICD-10-CM | POA: Insufficient documentation

## 2017-11-14 ENCOUNTER — Encounter (HOSPITAL_COMMUNITY): Payer: Medicare Other | Admitting: Internal Medicine

## 2017-11-15 ENCOUNTER — Ambulatory Visit (HOSPITAL_COMMUNITY)
Admission: RE | Admit: 2017-11-15 | Discharge: 2017-11-15 | Disposition: A | Discharge status: Home / Self Care | Payer: Medicare Other | Source: Ambulatory Visit | Attending: Internal Medicine | Admitting: Internal Medicine

## 2017-11-15 ENCOUNTER — Encounter (HOSPITAL_COMMUNITY)
Admission: RE | Admit: 2017-11-15 | Discharge: 2017-11-15 | Disposition: A | Discharge status: Home / Self Care | Payer: Self-pay | Source: Ambulatory Visit | Attending: Internal Medicine | Admitting: Internal Medicine

## 2017-11-15 VITALS — BP 168/82 | HR 93 | Wt 161.6 lb

## 2017-11-15 DIAGNOSIS — J42 Unspecified chronic bronchitis: Secondary | ICD-10-CM

## 2017-11-15 DIAGNOSIS — Z9981 Dependence on supplemental oxygen: Secondary | ICD-10-CM | POA: Diagnosis not present

## 2017-11-15 DIAGNOSIS — J9611 Chronic respiratory failure with hypoxia: Secondary | ICD-10-CM | POA: Diagnosis not present

## 2017-11-15 DIAGNOSIS — Z7982 Long term (current) use of aspirin: Secondary | ICD-10-CM | POA: Insufficient documentation

## 2017-11-15 DIAGNOSIS — I252 Old myocardial infarction: Secondary | ICD-10-CM | POA: Insufficient documentation

## 2017-11-15 DIAGNOSIS — I2510 Atherosclerotic heart disease of native coronary artery without angina pectoris: Secondary | ICD-10-CM | POA: Diagnosis not present

## 2017-11-15 DIAGNOSIS — Z87891 Personal history of nicotine dependence: Secondary | ICD-10-CM | POA: Diagnosis not present

## 2017-11-15 DIAGNOSIS — J449 Chronic obstructive pulmonary disease, unspecified: Secondary | ICD-10-CM | POA: Diagnosis not present

## 2017-11-15 DIAGNOSIS — I50810 Right heart failure, unspecified: Secondary | ICD-10-CM | POA: Diagnosis present

## 2017-11-15 DIAGNOSIS — E669 Obesity, unspecified: Secondary | ICD-10-CM | POA: Diagnosis not present

## 2017-11-15 DIAGNOSIS — E875 Hyperkalemia: Secondary | ICD-10-CM | POA: Insufficient documentation

## 2017-11-15 DIAGNOSIS — Z79899 Other long term (current) drug therapy: Secondary | ICD-10-CM | POA: Insufficient documentation

## 2017-11-15 DIAGNOSIS — Z8249 Family history of ischemic heart disease and other diseases of the circulatory system: Secondary | ICD-10-CM | POA: Insufficient documentation

## 2017-11-15 DIAGNOSIS — Z955 Presence of coronary angioplasty implant and graft: Secondary | ICD-10-CM | POA: Diagnosis not present

## 2017-11-15 DIAGNOSIS — R609 Edema, unspecified: Secondary | ICD-10-CM | POA: Diagnosis not present

## 2017-11-15 DIAGNOSIS — E785 Hyperlipidemia, unspecified: Secondary | ICD-10-CM | POA: Insufficient documentation

## 2017-11-15 DIAGNOSIS — I110 Hypertensive heart disease with heart failure: Secondary | ICD-10-CM | POA: Diagnosis not present

## 2017-11-15 NOTE — Patient Instructions (Signed)
Medication Instructions:  Your physician recommends that you continue on your current medications as directed. Please refer to the Current Medication list given to you today.   Labwork: None ordered  Testing/Procedures: Your physician has requested that you have an echocardiogram. Echocardiography is a painless test that uses sound waves to create images of your heart. It provides your doctor with information about the size and shape of your heart and how well your heart's chambers and valves are working. This procedure takes approximately one hour. There are no restrictions for this procedure.  Follow-Up: Your physician wants you to follow-up in: 6 months with an echocardiogram. Call back in March to schedule appointments.   Any Other Special Instructions Will Be Listed Below (If Applicable).     If you need a refill on your cardiac medications before your next appointment, please call your pharmacy.

## 2017-11-15 NOTE — Progress Notes (Signed)
HF: Dr Haroldine Laws Pulmonary : Dr Melvyn Novas PCP: Dr Kelton Pillar   ADVANCED HF CLINIC NOTE  HPI: Deanna Johnston) is a 72 year old female with PMH of COPD with ongoing tobacco use, CAD s/p NSTEMI with RCA stents in 2011 and obesity.  Admitted to [facility] on 07/20/16 with progressive dyspnea and LE edema. Upon arrival to ED BNP 1113.9, WBC 11.6, Lactic 1.45, ABG 7.3/60/63. Hypotensive with systolic BPs 34-28, oxygenation 90s on 4L [place], received 500 ml bolus and Rocephin/Azithromycin. Admitted to CCM.   Today she returns for HF follow up. Overall doing well. SOB with walking short distances, like from waiting room. Only wearing oxygen PRN now and at night per Dr Melvyn Novas. She carries O2 with her in case she needs it. She has BLE edema and wears TED hose. No orthopnea or PND. No CP or dizziness. Continues pulmonary rehab. Taking all medications. SBP 120s at home. Says she has white coat syndrome. Dr Laurann Montana has increased her natrual thyroid medication (unable to tolerate synthroid). Eating some salty foods. Drinks about 74 ounces daily. Weights: 156-160 lbs.  Testing  Echo 07/21/16: LV EF 65-70% with dilated RV and moderate RV HK. Started on dopamine for RV support. CT chest no PE. Diuresed well and renal function improved.  11/2016  LV function remains normal. RV now improved with normal size and function.  Underwent RHC/LHC on 07/26/16   Mid RCA to Dist RCA lesion, 0 %stenosed.  Mid RCA lesion, 20 %stenosed.  Dist RCA lesion, 30 %stenosed.  Dist Cx lesion, 60 %stenosed.  Prox LAD to Mid LAD lesion, 20 %stenosed.  The left ventricular ejection fraction is greater than 65% by visual estimate. Findings: Done on dopamine 35mcg/kg/min Ao = 110/47 (72) LV = 120/10 RA = 5 RV = 47/7 PA = 42/16 (27) PCW = 6 Fick cardiac output/index = 5.7/3.4 PVR = 4.2 WU SVR 945 Ao sat = 88% PA sat = 71%, 69% High SVC sat = 69% Assessment: 1. Heavily calcified coronary arteries with only minimal luminal  CAD. Widely patent RCA stent 2. LVEF 70% without regional wall motion abnormalities 3. Mild PAH with normal cardiac output on low-dose dopamine 4. No intracardiac shunt 4. PAD - central aortic pressure ~76mmHG greater than radial artery pressure or cuff pressure  CMRI done to evaluate RV dysfunction - LVEF 77% No RV infarct. Possible infiltrative process but not classic amyloid process. No RV scar  SPEP UPEP negative. (cMRI reviewed with Dr. Meda Coffee and not felt to have infiltrative process)  SH:  Social History   Socioeconomic History  . Marital status: Single    Spouse name: Not on file  . Number of children: Not on file  . Years of education: Not on file  . Highest education level: Not on file  Occupational History  . Occupation: Retired    Fish farm manager: RETIRED  Social Needs  . Financial resource strain: Not on file  . Food insecurity:    Worry: Not on file    Inability: Not on file  . Transportation needs:    Medical: Not on file    Non-medical: Not on file  Tobacco Use  . Smoking status: Former Smoker    Packs/day: 0.50    Years: 47.00    Pack years: 23.50    Types: Cigarettes    Last attempt to quit: 07/20/2016    Years since quitting: 1.3  . Smokeless tobacco: Never Used  Substance and Sexual Activity  . Alcohol use: Yes  Alcohol/week: 0.0 standard drinks    Comment: Rare  . Drug use: No  . Sexual activity: Never    Birth control/protection: Post-menopausal, Surgical    Comment: HYST-1st intercourse 72 yo-More than 5 partners  Lifestyle  . Physical activity:    Days per week: Not on file    Minutes per session: Not on file  . Stress: Not on file  Relationships  . Social connections:    Talks on phone: Not on file    Gets together: Not on file    Attends religious service: Not on file    Active member of club or organization: Not on file    Attends meetings of clubs or organizations: Not on file    Relationship status: Not on file  . Intimate partner  violence:    Fear of current or ex partner: Not on file    Emotionally abused: Not on file    Physically abused: Not on file    Forced sexual activity: Not on file  Other Topics Concern  . Not on file  Social History Narrative  . Not on file    FH:  Family History  Problem Relation Age of Onset  . Heart failure Mother   . Stomach cancer Mother   . Heart disease Father   . Diabetes Father   . Colon cancer Sister   . Heart disease Brother     Past Medical History:  Diagnosis Date  . Cataract   . Cervical dysplasia   . COPD (chronic obstructive pulmonary disease) ([place])   . Coronary artery disease   . Hyperlipidemia   . MI (myocardial infarction) [facility]) Dec. 2011   stent mid right coronary  . Obesity   . Tobacco dependence   . VAIN (vaginal intraepithelial neoplasia) 2011   Efudex treatment    Current Outpatient Medications  Medication Sig Dispense Refill  . albuterol (PROAIR HFA) 108 (90 Base) MCG/ACT inhaler 2 puffs every 4 hours as needed only  if your can't catch your breath 1 Inhaler 1  . ANORO ELLIPTA 62.5-25 MCG/INH AEPB INHALE 1 PUFF BY MOUTH ONCE DAILY 60 each 11  . aspirin EC 81 MG tablet Take 81 mg by mouth daily.    . furosemide (LASIX) 40 MG tablet TAKE 1 TABLET BY MOUTH ONCE DAILY 90 tablet 0  . nitroGLYCERIN (NITROSTAT) 0.4 MG SL tablet PLACE 1 TABLET UNDER THE TONGUE EVERY 5 MINUTES AS NEEDED FOR CHEST PAIN 25 tablet 0  . nystatin cream (MYCOSTATIN) Apply 1 application topically 2 (two) times daily. 30 g 3  . OXYGEN 2lpm with sleep and with exertion AHC    . potassium chloride SA (K-DUR,KLOR-CON) 20 MEQ tablet TAKE 1 TABLET BY MOUTH DAILY 90 tablet 3   No current facility-administered medications for this encounter.     Vitals:   11/15/17 1344  BP: (!) 168/82  Pulse: 93  SpO2: 93%  Weight: 73.3 kg (161 lb 9.6 oz)   Wt Readings from Last 3 Encounters:  11/15/17 73.3 kg (161 lb 9.6 oz)  08/15/17 73.5 kg (162 lb)  06/25/17 72.6 kg (160 lb)     PHYSICAL EXAM: General: Well appearing. No resp difficulty. HEENT: Normal Anicteric  Neck: Supple. JVP flat. Carotids 2+ bilat; no bruits. No thyromegaly or nodule noted. Cor: PMI nondisplaced. RRR, No M/G/R noted Lungs: CTAB, normal effort. Decreased BS throughout. No wheeze. Abdomen: Soft, non-tender, non-distended, no HSM. No bruits or masses. +BS  Extremities: No cyanosis, clubbing, or rash. R and  LLE trace edema. BLE TED hose on Neuro: Alert & orientedx3, cranial nerves grossly intact. moves all 4 extremities w/o difficulty. Affect pleasant   ASSESSMENT & PLAN: 1. RV failure. - Echo 11/2016 EF 55-60%, grade 1 DD, RV normal - Suspect due to COPD and untreated OSA - No evidence of RV infarct or infiltrative process on cMRI - Volume status stable. - Continue 40 mg lasix daily - Off spiro due to hyperkalemia. Last K 3.5, creatinine 0.93 - Continue oxygen.   - Continue pulmonary rehab.    2. CAD - s/p inferior MI 2011 with RCA stents - cath with non-obstructive CAD in 6/18 (LAD 20%, dLCX 60%, RCA 30%) - No s/s ischemia - Continue ASA/statin  4. Tobacco use and chronic hypoxic respiratory failure - Quit smoking in July 19, 2016.  - Continue O2. Follows with Dr Melvyn Novas - Continue Pulmonary Rehab  5. HTN - Has white coat syndrome - Says her BP runs 120s at home.   Georgiana Shore, NP  2:09 PM   Patient seen and examined with the above-signed Advanced Practice Provider and/or Housestaff. I personally reviewed laboratory data, imaging studies and relevant notes. I independently examined the patient and formulated the important aspects of the plan. I have edited the note to reflect any of my changes or salient points. I have personally discussed the plan with the patient and/or family.  Doing great. No CP or anginal symptoms. Doing great with Pulmonary Rehab. No evidence of RV failure on exam. Will repeat echo at next visit.   Glori Bickers, MD  2:29 PM

## 2017-11-15 NOTE — Addendum Note (Signed)
Encounter addended by: Cleon Gustin, RN on: 11/15/2017 2:36 PM  Actions taken: Sign clinical note, Diagnosis association updated, Order list changed

## 2017-11-20 ENCOUNTER — Encounter (HOSPITAL_COMMUNITY)
Admission: RE | Admit: 2017-11-20 | Discharge: 2017-11-20 | Disposition: A | Discharge status: Home / Self Care | Payer: Self-pay | Source: Ambulatory Visit | Attending: Internal Medicine | Admitting: Internal Medicine

## 2017-11-22 ENCOUNTER — Encounter (HOSPITAL_COMMUNITY)
Admission: RE | Admit: 2017-11-22 | Discharge: 2017-11-22 | Disposition: A | Discharge status: Home / Self Care | Payer: Self-pay | Source: Ambulatory Visit | Attending: Internal Medicine | Admitting: Internal Medicine

## 2017-11-27 ENCOUNTER — Telehealth (HOSPITAL_COMMUNITY): Payer: Self-pay

## 2017-11-27 NOTE — Telephone Encounter (Signed)
Patient called out due to back hurting.

## 2017-11-29 ENCOUNTER — Encounter (HOSPITAL_COMMUNITY)
Admission: RE | Admit: 2017-11-29 | Discharge: 2017-11-29 | Disposition: A | Discharge status: Home / Self Care | Payer: Self-pay | Source: Ambulatory Visit | Attending: Internal Medicine | Admitting: Internal Medicine

## 2017-12-04 ENCOUNTER — Encounter (HOSPITAL_COMMUNITY): Payer: Self-pay

## 2017-12-06 ENCOUNTER — Encounter (HOSPITAL_COMMUNITY)
Admission: RE | Admit: 2017-12-06 | Discharge: 2017-12-06 | Disposition: A | Discharge status: Home / Self Care | Payer: Self-pay | Source: Ambulatory Visit | Attending: Internal Medicine | Admitting: Internal Medicine

## 2017-12-07 ENCOUNTER — Other Ambulatory Visit: Payer: Self-pay | Admitting: Family Medicine

## 2017-12-07 DIAGNOSIS — Z1231 Encounter for screening mammogram for malignant neoplasm of breast: Secondary | ICD-10-CM

## 2017-12-11 ENCOUNTER — Encounter (HOSPITAL_COMMUNITY)
Admission: RE | Admit: 2017-12-11 | Discharge: 2017-12-11 | Disposition: A | Discharge status: Home / Self Care | Payer: Self-pay | Source: Ambulatory Visit | Attending: Internal Medicine | Admitting: Internal Medicine

## 2017-12-13 ENCOUNTER — Encounter (HOSPITAL_COMMUNITY)
Admission: RE | Admit: 2017-12-13 | Discharge: 2017-12-13 | Disposition: A | Discharge status: Home / Self Care | Payer: Self-pay | Source: Ambulatory Visit | Attending: Internal Medicine | Admitting: Internal Medicine

## 2017-12-17 ENCOUNTER — Other Ambulatory Visit (HOSPITAL_COMMUNITY): Payer: Self-pay | Admitting: Internal Medicine

## 2017-12-18 ENCOUNTER — Encounter (HOSPITAL_COMMUNITY)
Admission: RE | Admit: 2017-12-18 | Discharge: 2017-12-18 | Disposition: A | Discharge status: Home / Self Care | Payer: Self-pay | Source: Ambulatory Visit | Attending: Internal Medicine | Admitting: Internal Medicine

## 2017-12-18 DIAGNOSIS — I50810 Right heart failure, unspecified: Secondary | ICD-10-CM | POA: Insufficient documentation

## 2017-12-20 ENCOUNTER — Encounter (HOSPITAL_COMMUNITY)
Admission: RE | Admit: 2017-12-20 | Discharge: 2017-12-20 | Disposition: A | Discharge status: Home / Self Care | Payer: Self-pay | Source: Ambulatory Visit | Attending: Internal Medicine | Admitting: Internal Medicine

## 2017-12-25 ENCOUNTER — Encounter (HOSPITAL_COMMUNITY): Payer: Medicare Other

## 2017-12-27 ENCOUNTER — Encounter (HOSPITAL_COMMUNITY)
Admission: RE | Admit: 2017-12-27 | Discharge: 2017-12-27 | Disposition: A | Discharge status: Home / Self Care | Payer: Self-pay | Source: Ambulatory Visit | Attending: Internal Medicine | Admitting: Internal Medicine

## 2018-01-01 ENCOUNTER — Encounter (HOSPITAL_COMMUNITY): Payer: Self-pay

## 2018-01-03 ENCOUNTER — Encounter (HOSPITAL_COMMUNITY): Payer: Self-pay

## 2018-01-08 ENCOUNTER — Encounter (HOSPITAL_COMMUNITY)
Admission: RE | Admit: 2018-01-08 | Discharge: 2018-01-08 | Disposition: A | Discharge status: Home / Self Care | Payer: Self-pay | Source: Ambulatory Visit | Attending: Internal Medicine | Admitting: Internal Medicine

## 2018-01-15 ENCOUNTER — Encounter (HOSPITAL_COMMUNITY): Payer: Self-pay

## 2018-01-15 DIAGNOSIS — I50810 Right heart failure, unspecified: Secondary | ICD-10-CM | POA: Insufficient documentation

## 2018-01-17 ENCOUNTER — Ambulatory Visit
Admission: RE | Admit: 2018-01-17 | Discharge: 2018-01-17 | Disposition: A | Discharge status: Home / Self Care | Payer: Medicare Other | Source: Ambulatory Visit | Attending: Family Medicine | Admitting: Family Medicine

## 2018-01-17 ENCOUNTER — Encounter (HOSPITAL_COMMUNITY)
Admission: RE | Admit: 2018-01-17 | Discharge: 2018-01-17 | Disposition: A | Discharge status: Home / Self Care | Payer: Self-pay | Source: Ambulatory Visit | Attending: Internal Medicine | Admitting: Internal Medicine

## 2018-01-17 DIAGNOSIS — Z1231 Encounter for screening mammogram for malignant neoplasm of breast: Secondary | ICD-10-CM

## 2018-01-21 ENCOUNTER — Other Ambulatory Visit: Payer: Self-pay | Admitting: Family Medicine

## 2018-01-21 DIAGNOSIS — R928 Other abnormal and inconclusive findings on diagnostic imaging of breast: Secondary | ICD-10-CM

## 2018-01-22 ENCOUNTER — Encounter (HOSPITAL_COMMUNITY): Payer: Self-pay

## 2018-01-24 ENCOUNTER — Encounter (HOSPITAL_COMMUNITY): Payer: Self-pay

## 2018-01-29 ENCOUNTER — Other Ambulatory Visit: Payer: Medicare Other

## 2018-01-29 ENCOUNTER — Telehealth (HOSPITAL_COMMUNITY): Payer: Self-pay | Admitting: Family Medicine

## 2018-01-29 ENCOUNTER — Encounter (HOSPITAL_COMMUNITY): Payer: Self-pay

## 2018-01-31 ENCOUNTER — Encounter (HOSPITAL_COMMUNITY)
Admission: RE | Admit: 2018-01-31 | Discharge: 2018-01-31 | Disposition: A | Discharge status: Home / Self Care | Payer: Self-pay | Source: Ambulatory Visit | Attending: Internal Medicine | Admitting: Internal Medicine

## 2018-02-05 ENCOUNTER — Encounter (HOSPITAL_COMMUNITY): Payer: Self-pay

## 2018-02-07 ENCOUNTER — Encounter (HOSPITAL_COMMUNITY): Payer: Self-pay

## 2018-02-12 ENCOUNTER — Encounter (HOSPITAL_COMMUNITY)
Admission: RE | Admit: 2018-02-12 | Discharge: 2018-02-12 | Disposition: A | Discharge status: Home / Self Care | Payer: Self-pay | Source: Ambulatory Visit | Attending: Internal Medicine | Admitting: Internal Medicine

## 2018-02-14 ENCOUNTER — Encounter (HOSPITAL_COMMUNITY): Payer: Self-pay

## 2018-02-14 DIAGNOSIS — I50810 Right heart failure, unspecified: Secondary | ICD-10-CM | POA: Insufficient documentation

## 2018-02-18 ENCOUNTER — Other Ambulatory Visit: Payer: Self-pay | Admitting: Family Medicine

## 2018-02-18 ENCOUNTER — Ambulatory Visit
Admission: RE | Admit: 2018-02-18 | Discharge: 2018-02-18 | Disposition: A | Discharge status: Home / Self Care | Payer: Medicare Other | Source: Ambulatory Visit | Attending: Family Medicine | Admitting: Family Medicine

## 2018-02-18 DIAGNOSIS — R928 Other abnormal and inconclusive findings on diagnostic imaging of breast: Secondary | ICD-10-CM

## 2018-02-18 DIAGNOSIS — N63 Unspecified lump in unspecified breast: Secondary | ICD-10-CM

## 2018-02-19 ENCOUNTER — Encounter (HOSPITAL_COMMUNITY)
Admission: RE | Admit: 2018-02-19 | Discharge: 2018-02-19 | Disposition: A | Discharge status: Home / Self Care | Payer: Self-pay | Source: Ambulatory Visit | Attending: Internal Medicine | Admitting: Internal Medicine

## 2018-02-21 ENCOUNTER — Encounter (HOSPITAL_COMMUNITY)
Admission: RE | Admit: 2018-02-21 | Discharge: 2018-02-21 | Disposition: A | Discharge status: Home / Self Care | Payer: Self-pay | Source: Ambulatory Visit | Attending: Internal Medicine | Admitting: Internal Medicine

## 2018-02-26 ENCOUNTER — Encounter (HOSPITAL_COMMUNITY): Payer: Self-pay

## 2018-02-28 ENCOUNTER — Telehealth (HOSPITAL_COMMUNITY): Payer: Self-pay | Admitting: Family Medicine

## 2018-02-28 ENCOUNTER — Encounter (HOSPITAL_COMMUNITY): Payer: Self-pay

## 2018-03-05 ENCOUNTER — Encounter (HOSPITAL_COMMUNITY)
Admission: RE | Admit: 2018-03-05 | Discharge: 2018-03-05 | Disposition: A | Discharge status: Home / Self Care | Payer: Self-pay | Source: Ambulatory Visit | Attending: Internal Medicine | Admitting: Internal Medicine

## 2018-03-07 ENCOUNTER — Encounter (HOSPITAL_COMMUNITY)
Admission: RE | Admit: 2018-03-07 | Discharge: 2018-03-07 | Disposition: A | Discharge status: Home / Self Care | Payer: Self-pay | Source: Ambulatory Visit | Attending: Internal Medicine | Admitting: Internal Medicine

## 2018-03-12 ENCOUNTER — Encounter (HOSPITAL_COMMUNITY)
Admission: RE | Admit: 2018-03-12 | Discharge: 2018-03-12 | Disposition: A | Discharge status: Home / Self Care | Payer: Self-pay | Source: Ambulatory Visit | Attending: Internal Medicine | Admitting: Internal Medicine

## 2018-03-14 ENCOUNTER — Encounter (HOSPITAL_COMMUNITY)
Admission: RE | Admit: 2018-03-14 | Discharge: 2018-03-14 | Disposition: A | Discharge status: Home / Self Care | Payer: Self-pay | Source: Ambulatory Visit | Attending: Internal Medicine | Admitting: Internal Medicine

## 2018-03-19 ENCOUNTER — Encounter (HOSPITAL_COMMUNITY)
Admission: RE | Admit: 2018-03-19 | Discharge: 2018-03-19 | Disposition: A | Discharge status: Home / Self Care | Payer: Self-pay | Source: Ambulatory Visit | Attending: Internal Medicine | Admitting: Internal Medicine

## 2018-03-19 DIAGNOSIS — I50810 Right heart failure, unspecified: Secondary | ICD-10-CM | POA: Insufficient documentation

## 2018-03-21 ENCOUNTER — Encounter (HOSPITAL_COMMUNITY): Payer: Self-pay

## 2018-03-21 DIAGNOSIS — M79641 Pain in right hand: Secondary | ICD-10-CM | POA: Insufficient documentation

## 2018-03-26 ENCOUNTER — Encounter (HOSPITAL_COMMUNITY)
Admission: RE | Admit: 2018-03-26 | Discharge: 2018-03-26 | Disposition: A | Discharge status: Home / Self Care | Payer: Self-pay | Source: Ambulatory Visit | Attending: Internal Medicine | Admitting: Internal Medicine

## 2018-03-28 ENCOUNTER — Encounter (HOSPITAL_COMMUNITY)
Admission: RE | Admit: 2018-03-28 | Discharge: 2018-03-28 | Disposition: A | Discharge status: Home / Self Care | Payer: Self-pay | Source: Ambulatory Visit | Attending: Internal Medicine | Admitting: Internal Medicine

## 2018-04-02 ENCOUNTER — Encounter (HOSPITAL_COMMUNITY): Payer: Self-pay

## 2018-04-04 ENCOUNTER — Encounter (HOSPITAL_COMMUNITY): Payer: Self-pay

## 2018-04-09 ENCOUNTER — Encounter (HOSPITAL_COMMUNITY)
Admission: RE | Admit: 2018-04-09 | Discharge: 2018-04-09 | Disposition: A | Discharge status: Home / Self Care | Payer: Self-pay | Source: Ambulatory Visit | Attending: Internal Medicine | Admitting: Internal Medicine

## 2018-04-11 ENCOUNTER — Encounter (HOSPITAL_COMMUNITY)
Admission: RE | Admit: 2018-04-11 | Discharge: 2018-04-11 | Disposition: A | Discharge status: Home / Self Care | Payer: Self-pay | Source: Ambulatory Visit | Attending: Internal Medicine | Admitting: Internal Medicine

## 2018-04-16 ENCOUNTER — Encounter (HOSPITAL_COMMUNITY): Payer: Self-pay

## 2018-04-16 ENCOUNTER — Telehealth (HOSPITAL_COMMUNITY): Payer: Self-pay | Admitting: Family Medicine

## 2018-04-16 DIAGNOSIS — I50810 Right heart failure, unspecified: Secondary | ICD-10-CM | POA: Insufficient documentation

## 2018-04-18 ENCOUNTER — Encounter (HOSPITAL_COMMUNITY)
Admission: RE | Admit: 2018-04-18 | Discharge: 2018-04-18 | Disposition: A | Discharge status: Home / Self Care | Payer: Self-pay | Source: Ambulatory Visit | Attending: Internal Medicine | Admitting: Internal Medicine

## 2018-04-19 ENCOUNTER — Telehealth (HOSPITAL_COMMUNITY): Payer: Self-pay

## 2018-04-19 NOTE — Telephone Encounter (Signed)
Patient called to receive cardiac clarence.

## 2018-04-23 ENCOUNTER — Telehealth (HOSPITAL_COMMUNITY): Payer: Self-pay | Admitting: Family Medicine

## 2018-04-23 ENCOUNTER — Encounter (HOSPITAL_COMMUNITY): Payer: Self-pay

## 2018-04-25 ENCOUNTER — Encounter (HOSPITAL_COMMUNITY)
Admission: RE | Admit: 2018-04-25 | Discharge: 2018-04-25 | Disposition: A | Discharge status: Home / Self Care | Payer: Self-pay | Source: Ambulatory Visit | Attending: Internal Medicine | Admitting: Internal Medicine

## 2018-04-25 ENCOUNTER — Other Ambulatory Visit: Payer: Self-pay

## 2018-04-29 ENCOUNTER — Telehealth (HOSPITAL_COMMUNITY): Payer: Self-pay | Admitting: *Deleted

## 2018-04-30 ENCOUNTER — Encounter (HOSPITAL_COMMUNITY): Payer: Self-pay

## 2018-04-30 ENCOUNTER — Telehealth (HOSPITAL_COMMUNITY): Payer: Self-pay | Admitting: *Deleted

## 2018-04-30 NOTE — Telephone Encounter (Signed)
Received fax from [facility], pt needs clearance for left carpal tunnel release under local anesthesia with IV sedation.  Per Dr Haroldine Laws: " low risk"  Note faxed back to them at [phone]

## 2018-05-02 ENCOUNTER — Encounter (HOSPITAL_COMMUNITY): Payer: Self-pay

## 2018-05-07 ENCOUNTER — Telehealth (HOSPITAL_COMMUNITY): Payer: Self-pay

## 2018-05-07 ENCOUNTER — Encounter (HOSPITAL_COMMUNITY): Payer: Self-pay

## 2018-05-09 ENCOUNTER — Encounter (HOSPITAL_COMMUNITY): Payer: Self-pay

## 2018-05-10 NOTE — Telephone Encounter (Signed)
She can try but we've never done this for just the 02 equipment because the government assumes you have a contingency (place to go while power is off) and can plug into your car in an emergency.  Certainly you can try and they can say no and re-calculate the tax without the deduction and send you a bill .  Since taxes aren't due until July 15 if she submit it now they may not penalize her but shed'd have to take this up with the tax person she's using.   Her letter should say :  Advised to install home generator as needs to be on 02 24h per day for chronic respiratory failure and has only a few hours of backup 02 in event of power loss.

## 2018-05-10 NOTE — Telephone Encounter (Signed)
Patient is requesting a Rx for a generator. She has already had it installed in her house. She just needs an Rx to be able to give to her tax lady to deduct it on her taxes, as it is a necessity because she is on continuous flow O2 at night. Patient is meeting with her tax lady on 05/13/18 (Monday)  Dr. Melvyn Novas, please advise.

## 2018-05-14 ENCOUNTER — Encounter (HOSPITAL_COMMUNITY): Payer: Self-pay

## 2018-05-16 ENCOUNTER — Encounter (HOSPITAL_COMMUNITY): Payer: Self-pay

## 2018-05-21 ENCOUNTER — Encounter (HOSPITAL_COMMUNITY): Payer: Self-pay

## 2018-05-23 ENCOUNTER — Encounter (HOSPITAL_COMMUNITY): Payer: Self-pay

## 2018-05-28 ENCOUNTER — Encounter (HOSPITAL_COMMUNITY): Payer: Self-pay | Admitting: *Deleted

## 2018-05-28 ENCOUNTER — Encounter (HOSPITAL_COMMUNITY): Payer: Self-pay

## 2018-05-28 NOTE — Progress Notes (Signed)
I was able to reach Deanna Johnston today to inform her that the department is closed indefinitely due to the coronavirus. I told her that we would let her know when we will reopen. I discussed food security, medications and exercise with her. She denies any issues with obtaining food and medication. She states that the only exercise she is doing is with getting  Her groceries and house work. I have encouraged her to exercise as she is able to.

## 2018-05-30 ENCOUNTER — Encounter (HOSPITAL_COMMUNITY): Payer: Self-pay

## 2018-06-04 ENCOUNTER — Encounter (HOSPITAL_COMMUNITY): Payer: Self-pay

## 2018-06-06 ENCOUNTER — Encounter (HOSPITAL_COMMUNITY): Payer: Self-pay

## 2018-06-11 ENCOUNTER — Encounter (HOSPITAL_COMMUNITY): Payer: Self-pay

## 2018-06-13 ENCOUNTER — Encounter (HOSPITAL_COMMUNITY): Payer: Self-pay

## 2018-06-17 ENCOUNTER — Other Ambulatory Visit (HOSPITAL_COMMUNITY): Payer: Self-pay | Admitting: Internal Medicine

## 2018-06-18 ENCOUNTER — Encounter (HOSPITAL_COMMUNITY): Payer: Self-pay

## 2018-06-19 ENCOUNTER — Encounter (HOSPITAL_COMMUNITY): Payer: Medicare Other | Admitting: Internal Medicine

## 2018-06-19 ENCOUNTER — Other Ambulatory Visit (HOSPITAL_COMMUNITY): Payer: Medicare Other

## 2018-06-20 ENCOUNTER — Encounter (HOSPITAL_COMMUNITY): Payer: Self-pay

## 2018-06-25 ENCOUNTER — Encounter (HOSPITAL_COMMUNITY): Payer: Self-pay

## 2018-06-26 ENCOUNTER — Ambulatory Visit: Payer: Medicare Other | Admitting: Internal Medicine

## 2018-06-27 ENCOUNTER — Encounter (HOSPITAL_COMMUNITY): Payer: Self-pay

## 2018-07-09 ENCOUNTER — Ambulatory Visit: Payer: Medicare Other | Admitting: Internal Medicine

## 2018-07-09 ENCOUNTER — Encounter: Payer: Self-pay | Admitting: Internal Medicine

## 2018-07-09 ENCOUNTER — Other Ambulatory Visit: Payer: Self-pay

## 2018-07-09 VITALS — BP 154/64 | HR 87 | Temp 98.0°F | Ht 59.0 in | Wt 158.2 lb

## 2018-07-09 DIAGNOSIS — J449 Chronic obstructive pulmonary disease, unspecified: Secondary | ICD-10-CM | POA: Diagnosis not present

## 2018-07-09 DIAGNOSIS — J9611 Chronic respiratory failure with hypoxia: Secondary | ICD-10-CM | POA: Diagnosis not present

## 2018-07-09 NOTE — Progress Notes (Signed)
Subjective:   Patient ID: Deanna Johnston, female    DOB: [birth year]   MRN: [id number]    Brief patient profile:  44  yowf quit smoking 07/2016  with tendency to rhinitis/cough x adulthood better since around 2000 on one tsp honey per day then cough started early Dec 2014 and persisted so referred 03/11/2013 by Lady Deutscher proved to have probable acei cough and unrelated  COPD GOLD II documented 04/14/2013     History of Present Illness    03/11/2013 1st [facility] office visit/ Deanna Johnston on ACEi cc persistent cough x 8 weeks she attributes to getting the pneumonia booster more day > night mostly  non-productive  And assoc with sense of pnds / throat tickle.  Does not wake her from sleep and not aware of it until after stirs in am. Already tried abx and inhalers not helping rec The key is to stop smoking completely before smoking completely stops you - this is the most important aspect of your care Stop lisinopril one daily  Start diovan 80 mg one daily      04/14/2013 f/u ov/Deanna Johnston re: GOLD II COPD/ still smoking Cc cough now mostly at hs now whereas was more all day before/ somewhat rattling features, but no excess or purulent sputum production. Not limited by breathing from desired activities  - no need for saba   rec You have GOLD II COPD, the only way to keep it from progressing is to stop smoking now. If the cough starts bothering you again: Pantoprazole (protonix) 40 mg   Take 30-60 min before first meal of the day and Pepcid 20 mg one bedtime until return to office - this is the best way to tell whether stomach acid is contributing to your problem.   GERD diet Please schedule a follow up office visit in 4 weeks, sooner if needed > did not return as rec    Admit date: 07/20/2016 Discharge date: 07/29/2016  Discharge Diagnoses:  Principal Problem:   Acute respiratory failure with hypoxia and hypercapnia (HCC) Active Problems:   Acute on chronic heart failure (HCC)   Cardiogenic shock  (HCC)   Tobacco dependence   Hyperlipidemia   Essential hypertension, benign   Pressure injury of skin   Hypotension   Right ventricular failure (HCC)   PAH (pulmonary artery hypertension) ([place])   Hypoxia   History of present illness:  Per Dr. Corrie Dandy 73 year old female with PMH of COPD (Active smoker, 40-50 year 2 ppd, reports cut back to 1/2 ppd), CAD s/p stents in 2011, HLD, MI. Reports at baseline is SOB with chronic cough.   Presents to ED on 6/7 with progressive dyspnea for the last 2 weeks with increased swelling to BLE. Upon arrival to ED BNP 1113.9, WBC 11.6, Lactic 1.45, ABG 7.3/60/63. Hypotensive with systolic 53-97, oxygenation 90s on 4L , received 500 ml bolus and Rocephin/Azithromycin. Cardiology was consulted who recommended diuresis. PCCM asked to admit.   Hospital Course:  #1 acute on chronic hypoxic/hypercarbic respiratory failure Concern for possible acute exacerbation of COPD and acute on chronic right ventricular heart failure. Patient was on the critical care service initially required BiPAP and subsequently weaned down to high flow nasal 6 L. Patient with clinical improvement. It was felt by critical care medicine the patient was likely hypoxemic for years and will likely need home O2 upon discharge. BiPAP was subsequently discontinued. Patient will was also diuresed with IV diuretics and transitioned to oral Lasix per cardiology.  Patient was -10.8 L during this hospitalization. Patient status post antibiotics. Patient also placed on DuoNeb's per pulmonary recommendations and will be discharged on DuoNeb's. Patient improved clinically. Oxygen on room air and ambulation with checked and patient was noted to have sats of 85% on room air. Patient be discharged home on oxygen 3 L nasal cannula. Patient is to follow-up with pulmonary and cardiology in the outpatient setting.  #2 shock secondary to right ventricular failure likely secondary to cor pulmonale  andobstructive sleep apnea 2-D echo 07/21/2016 with a EF of 65-70% with grade 1 diastolic dysfunction, moderate to severely reduced right ventricular systolic function. Dopamine which was initially started due to shock was subsequently discontinued as patient's blood pressure improved. Patient status post right and left heart catheterization 07/26/2016 with EF of 65%, 60% distal circumflex lesion, 20% proximal LAD to mid LAD lesion, 30% distal RCA lesion, 20% stenosed mid RCA lesion. Patient subsequently underwent cardiac MRI. Is felt per pulmonary and cardiology that patient would likely need home O2 and outpatient sleep study. Patient's blood pressure stabilized after dopamine was discontinued. Patient's antihypertensive medications were held due to borderline blood pressure was not resumed on discharge. Patient was discharged on oral Lasix 40 mg daily and is to follow-up with cardiology and pulmonary outpatient setting. Patient will be set up by pulmonary for sleep study.   #3 hyperlipidemia Patient was placed on a statin during the hospitalization be discharged on a statin.  #4 probable obstructive sleep apnea Patient will need outpatient sleep study. Pulmonary to set up as an outpatient.  #6 acute kidney injury Resolved.  #7 coronary artery disease Stable. Cardiac medications of metoprolol and ARB were held due to borderline blood pressure and not resumed on discharge. Patient will follow-up with cardiology in the outpatient setting.     NP ov  08/17/16  Begin Anoro 1 puff daily . Rinse after use> stopped after one week   [place] job not smoking . Keep up good work . Marland Kitchen  Decrease Oxygen 2l/m .  Order for POC oxygen device.    09/25/2016  Extended f/u ov/Deanna Johnston re: re-establish for copd management after failing to f/u in 04/2013 as above  Chief Complaint  Patient presents with  . Follow-up    review pft.  pt states she is doing well, denies any current breathing complaints.     thoroughly confused between maint and prn, not clear did better on anoro as never changed saba to prn  Doe = MMRC2 = can't walk a nl pace on a flat grade s sob but does fine slow and flat eg [employer] shopping on 2lpm does not titrate (has meter, doesn't use with activity) rec When you walk keep saturations above 90%  - same goal when sitting. Plan A = Automatic =  Anoro one click each am  - 2 good drags  Plan B = Backup Only use your albuterol as a rescue medication  Please see patient coordinator before you leave today  to schedule ambulatory 02 concentrator eligbility        12/26/2016  f/u ov/Deanna Johnston re:  Copd GOLD   II / maint on Anoro/ no need for neb / using 02 2lpm hs and prn daytime Chief Complaint  Patient presents with  . Follow-up    Breathing is doing well. She does not have a rescue inhaler and requests that we prescribe one.   doe x MMRC2 = can't walk a nl pace on a flat grade s sob but does  fine slow and flat eg shopping on 02 No need for any saba at all and want to get rid of neb rec Goal is to keeps sats over 90% at all times so keep on the 2lpm at bedtime and adjust during the day to keep over 90% depending on your level of activity. Plan A = Automatic = Anoro one click each am or nebulizer up to 4 x daily to use it up  Plan B = Backup Only use your albuterol (Proair) as a rescue medication    06/25/2017  f/u ov/Deanna Johnston re:   GOLD II/ copd 02 hs and prn daytime  Chief Complaint  Patient presents with  . Follow-up    Breathing is overall doing well. She rarely has to use her albuterol inhaler.    Dyspnea:  MMRC2 = can't walk a nl pace on a flat grade s sob but does fine slow and flat on 2lpm NP pushing grocery cart anywhere she wants to go  Cough: no Sleep: ok 2lpm horizontal SABA use: none At rest usually sats 95% RA  rec Goal is to keep the 02 saturation above 90%  Work on inhaler technique:   Plan B = Backup Only use your albuterol (Proair)   07/09/2018  f/u  ov/Deanna Johnston re: copd gold II on prn 02 daytime and maint on anoro daily  Chief Complaint  Patient presents with  . Follow-up    Breathing is unchanged. She has only been using o2 with sleep. She has not had to use her albuterol inhaler.    Dyspnea:  MMRC2 = can't walk a nl pace on a flat grade s sob but does fine slow and flat  - pushing s 02 or 02 monitor Cough: none Sleeping: on side one pillow flat bed  SABA use: rare 02: 2lpm hs and prn daytime (never but never checks)  Exac: none since last ov    No obvious day to day or daytime variability or assoc excess/ purulent sputum or mucus plugs or hemoptysis or cp or chest tightness, subjective wheeze or overt sinus or hb symptoms.   Sleeping as above  without nocturnal  or early am exacerbation  of respiratory  c/o's or need for noct saba. Also denies any obvious fluctuation of symptoms with weather or environmental changes or other aggravating or alleviating factors except as outlined above   No unusual exposure hx or h/o childhood pna/ asthma or knowledge of premature birth.  Current Allergies, Complete Past Medical History, Past Surgical History, Family History, and Social History were reviewed in [employer] record.  ROS  The following are not active complaints unless bolded Hoarseness, sore throat, dysphagia, dental problems, itching, sneezing,  nasal congestion or discharge of excess mucus or purulent secretions, ear ache,   fever, chills, sweats, unintended wt loss or wt gain, classically pleuritic or exertional cp,  orthopnea pnd or arm/hand swelling  or leg swelling, presyncope, palpitations, abdominal pain, anorexia, nausea, vomiting, diarrhea  or change in bowel habits or change in bladder habits, change in stools or change in urine, dysuria, hematuria,  rash, arthralgias, visual complaints, headache, numbness, weakness or ataxia or problems with walking or coordination,  change in mood or  memory.        Current  Meds  Medication Sig  . albuterol (PROAIR HFA) 108 (90 Base) MCG/ACT inhaler 2 puffs every 4 hours as needed only  if your can't catch your breath  . ANORO ELLIPTA 62.5-25 MCG/INH AEPB INHALE 1  PUFF BY MOUTH ONCE DAILY  . aspirin EC 81 MG tablet Take 81 mg by mouth daily.  . furosemide (LASIX) 40 MG tablet Take 1 tablet by mouth once daily  . nitroGLYCERIN (NITROSTAT) 0.4 MG SL tablet PLACE 1 TABLET UNDER THE TONGUE EVERY 5 MINUTES AS NEEDED FOR CHEST PAIN  . nystatin cream (MYCOSTATIN) Apply 1 application topically 2 (two) times daily.  . OXYGEN 2lpm with sleep  AHC  . potassium chloride SA (K-DUR,KLOR-CON) 20 MEQ tablet TAKE 1 TABLET BY MOUTH DAILY               Objective:   Physical Exam  amb wf nad   04/14/2013     153 > 09/25/2016   155 > 12/26/2016     160 >  06/25/2017  160 > 07/09/2018   158     03/11/13 153 lb 12.8 oz (69.763 kg)  09/04/12 159 lb (72.122 kg)  08/02/12 158 lb (71.668 kg)     Vital signs reviewed - Note on arrival 02 sats  95% on RA  - note bp repeated much better than on arrival     HEENT: nl dentition / oropharynx. Nl external ear canals without cough reflex -  Mild bilateral non-specific turbinate edema     NECK :  without JVD/Nodes/TM/ nl carotid upstrokes bilaterally   LUNGS: no acc muscle use,  Mild barrel  contour chest wall with bilateral  Distant bs s audible wheeze and  without cough on insp or exp maneuver and mild  Hyperresonant  to  percussion bilaterally     CV:  RRR  no s3 or murmur or increase in P2, and no edema   ABD:  soft and nontender with pos late  insp Hoover's  in the supine position. No bruits or organomegaly appreciated, bowel sounds nl  MS:   Nl gait/  ext warm without deformities, calf tenderness, cyanosis or clubbing No obvious joint restrictions   SKIN: warm and dry without lesions    NEURO:  alert, approp, nl sensorium with  no motor or cerebellar deficits apparent.                Assessment & Plan:

## 2018-07-09 NOTE — Assessment & Plan Note (Signed)
HC03  09/21/16  =  29 (improved from peak of 36 one m prior)  -  09/25/2016   Room  Air at Rest = 88% Patient Saturations on [place] while Ambulating = 87% Patient Saturations on 2L Liters of oxygen while Ambulating = 93%  06/25/2017 rec 2lpm sleeping, titrate with walking, ok to leave off at rest and walking around inside at home  only if sats > 90%   Improved to point where not feeling she needs to wear 02 x at 2lpm hs   Advised however since she has cor pulmonale goal is to keep sats > 90% at all times    I had an extended discussion with the patient reviewing all relevant studies completed to date and  lasting 15 to 20 minutes of a 25 minute visit    See device teaching which extended face to face time for this visit.  Each maintenance medication was reviewed in detail including emphasizing most importantly the difference between maintenance and prns and under what circumstances the prns are to be triggered using an action plan format that is not reflected in the computer generated alphabetically organized AVS which I have not found useful in most complex patients, especially with respiratory illnesses  Please see AVS for specific instructions unique to this visit that I personally wrote and verbalized to the the pt in detail and then reviewed with pt  by my nurse highlighting any  changes in therapy recommended at today's visit to their plan of care.

## 2018-07-09 NOTE — Patient Instructions (Addendum)
No change in your medications   Work on proair inhaler technique:  relax and gently blow all the way out then take a nice smooth deep breath back in, triggering the inhaler at same time you start breathing in.  Hold for up to 5 seconds if you can.  . Rinse and gargle with water when done       Continue anoro one click each am   Only use your albuterol (proair)  as a rescue medication to be used if you can't catch your breath by resting or doing a relaxed purse lip breathing pattern.  - The less you use it, the better it will work when you need it. - Ok to use up to 2 puffs  every 4 hours if you must but call for immediate appointment if use goes up over your usual need - Don't leave home without it !!  (think of it like the spare tire for your car)     If you are satisfied with your treatment plan,  let your doctor know and he/she can either refill your medications or you can return here when your prescription runs out.     If in any way you are not 100% satisfied,  please tell us.  If 100% better, tell your friends!  Pulmonary follow up is as needed

## 2018-07-09 NOTE — Assessment & Plan Note (Addendum)
Quit smoking 07/2016  - PFTs 04/14/2013  FEV1  1.03 (59%) ratio 69 and no better with dlco 66% - PFT's  09/25/2016  FEV1 1.16  (71 % ) ratio 64  p no % improvement from saba p neb w/in 4 h  prior to study with DLCO  58/52  % corrects to 62 % for alv volume   - 09/25/2016  After extensive coaching  Device  effectiveness =    90% vs 0 % baseline (blew out first) on elipta device so rechallenge with anoro each am   - 07/09/2018  After extensive coaching inhaler device,  effectiveness =    75% from a baseline of 50% with hfa/ 90% with dpi    Pt is Group B in terms of symptom/risk and laba/lama therefore appropriate rx at this point >>>  Continue anoro and prn saba.  Pulmonary f/u can be prn  And ov yearly for refills or Dr Laurann Montana can fill rx

## 2018-08-15 ENCOUNTER — Other Ambulatory Visit: Payer: Self-pay

## 2018-08-19 ENCOUNTER — Ambulatory Visit (INDEPENDENT_AMBULATORY_CARE_PROVIDER_SITE_OTHER): Payer: Medicare Other | Admitting: Gynecology

## 2018-08-19 ENCOUNTER — Telehealth (HOSPITAL_COMMUNITY): Payer: Self-pay | Admitting: *Deleted

## 2018-08-19 ENCOUNTER — Encounter: Payer: Self-pay | Admitting: Gynecology

## 2018-08-19 ENCOUNTER — Other Ambulatory Visit: Payer: Self-pay

## 2018-08-19 VITALS — BP 160/74 | Ht 59.0 in | Wt 159.0 lb

## 2018-08-19 DIAGNOSIS — M810 Age-related osteoporosis without current pathological fracture: Secondary | ICD-10-CM | POA: Diagnosis not present

## 2018-08-19 DIAGNOSIS — N952 Postmenopausal atrophic vaginitis: Secondary | ICD-10-CM | POA: Diagnosis not present

## 2018-08-19 DIAGNOSIS — Z01419 Encounter for gynecological examination (general) (routine) without abnormal findings: Secondary | ICD-10-CM

## 2018-08-19 NOTE — Progress Notes (Signed)
    Deanna Johnston 1945/09/05 [id number]        73 y.o.  G0P0 for annual gynecologic exam.  Without gynecologic complaints  Past medical history,surgical history, problem list, medications, allergies, family history and social history were all reviewed and documented as reviewed in the EPIC chart.  ROS:  Performed with pertinent positives and negatives included in the history, assessment and plan.   Additional significant findings : None   Exam: Caryn Bee assistant Vitals:   08/19/18 1450  BP: (!) 160/74  Weight: 159 lb (72.1 kg)  Height: 4\' 11"  (1.499 m)   Body mass index is 32.11 kg/m.  General appearance:  Normal affect, orientation and appearance. Skin: Grossly normal HEENT: Without gross lesions.  No cervical or supraclavicular adenopathy. Thyroid normal.  Lungs:  Clear without wheezing, rales or rhonchi Cardiac: RR, without RMG Abdominal:  Soft, nontender, without masses, guarding, rebound, organomegaly or hernia Breasts:  Examined lying and sitting without masses, retractions, discharge or axillary adenopathy. Pelvic:  Ext, BUS, Vagina: With atrophic changes  Adnexa: Without masses or tenderness    Anus and perineum: Normal   Rectovaginal: Normal sphincter tone without palpated masses or tenderness.    Assessment/Plan:  73 y.o. G0P0 female for annual gynecologic exam.  Status post TAH/BSO in the past  1. Postmenopausal.  No significant menopausal symptoms. 2. Osteoporosis.  DEXA 2011 T score -3.  We discussed multiple times her increased risk of fracture and options for treatment.  Patient states that she would not take medication and declines any further testing. 3. Colonoscopy unable to do due to tortuosity of her intestines.  Is doing stool sample testing through her primary physician's office. 4. Mammography follow-up coming due and scheduled in August due to complex cyst that was seen on screening mammogram in January.  Patient will follow-up for this.  Breast  exam normal today. 5. Pap smear 2019.  No Pap smear done today.  3 of VAIN in the past treated with Efudex.  Most recent Pap smears have been normal.  Recommend follow-up Pap smear at 3-year interval. 6. Health maintenance.  No routine lab work done as patient does this elsewhere.  Blood pressure 160/74.  Patient follows at home and notes it is normally below 120/70 range.  Is having surgery tomorrow and attributes to the elevation to the stress and GYN exam today.  Will follow-up and have it monitored at home.  Follow-up here in 1 year for annual exam.  Anastasio Auerbach MD, 3:15 PM 08/19/2018

## 2018-08-19 NOTE — Patient Instructions (Signed)
Follow-up in 1 year for annual exam 

## 2018-08-19 NOTE — Telephone Encounter (Signed)
Called to update patient on the status of maintenance Pulmonary Rehab exercise classes. At this point they remain on hold due to the COVID-19 pandemic precautions.

## 2018-08-27 ENCOUNTER — Ambulatory Visit
Admission: RE | Admit: 2018-08-27 | Discharge: 2018-08-27 | Disposition: A | Discharge status: Home / Self Care | Payer: Medicare Other | Source: Ambulatory Visit | Attending: Family Medicine | Admitting: Family Medicine

## 2018-08-27 ENCOUNTER — Other Ambulatory Visit: Payer: Self-pay | Admitting: Family Medicine

## 2018-08-27 ENCOUNTER — Other Ambulatory Visit: Payer: Medicare Other

## 2018-08-27 DIAGNOSIS — N6320 Unspecified lump in the left breast, unspecified quadrant: Secondary | ICD-10-CM

## 2018-08-27 DIAGNOSIS — N63 Unspecified lump in unspecified breast: Secondary | ICD-10-CM

## 2018-08-28 ENCOUNTER — Ambulatory Visit: Payer: Medicare Other | Admitting: Internal Medicine

## 2018-09-11 ENCOUNTER — Encounter (HOSPITAL_COMMUNITY): Payer: Medicare Other | Admitting: Internal Medicine

## 2018-09-11 ENCOUNTER — Other Ambulatory Visit (HOSPITAL_COMMUNITY): Payer: Self-pay | Admitting: Internal Medicine

## 2018-09-11 ENCOUNTER — Other Ambulatory Visit (HOSPITAL_COMMUNITY): Payer: Medicare Other

## 2018-09-23 ENCOUNTER — Telehealth: Payer: Self-pay | Admitting: Cardiology

## 2018-09-23 NOTE — Telephone Encounter (Signed)
Recall - Pt seeing Bensihmon, says will be back once he lets her go

## 2018-09-30 ENCOUNTER — Other Ambulatory Visit: Payer: Self-pay | Admitting: Internal Medicine

## 2018-10-08 ENCOUNTER — Other Ambulatory Visit (HOSPITAL_COMMUNITY): Payer: Self-pay | Admitting: Internal Medicine

## 2018-10-13 NOTE — Progress Notes (Signed)
HF: Dr Haroldine Laws Pulmonary : Dr Melvyn Novas PCP: Dr Kelton Pillar   ADVANCED HF CLINIC NOTE  HPI: Deanna Johnston) is a 73 year old female with PMH of COPD with ongoing tobacco use, CAD s/p NSTEMI with RCA stents in 2011 and obesity.  Admitted to [facility] on 07/20/16 with progressive dyspnea and LE edema. Upon arrival to ED BNP 1113.9, WBC 11.6, Lactic 1.45, ABG 7.3/60/63. Hypotensive with systolic BPs 123XX123, oxygenation 90s on 4L [place], received 500 ml bolus and Rocephin/Azithromycin. Admitted to [facility].   Today she returns for HF follow up. Says she feels great. Denies SOB. No edema, orthopnea or PND. Recently had left carpal tunnel surgery with Dr. Veronia Beets. Uses oxygen at night. Follows O2 sats and they run 93-95%. Has occasional palpitations about every palpitations. Lasts 1 minutes and goes away. BP at home in 140s  Echo today EF 60-65% RV normal. Personally reviewed   Testing  Echo 07/21/16: LV EF 65-70% with dilated RV and moderate RV HK. Started on dopamine for RV support. CT chest no PE. Diuresed well and renal function improved.  11/2016  LV function remains normal. RV now improved with normal size and function.  Underwent RHC/LHC on 07/26/16   Mid RCA to Dist RCA lesion, 0 %stenosed.  Mid RCA lesion, 20 %stenosed.  Dist RCA lesion, 30 %stenosed.  Dist Cx lesion, 60 %stenosed.  Prox LAD to Mid LAD lesion, 20 %stenosed.  The left ventricular ejection fraction is greater than 65% by visual estimate. Findings: Done on dopamine 88mcg/kg/min Ao = 110/47 (72) LV = 120/10 RA = 5 RV = 47/7 PA = 42/16 (27) PCW = 6 Fick cardiac output/index = 5.7/3.4 PVR = 4.2 WU SVR 945 Ao sat = 88% PA sat = 71%, 69% High SVC sat = 69% Assessment: 1. Heavily calcified coronary arteries with only minimal luminal CAD. Widely patent RCA stent 2. LVEF 70% without regional wall motion abnormalities 3. Mild PAH with normal cardiac output on low-dose dopamine 4. No intracardiac shunt 4. PAD -  central aortic pressure ~73mmHG greater than radial artery pressure or cuff pressure  CMRI done to evaluate RV dysfunction - LVEF 77% No RV infarct. Possible infiltrative process but not classic amyloid process. No RV scar  SPEP UPEP negative. (cMRI reviewed with Dr. Meda Coffee and not felt to have infiltrative process)  SH:  Social History   Socioeconomic History  . Marital status: Single    Spouse name: Not on file  . Number of children: Not on file  . Years of education: Not on file  . Highest education level: Not on file  Occupational History  . Occupation: Retired    Fish farm manager: RETIRED  Social Needs  . Financial resource strain: Not on file  . Food insecurity    Worry: Not on file    Inability: Not on file  . Transportation needs    Medical: Not on file    Non-medical: Not on file  Tobacco Use  . Smoking status: Former Smoker    Packs/day: 0.50    Years: 47.00    Pack years: 23.50    Types: Cigarettes    Quit date: 07/20/2016    Years since quitting: 2.2  . Smokeless tobacco: Never Used  Substance and Sexual Activity  . Alcohol use: Yes    Alcohol/week: 0.0 standard drinks    Comment: Rare  . Drug use: No  . Sexual activity: Never    Birth control/protection: Post-menopausal, Surgical    Comment: HYST-1st intercourse  73 yo-More than 5 partners  Lifestyle  . Physical activity    Days per week: Not on file    Minutes per session: Not on file  . Stress: Not on file  Relationships  . Social Herbalist on phone: Not on file    Gets together: Not on file    Attends religious service: Not on file    Active member of club or organization: Not on file    Attends meetings of clubs or organizations: Not on file    Relationship status: Not on file  . Intimate partner violence    Fear of current or ex partner: Not on file    Emotionally abused: Not on file    Physically abused: Not on file    Forced sexual activity: Not on file  Other Topics Concern  . Not on  file  Social History Narrative  . Not on file    FH:  Family History  Problem Relation Age of Onset  . Heart failure Mother   . Stomach cancer Mother   . Heart disease Father   . Diabetes Father   . Colon cancer Sister   . Heart disease Brother     Past Medical History:  Diagnosis Date  . Cataract   . Cervical dysplasia   . COPD (chronic obstructive pulmonary disease) ([place])   . Coronary artery disease   . Hyperlipidemia   . MI (myocardial infarction) [facility]) Dec. 2011   stent mid right coronary  . Obesity   . Tobacco dependence   . VAIN (vaginal intraepithelial neoplasia) 2011   Efudex treatment    Current Outpatient Medications  Medication Sig Dispense Refill  . albuterol (PROAIR HFA) 108 (90 Base) MCG/ACT inhaler 2 puffs every 4 hours as needed only  if your can't catch your breath 1 Inhaler 1  . ANORO ELLIPTA 62.5-25 MCG/INH AEPB INHALE 1 PUFF BY MOUTH ONCE DAILY 60 each 11  . aspirin EC 81 MG tablet Take 81 mg by mouth daily.    . furosemide (LASIX) 40 MG tablet Take 1 tablet by mouth once daily 90 tablet 0  . nitroGLYCERIN (NITROSTAT) 0.4 MG SL tablet PLACE 1 TABLET UNDER THE TONGUE EVERY 5 MINUTES AS NEEDED FOR CHEST PAIN 25 tablet 0  . nystatin cream (MYCOSTATIN) Apply 1 application topically 2 (two) times daily. 30 g 3  . OXYGEN 2lpm with sleep  AHC    . potassium chloride SA (K-DUR) 20 MEQ tablet Take 1 tablet by mouth once daily 90 tablet 0   No current facility-administered medications for this encounter.     Vitals:   10/14/18 1415  BP: (!) 159/64  Pulse: 83  SpO2: 98%  Weight: 73 kg (161 lb)   Wt Readings from Last 3 Encounters:  08/19/18 72.1 kg (159 lb)  07/09/18 71.8 kg (158 lb 3.2 oz)  11/15/17 73.3 kg (161 lb 9.6 oz)    PHYSICAL EXAM: General:  Well appearing. No resp difficulty HEENT: normal Neck: supple. no JVD. Carotids 2+ bilat; no bruits. No lymphadenopathy or thryomegaly appreciated. Cor: PMI nondisplaced. Regular rate & rhythm. No  rubs, gallops or murmurs. Lungs: clear with decreased breath sounds throughout. No wheeze Abdomen: obese soft, nontender, nondistended. No hepatosplenomegaly. No bruits or masses. Good bowel sounds. Extremities: no cyanosis, clubbing, rash, edema Neuro: alert & oriented x 3, cranial nerves grossly intact. moves all 4 extremities w/o difficulty. Affect pleasant   ASSESSMENT & PLAN:  1. RV failure. - Echo  11/2016 EF 55-60%, grade 1 DD, RV normal - Echo today EF 60-65% RV normal. Personally reviewed - Suspect due to COPD and untreated OSA - No evidence of RV infarct or infiltrative process on cMRI - Mild PAH on cath  - Volume status stable. - Continue 40 mg lasix daily - Off spiro due to hyperkalemia. - Continue oxygen.    2. CAD - s/p inferior MI 2011 with RCA stents - cath with non-obstructive CAD in 6/18 (LAD 20%, dLCX 60%, RCA 30%) - No s/s ischemia - Continue ASA. Unable to tolerate statins. Goal LDL < 70 - Lipids 09/27/18 TC 200 HDL 61 TG 109 LDL 118  - Will try Zetia 10 if fails needs to consider [employer]-9  4. Tobacco use and chronic hypoxic respiratory failure - Quit smoking in July 19, 2016.  - Continue O2. Follows with Dr Melvyn Novas who recently released her to PRN f/u  5. HTN - Has white coat syndrome - Says her BP runs 130-40s at home.  - Will have her keep BP log for next few weeks. Review with Dr. Laurann Montana. Goal SBP 120-140.   6. Palpitations - very intermittent.  - we discussed possible [employer] but she does not have a smart phone - if become more frequent will need a monitor to exclude AF  Glori Bickers, MD  6:21 PM

## 2018-10-14 ENCOUNTER — Ambulatory Visit (HOSPITAL_BASED_OUTPATIENT_CLINIC_OR_DEPARTMENT_OTHER)
Admission: RE | Admit: 2018-10-14 | Discharge: 2018-10-14 | Disposition: A | Discharge status: Home / Self Care | Payer: Medicare Other | Source: Ambulatory Visit | Attending: Internal Medicine | Admitting: Internal Medicine

## 2018-10-14 ENCOUNTER — Other Ambulatory Visit: Payer: Self-pay

## 2018-10-14 ENCOUNTER — Ambulatory Visit (HOSPITAL_COMMUNITY)
Admission: RE | Admit: 2018-10-14 | Discharge: 2018-10-14 | Disposition: A | Discharge status: Home / Self Care | Payer: Medicare Other | Source: Ambulatory Visit | Attending: Internal Medicine | Admitting: Internal Medicine

## 2018-10-14 VITALS — BP 159/64 | HR 83 | Wt 161.0 lb

## 2018-10-14 DIAGNOSIS — J449 Chronic obstructive pulmonary disease, unspecified: Secondary | ICD-10-CM | POA: Diagnosis not present

## 2018-10-14 DIAGNOSIS — F17210 Nicotine dependence, cigarettes, uncomplicated: Secondary | ICD-10-CM | POA: Diagnosis not present

## 2018-10-14 DIAGNOSIS — I50810 Right heart failure, unspecified: Secondary | ICD-10-CM

## 2018-10-14 DIAGNOSIS — I2510 Atherosclerotic heart disease of native coronary artery without angina pectoris: Secondary | ICD-10-CM | POA: Insufficient documentation

## 2018-10-14 DIAGNOSIS — I10 Essential (primary) hypertension: Secondary | ICD-10-CM

## 2018-10-14 DIAGNOSIS — I252 Old myocardial infarction: Secondary | ICD-10-CM | POA: Insufficient documentation

## 2018-10-14 DIAGNOSIS — E785 Hyperlipidemia, unspecified: Secondary | ICD-10-CM | POA: Insufficient documentation

## 2018-10-14 DIAGNOSIS — R002 Palpitations: Secondary | ICD-10-CM | POA: Diagnosis not present

## 2018-10-14 MED ORDER — EZETIMIBE 10 MG PO TABS: 10.0000 mg | ORAL_TABLET | Freq: Every day | ORAL | 3 refills | Status: DC

## 2018-10-14 NOTE — Patient Instructions (Signed)
START ZETIA 10 mg ( 1 tablet ) by mouth daily.   Follow up with Dr. Haroldine Laws in 1 year.  At the [facility], you and your health needs are our priority. As part of our continuing mission to provide you with exceptional heart care, we have created designated Provider Care Teams. These Care Teams include your primary Cardiologist (physician) and Advanced Practice Providers (APPs- Physician Assistants and Nurse Practitioners) who all work together to provide you with the care you need, when you need it.   You may see any of the following providers on your designated Care Team at your next follow up: Marland Kitchen Dr Glori Bickers . Dr Loralie Champagne . Darrick Grinder, NP   Please be sure to bring in all your medications bottles to every appointment.

## 2018-10-17 ENCOUNTER — Other Ambulatory Visit (HOSPITAL_COMMUNITY): Payer: Self-pay | Admitting: *Deleted

## 2018-11-16 ENCOUNTER — Other Ambulatory Visit: Payer: Self-pay | Admitting: Internal Medicine

## 2018-11-21 ENCOUNTER — Encounter: Payer: Self-pay | Admitting: Gynecology

## 2018-12-04 ENCOUNTER — Other Ambulatory Visit (HOSPITAL_COMMUNITY): Payer: Self-pay | Admitting: Internal Medicine

## 2018-12-17 ENCOUNTER — Other Ambulatory Visit: Payer: Self-pay | Admitting: Gynecology

## 2019-01-01 ENCOUNTER — Other Ambulatory Visit: Payer: Self-pay | Admitting: Internal Medicine

## 2019-01-03 ENCOUNTER — Telehealth (HOSPITAL_COMMUNITY): Payer: Self-pay | Admitting: *Deleted

## 2019-01-03 NOTE — Telephone Encounter (Signed)
Received fax from [facility], pt needs clerance to have 3 teeth extracted.  Per Dr Haroldine Laws: pt can proceed  Note faxed back to them at [phone]

## 2019-01-29 ENCOUNTER — Other Ambulatory Visit: Payer: Medicare Other

## 2019-01-31 ENCOUNTER — Ambulatory Visit
Admission: RE | Admit: 2019-01-31 | Discharge: 2019-01-31 | Disposition: A | Discharge status: Home / Self Care | Payer: Medicare Other | Source: Ambulatory Visit | Attending: Family Medicine | Admitting: Family Medicine

## 2019-01-31 ENCOUNTER — Other Ambulatory Visit: Payer: Self-pay

## 2019-01-31 DIAGNOSIS — N6320 Unspecified lump in the left breast, unspecified quadrant: Secondary | ICD-10-CM

## 2019-02-18 ENCOUNTER — Other Ambulatory Visit: Payer: Self-pay | Admitting: Cardiology

## 2019-03-02 ENCOUNTER — Other Ambulatory Visit (HOSPITAL_COMMUNITY): Payer: Self-pay | Admitting: Internal Medicine

## 2019-03-24 ENCOUNTER — Other Ambulatory Visit: Payer: Self-pay | Admitting: Internal Medicine

## 2019-03-31 ENCOUNTER — Telehealth (HOSPITAL_COMMUNITY): Payer: Self-pay | Admitting: *Deleted

## 2019-03-31 NOTE — Telephone Encounter (Signed)
Pt left VM requesting a return call from our office she did not leave a detailed message. I called patient back no answer.

## 2019-04-18 ENCOUNTER — Other Ambulatory Visit (HOSPITAL_COMMUNITY): Payer: Self-pay | Admitting: Cardiology

## 2019-04-18 MED ORDER — POTASSIUM CHLORIDE CRYS ER 20 MEQ PO TBCR: 20.0000 meq | EXTENDED_RELEASE_TABLET | Freq: Every day | ORAL | 3 refills | Status: DC

## 2019-05-05 ENCOUNTER — Telehealth (HOSPITAL_COMMUNITY): Payer: Self-pay

## 2019-05-05 NOTE — Telephone Encounter (Signed)
Received signed medical records release from [place] requesting records to be faxed for continuation of care. Medical records faxzed to [id number]. Release scanned into chart.

## 2019-05-20 ENCOUNTER — Other Ambulatory Visit (HOSPITAL_COMMUNITY): Payer: Self-pay | Admitting: Internal Medicine

## 2019-07-09 ENCOUNTER — Other Ambulatory Visit: Payer: Self-pay | Admitting: *Deleted

## 2019-07-09 MED ORDER — NYSTATIN 100000 UNIT/GM EX CREA: TOPICAL_CREAM | CUTANEOUS | 3 refills | Status: DC

## 2019-07-09 NOTE — Telephone Encounter (Signed)
Per Dr.Fontaine note "Skin rash in her panniculus.  Classic for yeast.  Has used Mycostatin cream in the past and done well with this.  "  Annual exam scheduled 08/22/19

## 2019-07-25 ENCOUNTER — Telehealth: Payer: Self-pay | Admitting: Internal Medicine

## 2019-07-25 NOTE — Telephone Encounter (Signed)
Yes ok to use saba as needed though my records show her technique was poor so if doing worse on prns saba then ov asap to regroup

## 2019-07-25 NOTE — Telephone Encounter (Signed)
Spoke with patient. She verbalized understanding. Nothing further needed at time of call.  

## 2019-07-25 NOTE — Telephone Encounter (Signed)
Spoke with pt, she states after she stopped the Anoro she felt better. She had more energy and her oxygen has been normal. She has her emergency inhaler but she feels things seem to be improving. She just didn't know if not taking the Anoro would damage her lungs? She hasn't made an appt with MW because he told her to come only as needed and her family physician could write for her Anoro. MW please advise if it is ok to stop Anoro.

## 2019-08-19 ENCOUNTER — Other Ambulatory Visit (HOSPITAL_COMMUNITY): Payer: Self-pay | Admitting: Internal Medicine

## 2019-08-22 ENCOUNTER — Ambulatory Visit (INDEPENDENT_AMBULATORY_CARE_PROVIDER_SITE_OTHER): Payer: Medicare Other | Admitting: Obstetrics and Gynecology

## 2019-08-22 ENCOUNTER — Encounter: Payer: Self-pay | Admitting: Obstetrics and Gynecology

## 2019-08-22 ENCOUNTER — Other Ambulatory Visit: Payer: Self-pay

## 2019-08-22 VITALS — BP 146/78 | Ht <= 58 in | Wt 167.0 lb

## 2019-08-22 DIAGNOSIS — Z01419 Encounter for gynecological examination (general) (routine) without abnormal findings: Secondary | ICD-10-CM | POA: Diagnosis not present

## 2019-08-22 DIAGNOSIS — M810 Age-related osteoporosis without current pathological fracture: Secondary | ICD-10-CM | POA: Diagnosis not present

## 2019-08-22 NOTE — Progress Notes (Signed)
Deanna Johnston [facility] 01-Feb-1946 [id number]  SUBJECTIVE:  74 y.o. G0P0 female here for an annual routine gynecologic exam. She has no gynecologic concerns.   Current Outpatient Medications  Medication Sig Dispense Refill  . albuterol (PROAIR HFA) 108 (90 Base) MCG/ACT inhaler 2 puffs every 4 hours as needed only  if your can't catch your breath 1 Inhaler 1  . Ascorbic Acid (VITAMIN C) 100 MG tablet Take 100 mg by mouth daily.    Marland Kitchen aspirin EC 81 MG tablet Take 81 mg by mouth daily.    Marland Kitchen b complex vitamins tablet Take 1 tablet by mouth daily.    . furosemide (LASIX) 40 MG tablet Take 1 tablet (40 mg total) by mouth daily. 90 tablet 3  . Ginger 500 MG CAPS Take by mouth.    . Honey 5 GM/5ML SYRP Take by mouth.    . nitroGLYCERIN (NITROSTAT) 0.4 MG SL tablet DISSOLVE ONE TABLET UNDER THE TONGUE EVERY 5 MINUTES AS NEEDED FOR CHEST PAIN. 25 tablet 2  . nystatin cream (MYCOSTATIN) APPLY 1 APPLICATION OF CREAM TWICE DAILY 30 g 3  . Omega-3 Fatty Acids (OMEGA 3 500 PO) Take by mouth.    . OXYGEN 2lpm with sleep  AHC    . potassium chloride SA (KLOR-CON) 20 MEQ tablet Take 1 tablet (20 mEq total) by mouth daily. Please call for an office visit [phone] 30 tablet 3  . ANORO ELLIPTA 62.5-25 MCG/INH AEPB Inhale 1 puff by mouth once daily (Patient not taking: Reported on 08/22/2019) 60 each 6  . ezetimibe (ZETIA) 10 MG tablet Take 1 tablet (10 mg total) by mouth daily. (Patient not taking: Reported on 08/22/2019) 30 tablet 3   No current facility-administered medications for this visit.   Allergies: Lipitor [atorvastatin], Crestor [rosuvastatin calcium], Prednisone, Sulfa antibiotics, Synthroid [levothyroxine sodium], and Niaspan [niacin]  No LMP recorded. Patient has had a hysterectomy.  Past medical history,surgical history, problem list, medications, allergies, family history and social history were all reviewed and documented as reviewed in the EPIC chart.  ROS:  Feeling well. No dyspnea or chest  pain on exertion.  No abdominal pain, change in bowel habits, black or bloody stools.  No urinary tract symptoms. GYN ROS: no abnormal bleeding, pelvic pain or discharge, no breast pain or new or enlarging lumps on self exam. No neurological complaints.   OBJECTIVE:  BP (!) 146/78   Ht 4\' 9"  (1.448 m)   Wt 167 lb (75.8 kg)   BMI 36.14 kg/m  The patient appears well, alert, oriented x 3, in no distress. ENT normal.  Neck supple. No cervical or supraclavicular adenopathy or thyromegaly.  Lungs are clear, good air entry, no wheezes, rhonchi or rales. S1 and S2 normal, no murmurs, regular rate and rhythm.  Abdomen soft without tenderness, guarding, mass or organomegaly.  Neurological is normal, no focal findings.  BREAST EXAM: breasts appear normal, no suspicious masses, no skin or nipple changes or axillary nodes  PELVIC EXAM: VULVA: normal appearing vulva with no masses, tenderness or lesions, VAGINA: normal appearing vagina with normal color and discharge, no lesions, CERVIX: surgically absent, UTERUS: surgically absent, vaginal cuff normal, ADNEXA: no masses, non tender  Chaperone: Caryn Bee present during the examination   ASSESSMENT:  74 y.o. G0P0 here for annual gynecologic exam  PLAN:   1. Postmenopausal. Prior TAH/BSO 1983.  No significant menopausal symptoms. 2. Pap smear 2019.  Long history of VAIN in the past.  She had high-grade dysplasia in 2011 treated  with Efudex.  Her most recent Pap smears have been normal.  Next Pap smear follow-up is recommended for next year at t(he 3-year interval). 3. Mammogram 01/2019.  Normal breast exam today.  Mammogram shows stable mildly complex cyst in the outer left breast.  Left breast ultrasound is recommended at the time of her annual bilateral diagnostic mammography at the end of the year. 4. Colonoscopy.  Apparently unable to perform due to tortuosity of intestines.  She follows with her primary physician's office using stool samples  for screening. 5. Osteoporosis.  DEXA 2011 T score -3.  We discussed multiple times her increased risk of fracture and options for treatment.  Patient states that she would not take medication and declines any further testing.  6. Health maintenance.  No labs today as she normally has these completed elsewhere.  Blood pressure mildly elevated today, says it typically runs much lower at home and gets nervous coming into the doctor office.  We will continue to monitor at home and follow-up with her primary doctor if persistently elevated systolic greater than 194F or diastolic greater than 12X. Return annually or sooner, prn.  Joseph Pierini MD 08/22/19

## 2019-09-02 ENCOUNTER — Other Ambulatory Visit (HOSPITAL_COMMUNITY): Payer: Self-pay | Admitting: Internal Medicine

## 2019-10-07 ENCOUNTER — Other Ambulatory Visit (HOSPITAL_COMMUNITY): Payer: Self-pay | Admitting: Internal Medicine

## 2019-10-20 NOTE — Progress Notes (Signed)
HF: Dr Haroldine Laws Pulmonary : Dr Melvyn Novas PCP: Dr Kelton Pillar   ADVANCED HF CLINIC NOTE  HPI: Deanna Johnston) is a 74 y/o female with PMH of COPD with ongoing tobacco use, CAD s/p NSTEMI with RCA stents in 2011 and obesity.  Admitted to [facility] on 07/20/16 with PNA and severe cor pulmonale. Echo 07/21/16: LV EF 65-70% with dilated RV and moderate RV HK. Started on dopamine for RV support. CT chest no PE. Diuresed well and renal function improved. Cath with mild PAH after optimization.   Today she returns for HF follow up. Feels pretty good. SOB with mild exertion. Says it is a little worse. Blames it on her back (scoliosis). Only wearing oxygen at night. No edema, orthopnea or PND. SBP 120-140s at home. Weight stable 1591-60. Mild wheezing. Check sats 93% at rest. Down to 89% at rest.    Echo 8/20 EF 60-65% RV normal.    Testing   PFTS 8/18 FEV1  1.2 (74%) FVC   1.9 87%)  FEF 25-75 45% DLCO 58% Moderate obstruction with moderate to severe diffusion defect.   Echo 07/21/16: LV EF 65-70% with dilated RV and moderate RV HK. Started on dopamine for RV support. CT chest no PE. Diuresed well and renal function improved.  11/2016  LV function remains normal. RV now improved with normal size and function.  Underwent RHC/LHC on 07/26/16   Mid RCA to Dist RCA lesion, 0 %stenosed.  Mid RCA lesion, 20 %stenosed.  Dist RCA lesion, 30 %stenosed.  Dist Cx lesion, 60 %stenosed.  Prox LAD to Mid LAD lesion, 20 %stenosed.  The left ventricular ejection fraction is greater than 65% by visual estimate. Findings: Done on dopamine 50mcg/kg/min Ao = 110/47 (72) LV = 120/10 RA = 5 RV = 47/7 PA = 42/16 (27) PCW = 6 Fick cardiac output/index = 5.7/3.4 PVR = 4.2 WU SVR 945 Ao sat = 88% PA sat = 71%, 69% High SVC sat = 69% Assessment: 1. Heavily calcified coronary arteries with only minimal luminal CAD. Widely patent RCA stent 2. LVEF 70% without regional wall motion abnormalities 3.  Mild PAH with normal cardiac output on low-dose dopamine 4. No intracardiac shunt 4. PAD - central aortic pressure ~2mmHG greater than radial artery pressure or cuff pressure  cMRI done to evaluate RV dysfunction - LVEF 77% No RV infarct. Possible infiltrative process but not classic amyloid process. No RV scar  SPEP UPEP negative. (cMRI reviewed with Dr. Meda Coffee and not felt to have infiltrative process)  SH:  Social History   Socioeconomic History  . Marital status: Single    Spouse name: Not on file  . Number of children: Not on file  . Years of education: Not on file  . Highest education level: Not on file  Occupational History  . Occupation: Retired    Fish farm manager: RETIRED  Tobacco Use  . Smoking status: Former Smoker    Packs/day: 0.50    Years: 47.00    Pack years: 23.50    Types: Cigarettes    Quit date: 07/20/2016    Years since quitting: 3.2  . Smokeless tobacco: Never Used  Vaping Use  . Vaping Use: Never used  Substance and Sexual Activity  . Alcohol use: Yes    Alcohol/week: 0.0 standard drinks    Comment: Rare  . Drug use: No  . Sexual activity: Never    Birth control/protection: Post-menopausal, Surgical    Comment: HYST-1st intercourse 74 yo-More than 5 partners  Other  Topics Concern  . Not on file  Social History Narrative  . Not on file   Social Determinants of Health   Financial Resource Strain:   . Difficulty of Paying Living Expenses: Not on file  Food Insecurity:   . Worried About Charity fundraiser in the Last Year: Not on file  . Ran Out of Food in the Last Year: Not on file  Transportation Needs:   . Lack of Transportation (Medical): Not on file  . Lack of Transportation (Non-Medical): Not on file  Physical Activity:   . Days of Exercise per Week: Not on file  . Minutes of Exercise per Session: Not on file  Stress:   . Feeling of Stress : Not on file  Social Connections:   . Frequency of Communication with Friends and Family: Not on file   . Frequency of Social Gatherings with Friends and Family: Not on file  . Attends Religious Services: Not on file  . Active Member of Clubs or Organizations: Not on file  . Attends Archivist Meetings: Not on file  . Marital Status: Not on file  Intimate Partner Violence:   . Fear of Current or Ex-Partner: Not on file  . Emotionally Abused: Not on file  . Physically Abused: Not on file  . Sexually Abused: Not on file    FH:  Family History  Problem Relation Age of Onset  . Heart failure Mother   . Stomach cancer Mother   . Heart disease Father   . Diabetes Father   . Colon cancer Sister   . Heart disease Brother     Past Medical History:  Diagnosis Date  . Cataract   . Cervical dysplasia   . COPD (chronic obstructive pulmonary disease) ([place])   . Coronary artery disease   . Hyperlipidemia   . MI (myocardial infarction) [place]) Dec. 2011   stent mid right coronary  . Obesity   . Tobacco dependence   . VAIN (vaginal intraepithelial neoplasia) 2011   Efudex treatment    Current Outpatient Medications  Medication Sig Dispense Refill  . albuterol (PROAIR HFA) 108 (90 Base) MCG/ACT inhaler 2 puffs every 4 hours as needed only  if your can't catch your breath 1 Inhaler 1  . ANORO ELLIPTA 62.5-25 MCG/INH AEPB Inhale 1 puff by mouth once daily (Patient not taking: Reported on 08/22/2019) 60 each 6  . Ascorbic Acid (VITAMIN C) 100 MG tablet Take 100 mg by mouth daily.    Marland Kitchen aspirin EC 81 MG tablet Take 81 mg by mouth daily.    Marland Kitchen b complex vitamins tablet Take 1 tablet by mouth daily.    Marland Kitchen ezetimibe (ZETIA) 10 MG tablet Take 1 tablet (10 mg total) by mouth daily. (Patient not taking: Reported on 08/22/2019) 30 tablet 3  . furosemide (LASIX) 40 MG tablet Take 1 tablet (40 mg total) by mouth daily. 90 tablet 3  . Ginger 500 MG CAPS Take by mouth.    . Honey 5 GM/5ML SYRP Take by mouth.    . nitroGLYCERIN (NITROSTAT) 0.4 MG SL tablet DISSOLVE ONE TABLET UNDER THE TONGUE EVERY 5  MINUTES AS NEEDED FOR CHEST PAIN. 25 tablet 2  . nystatin cream (MYCOSTATIN) APPLY 1 APPLICATION OF CREAM TWICE DAILY 30 g 3  . Omega-3 Fatty Acids (OMEGA 3 500 PO) Take by mouth.    . OXYGEN 2lpm with sleep  AHC    . potassium chloride SA (KLOR-CON) 20 MEQ tablet TAKE 1 TABLET  BY MOUTH ONCE DAILY. APPT REQUIRED FOR FUTURE REFILLS ([phone]) 30 tablet 0   No current facility-administered medications for this encounter.    Vitals:   10/21/19 1443  BP: (!) 165/100  Pulse: 93  SpO2: 93%  Weight: 73.8 kg (162 lb 9.6 oz)   Wt Readings from Last 3 Encounters:  08/22/19 75.8 kg (167 lb)  10/14/18 73 kg (161 lb)  08/19/18 72.1 kg (159 lb)    PHYSICAL EXAM: General:  Obese No resp difficulty HEENT: normal Neck: supple. no JVD. Carotids 2+ bilat; no bruits. No lymphadenopathy or thryomegaly appreciated. Cor: PMI nondisplaced. Regular rate & rhythm. No rubs, gallops or murmurs. Lungs: clear with decreased air movement throguhout Abdomen: obese soft, nontender, nondistended. No hepatosplenomegaly. No bruits or masses. Good bowel sounds. Extremities: no cyanosis, clubbing, rash, edema Neuro: alert & orientedx3, cranial nerves grossly intact. moves all 4 extremities w/o difficulty. Affect pleasant   Hall walk: O2 sat 93% at rest 87% with hallwalk.   ECG: NSR 91 with occasional PVCs Personally reviewed   ASSESSMENT & PLAN:  1. PAH/RV failure. - Echo 11/2016 EF 55-60%, grade 1 DD, RV normal - Echo 8/20 EF 60-65% RV normal. - Suspect due to COPD and untreated OSA - No evidence of RV infarct or infiltrative process on cMRI 6/18 - Mild PAH on cath 6/18 - Stable NYHA III. Suspect major limitation due to lung disease and not PAH or HF. - Volume status ok  - Continue 40 mg lasix daily - Off spiro due to hyperkalemia. - Continue oxygen.    2. CAD - s/p inferior MI 2011 with RCA stents - cath with non-obstructive CAD in 6/18 (LAD 20%, dLCX 60%, RCA 30%) - No s/s of ischemia -  Continue ASA. Unable to tolerate statins. Goal LDL < 70 - Continue zetia.  - If LDL remains > 70 will need referral to lipid clinic for PCSK-9 inhibitor  4. Tobacco use and chronic hypoxic respiratory failure - Quit smoking in July 19, 2016.  - Continue O2. Follows with Dr Melvyn Novas who recently released her to PRN f/u - Suspect she is more out of shape after COVID - Have recommended Pulmonary Rehab but she refuses to get COVID vaccine so unable to participate. I stressed need for vaccine - With exertional desats have strongly suggested wearing O2 with activity  5. HTN - Has white coat syndrome - BP high here but ok at home.    Glori Bickers, MD  10:31 PM

## 2019-10-21 ENCOUNTER — Ambulatory Visit (HOSPITAL_COMMUNITY)
Admission: RE | Admit: 2019-10-21 | Discharge: 2019-10-21 | Disposition: A | Discharge status: Home / Self Care | Payer: Medicare Other | Source: Ambulatory Visit | Attending: Internal Medicine | Admitting: Internal Medicine

## 2019-10-21 ENCOUNTER — Other Ambulatory Visit: Payer: Self-pay

## 2019-10-21 VITALS — BP 165/100 | HR 93 | Wt 162.6 lb

## 2019-10-21 DIAGNOSIS — Z7982 Long term (current) use of aspirin: Secondary | ICD-10-CM | POA: Insufficient documentation

## 2019-10-21 DIAGNOSIS — J449 Chronic obstructive pulmonary disease, unspecified: Secondary | ICD-10-CM | POA: Insufficient documentation

## 2019-10-21 DIAGNOSIS — Z87891 Personal history of nicotine dependence: Secondary | ICD-10-CM | POA: Diagnosis not present

## 2019-10-21 DIAGNOSIS — I50810 Right heart failure, unspecified: Secondary | ICD-10-CM | POA: Diagnosis not present

## 2019-10-21 DIAGNOSIS — I110 Hypertensive heart disease with heart failure: Secondary | ICD-10-CM | POA: Insufficient documentation

## 2019-10-21 DIAGNOSIS — E875 Hyperkalemia: Secondary | ICD-10-CM | POA: Diagnosis not present

## 2019-10-21 DIAGNOSIS — Z8249 Family history of ischemic heart disease and other diseases of the circulatory system: Secondary | ICD-10-CM | POA: Diagnosis not present

## 2019-10-21 DIAGNOSIS — Z955 Presence of coronary angioplasty implant and graft: Secondary | ICD-10-CM | POA: Diagnosis not present

## 2019-10-21 DIAGNOSIS — J9610 Chronic respiratory failure, unspecified whether with hypoxia or hypercapnia: Secondary | ICD-10-CM

## 2019-10-21 DIAGNOSIS — Z6835 Body mass index (BMI) 35.0-35.9, adult: Secondary | ICD-10-CM | POA: Insufficient documentation

## 2019-10-21 DIAGNOSIS — J9611 Chronic respiratory failure with hypoxia: Secondary | ICD-10-CM | POA: Diagnosis not present

## 2019-10-21 DIAGNOSIS — I509 Heart failure, unspecified: Secondary | ICD-10-CM

## 2019-10-21 DIAGNOSIS — E785 Hyperlipidemia, unspecified: Secondary | ICD-10-CM | POA: Insufficient documentation

## 2019-10-21 DIAGNOSIS — I2721 Secondary pulmonary arterial hypertension: Secondary | ICD-10-CM | POA: Insufficient documentation

## 2019-10-21 DIAGNOSIS — Z9981 Dependence on supplemental oxygen: Secondary | ICD-10-CM | POA: Diagnosis not present

## 2019-10-21 DIAGNOSIS — Z79899 Other long term (current) drug therapy: Secondary | ICD-10-CM | POA: Insufficient documentation

## 2019-10-21 DIAGNOSIS — I252 Old myocardial infarction: Secondary | ICD-10-CM | POA: Diagnosis not present

## 2019-10-21 DIAGNOSIS — I2510 Atherosclerotic heart disease of native coronary artery without angina pectoris: Secondary | ICD-10-CM | POA: Diagnosis not present

## 2019-10-21 DIAGNOSIS — E669 Obesity, unspecified: Secondary | ICD-10-CM | POA: Diagnosis not present

## 2019-10-21 NOTE — Patient Instructions (Signed)
Please follow up with your Pulmonary doctor to schedule an appointment with them  Wear your Oxygen at all times  Once you get the Vaccine, please give our office a call and we will refer you to  Pulmonary Rehab.   Please call our office in September 2022 to schedule your follow up appointment with an echocardiogram  Your physician has requested that you have an echocardiogram. Echocardiography is a painless test that uses sound waves to create images of your heart. It provides your doctor with information about the size and shape of your heart and how well your heart's chambers and valves are working. This procedure takes approximately one hour. There are no restrictions for this procedure.  If you have any questions or concerns before your next appointment please send [place] a message through [place] or call our office at [phone].    TO LEAVE A MESSAGE FOR THE NURSE SELECT OPTION 2, PLEASE LEAVE A MESSAGE INCLUDING: . YOUR NAME . DATE OF BIRTH . CALL BACK NUMBER . REASON FOR CALL**this is important as we prioritize the call backs  [place] AS LONG AS YOU CALL BEFORE 4:00 PM  At the [facility], you and your health needs are our priority. As part of our continuing mission to provide you with exceptional heart care, we have created designated Provider Care Teams. These Care Teams include your primary Cardiologist (physician) and Advanced Practice Providers (APPs- Physician Assistants and Nurse Practitioners) who all work together to provide you with the care you need, when you need it.   You may see any of the following providers on your designated Care Team at your next follow up: Marland Kitchen Dr Glori Bickers . Dr Loralie Champagne . Darrick Grinder, NP . Lyda Jester, PA . Audry Riles, PharmD   Please be sure to bring in all your medications bottles to every appointment.

## 2019-11-04 ENCOUNTER — Other Ambulatory Visit (HOSPITAL_COMMUNITY): Payer: Self-pay | Admitting: Internal Medicine

## 2019-11-25 ENCOUNTER — Ambulatory Visit: Payer: Medicare Other | Attending: Internal Medicine

## 2019-11-25 DIAGNOSIS — Z23 Encounter for immunization: Secondary | ICD-10-CM

## 2019-11-25 NOTE — Progress Notes (Signed)
   Covid-19 Vaccination Clinic  Name:  Deanna Johnston    MRN: [id number] DOB: 1945-12-30  11/25/2019  [name] was observed post Covid-19 immunization for 30 minutes based on pre-vaccination screening without incident. She was provided with Vaccine Information Sheet and instruction to access the V-Safe system.   [name] was instructed to call 911 with any severe reactions post vaccine: Marland Kitchen Difficulty breathing  . Swelling of face and throat  . A fast heartbeat  . A bad rash all over body  . Dizziness and weakness   Immunizations Administered    Name Date Dose VIS Date Route   JANSSEN COVID-19 VACCINE 11/25/2019 10:03 AM 0.5 mL 04/12/2019 Intramuscular   Manufacturer: [employer]   Lot: [id number]   [place]: [phone]

## 2019-11-28 ENCOUNTER — Other Ambulatory Visit: Payer: Self-pay | Admitting: Family Medicine

## 2019-11-28 DIAGNOSIS — N6002 Solitary cyst of left breast: Secondary | ICD-10-CM

## 2020-02-02 ENCOUNTER — Ambulatory Visit
Admission: RE | Admit: 2020-02-02 | Discharge: 2020-02-02 | Disposition: A | Discharge status: Home / Self Care | Payer: Medicare Other | Source: Ambulatory Visit | Attending: Family Medicine | Admitting: Family Medicine

## 2020-02-02 ENCOUNTER — Other Ambulatory Visit: Payer: Self-pay

## 2020-02-02 DIAGNOSIS — N6002 Solitary cyst of left breast: Secondary | ICD-10-CM

## 2020-02-29 DIAGNOSIS — J9601 Acute respiratory failure with hypoxia: Secondary | ICD-10-CM | POA: Diagnosis not present

## 2020-03-31 DIAGNOSIS — J9601 Acute respiratory failure with hypoxia: Secondary | ICD-10-CM | POA: Diagnosis not present

## 2020-04-27 DIAGNOSIS — Z1389 Encounter for screening for other disorder: Secondary | ICD-10-CM | POA: Diagnosis not present

## 2020-04-27 DIAGNOSIS — Z9111 Patient's noncompliance with dietary regimen: Secondary | ICD-10-CM | POA: Diagnosis not present

## 2020-04-27 DIAGNOSIS — M810 Age-related osteoporosis without current pathological fracture: Secondary | ICD-10-CM | POA: Diagnosis not present

## 2020-04-27 DIAGNOSIS — J449 Chronic obstructive pulmonary disease, unspecified: Secondary | ICD-10-CM | POA: Diagnosis not present

## 2020-04-27 DIAGNOSIS — E039 Hypothyroidism, unspecified: Secondary | ICD-10-CM | POA: Diagnosis not present

## 2020-04-27 DIAGNOSIS — G720 Drug-induced myopathy: Secondary | ICD-10-CM | POA: Diagnosis not present

## 2020-04-27 DIAGNOSIS — Z0000 Encounter for general adult medical examination without abnormal findings: Secondary | ICD-10-CM | POA: Diagnosis not present

## 2020-04-27 DIAGNOSIS — I2510 Atherosclerotic heart disease of native coronary artery without angina pectoris: Secondary | ICD-10-CM | POA: Diagnosis not present

## 2020-04-27 DIAGNOSIS — I509 Heart failure, unspecified: Secondary | ICD-10-CM | POA: Diagnosis not present

## 2020-04-27 DIAGNOSIS — E7800 Pure hypercholesterolemia, unspecified: Secondary | ICD-10-CM | POA: Diagnosis not present

## 2020-04-27 DIAGNOSIS — Z23 Encounter for immunization: Secondary | ICD-10-CM | POA: Diagnosis not present

## 2020-04-27 DIAGNOSIS — I119 Hypertensive heart disease without heart failure: Secondary | ICD-10-CM | POA: Diagnosis not present

## 2020-04-28 DIAGNOSIS — J9601 Acute respiratory failure with hypoxia: Secondary | ICD-10-CM | POA: Diagnosis not present

## 2020-05-29 DIAGNOSIS — J9601 Acute respiratory failure with hypoxia: Secondary | ICD-10-CM | POA: Diagnosis not present

## 2020-06-10 ENCOUNTER — Other Ambulatory Visit: Payer: Self-pay | Admitting: Cardiology

## 2020-06-28 DIAGNOSIS — J9601 Acute respiratory failure with hypoxia: Secondary | ICD-10-CM | POA: Diagnosis not present

## 2020-07-20 DIAGNOSIS — H40053 Ocular hypertension, bilateral: Secondary | ICD-10-CM | POA: Diagnosis not present

## 2020-07-29 DIAGNOSIS — J9601 Acute respiratory failure with hypoxia: Secondary | ICD-10-CM | POA: Diagnosis not present

## 2020-08-27 ENCOUNTER — Other Ambulatory Visit (HOSPITAL_COMMUNITY): Payer: Self-pay

## 2020-08-27 DIAGNOSIS — I509 Heart failure, unspecified: Secondary | ICD-10-CM

## 2020-08-27 NOTE — Progress Notes (Signed)
Orders Placed This Encounter  Procedures   ECHOCARDIOGRAM COMPLETE    Standing Status:   Future    Standing Expiration Date:   08/27/2021    Order Specific Question:   Where should this test be performed    Answer:   [facility]    Order Specific Question:   Perflutren DEFINITY (image enhancing agent) should be administered unless hypersensitivity or allergy exist    Answer:   Administer Perflutren    Order Specific Question:   Reason for exam-Echo    Answer:   Congestive Heart Failure  I50.9    Order Specific Question:   Release to patient    Answer:   Immediate

## 2020-08-28 DIAGNOSIS — J9601 Acute respiratory failure with hypoxia: Secondary | ICD-10-CM | POA: Diagnosis not present

## 2020-08-31 ENCOUNTER — Encounter: Payer: Self-pay | Admitting: Obstetrics & Gynecology

## 2020-08-31 ENCOUNTER — Other Ambulatory Visit: Payer: Self-pay

## 2020-08-31 ENCOUNTER — Ambulatory Visit (INDEPENDENT_AMBULATORY_CARE_PROVIDER_SITE_OTHER): Payer: Medicare Other | Admitting: Obstetrics & Gynecology

## 2020-08-31 VITALS — BP 130/78 | HR 81 | Resp 16 | Ht <= 58 in | Wt 161.0 lb

## 2020-08-31 DIAGNOSIS — Z780 Asymptomatic menopausal state: Secondary | ICD-10-CM

## 2020-08-31 DIAGNOSIS — Z90722 Acquired absence of ovaries, bilateral: Secondary | ICD-10-CM

## 2020-08-31 DIAGNOSIS — Z01419 Encounter for gynecological examination (general) (routine) without abnormal findings: Secondary | ICD-10-CM

## 2020-08-31 DIAGNOSIS — B372 Candidiasis of skin and nail: Secondary | ICD-10-CM

## 2020-08-31 DIAGNOSIS — Z6835 Body mass index (BMI) 35.0-35.9, adult: Secondary | ICD-10-CM

## 2020-08-31 DIAGNOSIS — Z9079 Acquired absence of other genital organ(s): Secondary | ICD-10-CM

## 2020-08-31 DIAGNOSIS — Z90710 Acquired absence of both cervix and uterus: Secondary | ICD-10-CM | POA: Diagnosis not present

## 2020-08-31 DIAGNOSIS — M810 Age-related osteoporosis without current pathological fracture: Secondary | ICD-10-CM | POA: Diagnosis not present

## 2020-08-31 MED ORDER — NYSTATIN 100000 UNIT/GM EX CREA: TOPICAL_CREAM | CUTANEOUS | 3 refills | Status: DC

## 2020-08-31 NOTE — Progress Notes (Signed)
Deanna Johnston [birth year] [id number]   History:    75 y.o. G0  RP:  Established patient presenting for annual gyn exam   HPI: Postmenopausal. Prior TAH/BSO 1983.  No significant menopausal symptoms.  Pap smears have been normal, last in 2019.  Next Pap at 5 years. Long history of VAIN in the past.  She had high-grade dysplasia in 2011 treated with Efudex.  Breasts normal.  Mammogram 01/2020 Benign.   Colonoscopy unable to perform due to tortuosity of intestines.  She follows with her primary physician's office using stool samples for screening.  Osteoporosis.  DEXA 2011 T score -3. Patient states that she would not take medication and declines any further testing.  BMI 35.46.  Health labs with Fam MD.   Past medical history,surgical history, family history and social history were all reviewed and documented in the EPIC chart.  Gynecologic History No LMP recorded. Patient has had a hysterectomy.  Obstetric History OB History  Gravida Para Term Preterm AB Living  0            SAB IAB Ectopic Multiple Live Births                ROS: A ROS was performed and pertinent positives and negatives are included in the history.  GENERAL: No fevers or chills. HEENT: No change in vision, no earache, sore throat or sinus congestion. NECK: No pain or stiffness. CARDIOVASCULAR: No chest pain or pressure. No palpitations. PULMONARY: No shortness of breath, cough or wheeze. GASTROINTESTINAL: No abdominal pain, nausea, vomiting or diarrhea, melena or bright red blood per rectum. GENITOURINARY: No urinary frequency, urgency, hesitancy or dysuria. MUSCULOSKELETAL: No joint or muscle pain, no back pain, no recent trauma. DERMATOLOGIC: No rash, no itching, no lesions. ENDOCRINE: No polyuria, polydipsia, no heat or cold intolerance. No recent change in weight. HEMATOLOGICAL: No anemia or easy bruising or bleeding. NEUROLOGIC: No headache, seizures, numbness, tingling or weakness. PSYCHIATRIC: No depression, no  loss of interest in normal activity or change in sleep pattern.     Exam:   BP 130/78   Pulse 81   Resp 16   Ht 4' 8.5" (1.435 m)   Wt 161 lb (73 kg)   BMI 35.46 kg/m   Body mass index is 35.46 kg/m.  General appearance : Well developed well nourished female. No acute distress  Abdomen: no palpable masses or tenderness, no rebound or guarding  Extremities: no edema or skin discoloration or tenderness  Pelvic: Vulva: Normal             Vagina: No gross lesions or discharge  Cervix/Uterus absent  Adnexa  Without masses or tenderness  Anus: Normal   Assessment/Plan:  75 y.o. female for annual exam   1. Encounter for gynecological examination without abnormal finding Gynecologic exam status post TAH/BSO.  Remote history of VAIN.  Last Pap test negative in 2019.  We will repeat a Pap test at 5 years in 2024.  Breast exam declined by patient, states that her family doctor will do it.  Screening mammogram every year, last mammogram was benign in December 2021.  Health labs with family physician.  Not doing colonoscopy because of tortuosity of the intestines.  Hemoccults with family physician.  2. S/P TAH-BSO  3. Postmenopause Well on no hormone replacement therapy.  4. Postmenopausal osteoporosis Recommend vitamin D supplements, calcium intake of 1.5 g/day total and regular weightbearing physical activities.  Declines medical therapy and further bone densities.  5. Yeast  infection of the skin Yeast infection at skin folds.  Nystatin cream represcribed.  6. Class 2 severe obesity due to excess calories with serious comorbidity and body mass index (BMI) of 35.0 to 35.9 in adult [facility]) Recommend a lower calorie/carb diet.  Aerobic activities 5 times a week and light weightlifting every 2 days.  Other orders - Vitamin D, Ergocalciferol, (DRISDOL) 1.25 MG (50000 UNIT) CAPS capsule; Take 50,000 Units by mouth once a week. - nystatin cream (MYCOSTATIN); APPLY 1 APPLICATION OF  CREAM TWICE DAILY  Princess Bruins MD, 12:27 PM 08/31/2020

## 2020-09-01 ENCOUNTER — Other Ambulatory Visit (HOSPITAL_COMMUNITY): Payer: Self-pay | Admitting: Internal Medicine

## 2020-09-23 DIAGNOSIS — H0011 Chalazion right upper eyelid: Secondary | ICD-10-CM | POA: Diagnosis not present

## 2020-09-28 DIAGNOSIS — J9601 Acute respiratory failure with hypoxia: Secondary | ICD-10-CM | POA: Diagnosis not present

## 2020-10-07 DIAGNOSIS — H0011 Chalazion right upper eyelid: Secondary | ICD-10-CM | POA: Diagnosis not present

## 2020-10-26 ENCOUNTER — Ambulatory Visit (HOSPITAL_COMMUNITY)
Admission: RE | Admit: 2020-10-26 | Discharge: 2020-10-26 | Disposition: A | Discharge status: Home / Self Care | Payer: Medicare Other | Source: Ambulatory Visit | Attending: Internal Medicine | Admitting: Internal Medicine

## 2020-10-26 ENCOUNTER — Encounter (HOSPITAL_COMMUNITY): Payer: Self-pay | Admitting: Internal Medicine

## 2020-10-26 ENCOUNTER — Other Ambulatory Visit: Payer: Self-pay

## 2020-10-26 ENCOUNTER — Ambulatory Visit (HOSPITAL_BASED_OUTPATIENT_CLINIC_OR_DEPARTMENT_OTHER)
Admission: RE | Admit: 2020-10-26 | Discharge: 2020-10-26 | Disposition: A | Discharge status: Home / Self Care | Payer: Medicare Other | Source: Ambulatory Visit | Attending: Internal Medicine | Admitting: Internal Medicine

## 2020-10-26 VITALS — BP 190/90 | HR 77 | Wt 152.8 lb

## 2020-10-26 DIAGNOSIS — Z833 Family history of diabetes mellitus: Secondary | ICD-10-CM | POA: Diagnosis not present

## 2020-10-26 DIAGNOSIS — I50812 Chronic right heart failure: Secondary | ICD-10-CM

## 2020-10-26 DIAGNOSIS — E785 Hyperlipidemia, unspecified: Secondary | ICD-10-CM | POA: Insufficient documentation

## 2020-10-26 DIAGNOSIS — I700 Atherosclerosis of aorta: Secondary | ICD-10-CM | POA: Diagnosis not present

## 2020-10-26 DIAGNOSIS — E669 Obesity, unspecified: Secondary | ICD-10-CM | POA: Diagnosis not present

## 2020-10-26 DIAGNOSIS — J449 Chronic obstructive pulmonary disease, unspecified: Secondary | ICD-10-CM | POA: Diagnosis not present

## 2020-10-26 DIAGNOSIS — Z79899 Other long term (current) drug therapy: Secondary | ICD-10-CM | POA: Diagnosis not present

## 2020-10-26 DIAGNOSIS — I10 Essential (primary) hypertension: Secondary | ICD-10-CM | POA: Diagnosis not present

## 2020-10-26 DIAGNOSIS — E782 Mixed hyperlipidemia: Secondary | ICD-10-CM | POA: Diagnosis not present

## 2020-10-26 DIAGNOSIS — I110 Hypertensive heart disease with heart failure: Secondary | ICD-10-CM | POA: Diagnosis not present

## 2020-10-26 DIAGNOSIS — Z8249 Family history of ischemic heart disease and other diseases of the circulatory system: Secondary | ICD-10-CM | POA: Insufficient documentation

## 2020-10-26 DIAGNOSIS — I50810 Right heart failure, unspecified: Secondary | ICD-10-CM

## 2020-10-26 DIAGNOSIS — Z955 Presence of coronary angioplasty implant and graft: Secondary | ICD-10-CM | POA: Diagnosis not present

## 2020-10-26 DIAGNOSIS — Z87891 Personal history of nicotine dependence: Secondary | ICD-10-CM | POA: Diagnosis not present

## 2020-10-26 DIAGNOSIS — J9611 Chronic respiratory failure with hypoxia: Secondary | ICD-10-CM | POA: Diagnosis not present

## 2020-10-26 DIAGNOSIS — I2721 Secondary pulmonary arterial hypertension: Secondary | ICD-10-CM | POA: Diagnosis not present

## 2020-10-26 DIAGNOSIS — I509 Heart failure, unspecified: Secondary | ICD-10-CM

## 2020-10-26 DIAGNOSIS — Z7982 Long term (current) use of aspirin: Secondary | ICD-10-CM | POA: Insufficient documentation

## 2020-10-26 DIAGNOSIS — I2510 Atherosclerotic heart disease of native coronary artery without angina pectoris: Secondary | ICD-10-CM | POA: Diagnosis not present

## 2020-10-26 DIAGNOSIS — I252 Old myocardial infarction: Secondary | ICD-10-CM | POA: Diagnosis not present

## 2020-10-26 LAB — LIPID PANEL
Cholesterol: 201 mg/dL — ABNORMAL HIGH (ref 0–200)
HDL: 64 mg/dL (ref >40)
LDL Cholesterol: 104 mg/dL — ABNORMAL HIGH (ref 0–99)
Total CHOL/HDL Ratio: 3.1 RATIO
Triglycerides: 165 mg/dL — ABNORMAL HIGH (ref <150)
VLDL: 33 mg/dL (ref 0–40)

## 2020-10-26 LAB — COMPREHENSIVE METABOLIC PANEL
ALT: 16 U/L (ref 0–44)
AST: 21 U/L (ref 15–41)
Albumin: 3.9 g/dL (ref 3.5–5.0)
Alkaline Phosphatase: 98 U/L (ref 38–126)
Anion gap: 10 (ref 5–15)
BUN: 19 mg/dL (ref 8–23)
CO2: 25 mmol/L (ref 22–32)
Calcium: 9.5 mg/dL (ref 8.9–10.3)
Chloride: 103 mmol/L (ref 98–111)
Creatinine, Ser: 0.88 mg/dL (ref 0.44–1.00)
GFR, Estimated: >60 mL/min (ref >60)
Glucose, Bld: 99 mg/dL (ref 70–99)
Potassium: 3.3 mmol/L — ABNORMAL LOW (ref 3.5–5.1)
Sodium: 138 mmol/L (ref 135–145)
Total Bilirubin: 0.8 mg/dL (ref 0.3–1.2)
Total Protein: 7.7 g/dL (ref 6.5–8.1)

## 2020-10-26 LAB — BRAIN NATRIURETIC PEPTIDE: B Natriuretic Peptide: 72.1 pg/mL (ref 0.0–100.0)

## 2020-10-26 LAB — ECHOCARDIOGRAM COMPLETE
Area-P 1/2: 3.65 cm2
S' Lateral: 2.9 cm

## 2020-10-26 MED ORDER — LOSARTAN POTASSIUM 50 MG PO TABS: 50.0000 mg | ORAL_TABLET | Freq: Every day | ORAL | 3 refills | Status: DC

## 2020-10-26 NOTE — Patient Instructions (Signed)
Start Losartan 50 mg Daily  Labs done today, your results will be available in MyChart, we will contact you for abnormal readings.  You have been referred to the [facility], they will call you for an appointment  You have been referred to follow back up with [facility], they will call you for an appointment  Please call our office in 1 year to schedule your follow up appointment  If you have any questions or concerns before your next appointment please send [place] a message through [facility] or call our office at [phone].    TO LEAVE A MESSAGE FOR THE NURSE SELECT OPTION 2, PLEASE LEAVE A MESSAGE INCLUDING: YOUR NAME DATE OF BIRTH CALL BACK NUMBER REASON FOR CALL**this is important as we prioritize the call backs  YOU WILL RECEIVE A CALL BACK THE SAME DAY AS LONG AS YOU CALL BEFORE 4:00 PM  At the [facility], you and your health needs are our priority. As part of our continuing mission to provide you with exceptional heart care, we have created designated Provider Care Teams. These Care Teams include your primary Cardiologist (physician) and Advanced Practice Providers (APPs- Physician Assistants and Nurse Practitioners) who all work together to provide you with the care you need, when you need it.   You may see any of the following providers on your designated Care Team at your next follow up: Dr Glori Bickers Dr Loralie Champagne Dr Patrice Paradise, NP Lyda Jester, [place] Ginnie Smart Audry Riles, PharmD   Please be sure to bring in all your medications bottles to every appointment.

## 2020-10-26 NOTE — Progress Notes (Signed)
HF: Dr Haroldine Laws Pulmonary : Dr Melvyn Novas PCP: Dr Kelton Pillar   ADVANCED HF CLINIC NOTE  HPI: Deanna Johnston) is a 75 y/o female with PMH of COPD with ongoing tobacco use, CAD s/p NSTEMI with RCA stents in 2011 and obesity.   Admitted to [facility] on 07/20/16 with PNA and severe cor pulmonale. Echo 07/21/16: LV EF 65-70% with dilated RV and moderate RV HK. Started on dopamine for RV support. CT chest no PE. Diuresed well and renal function improved. Cath with mild PAH after optimization.   Echo 8/20 EF 60-65% RV normal.   Today she returns for HF follow up.  BP today 190/90 Weighs 152.78 lbs  Resistnent to changing nutrition Uses 2L oxygen at night, none during the day. Does not wear her O2 in the house even though it was recommended when she exerts herself.  Can walk short distance without feeling short of breath  No orthopnea  No increased swelling.  Lasix daily and potassium  BP at home averages 140/70. States when not worried goes to 125/60s  States that she does not want to take a blood pressure med Could not tolerate statin    Testing   PFTS 8/18 FEV1  1.2 (74%) FVC   1.9 87%)  FEF 25-75 45% DLCO 58% Moderate obstruction with moderate to severe diffusion defect.   Echo 07/21/16: LV EF 65-70% with dilated RV and moderate RV HK. Started on dopamine for RV support. CT chest no PE. Diuresed well and renal function improved.  11/2016  LV function remains normal. RV now improved with normal size and function.  Underwent RHC/LHC on 07/26/16  Mid RCA to Dist RCA lesion, 0 %stenosed. Mid RCA lesion, 20 %stenosed. Dist RCA lesion, 30 %stenosed. Dist Cx lesion, 60 %stenosed. Prox LAD to Mid LAD lesion, 20 %stenosed. The left ventricular ejection fraction is greater than 65% by visual estimate.  Findings:  Done on dopamine 63mg/kg/min  Ao = 110/47 (72) LV = 120/10 RA = 5 RV = 47/7 PA = 42/16 (27) PCW = 6 Fick cardiac output/index = 5.7/3.4 PVR = 4.2 WU SVR 945 Ao sat =   88% PA sat = 71%, 69% High SVC sat = 69%  Assessment: 1. Heavily calcified coronary arteries with only minimal luminal CAD. Widely patent RCA stent 2. LVEF 70% without regional wall motion abnormalities 3. Mild PAH with normal cardiac output on low-dose dopamine 4. No intracardiac shunt 4. PAD - central aortic pressure ~262mG greater than radial artery pressure or cuff pressure   cMRI done to evaluate RV dysfunction - LVEF 77% No RV infarct. Possible infiltrative process but not classic amyloid process. No RV scar  SPEP UPEP negative. (cMRI reviewed with Dr. 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  Tobacco Use   Smoking status: Former    Packs/day: 0.50    Years: 47.00    Pack years: 23.50    Types: Cigarettes    Quit date: 07/20/2016    Years since quitting: 4.2   Smokeless tobacco: Never  Vaping Use   Vaping Use: Never used  Substance and Sexual Activity   Alcohol use: Not Currently  Drug use: No   Sexual activity: Not Currently    Birth control/protection: Surgical    Comment: HYST-1st intercourse 75 yo-More than 5 partners  Other Topics Concern   Not on file  Social History Narrative   Not on file   Social Determinants of Health   Financial Resource Strain: Not on file  Food Insecurity: Not on file  Transportation Needs: Not on file  Physical Activity: Not on file  Stress: Not on file  Social Connections: Not on file  Intimate Partner Violence: Not on file    FH:  Family History  Problem Relation Age of Onset   Heart failure Mother    Stomach cancer Mother    Heart disease Father    Diabetes Father    Colon cancer Sister    Heart disease Brother     Past Medical History:   Diagnosis Date   Cataract    Cervical dysplasia    COPD (chronic obstructive pulmonary disease) ([place])    Coronary artery disease    Hyperlipidemia    MI (myocardial infarction) ([place]) Dec. 2011   stent mid right coronary   Obesity    Tobacco dependence    VAIN (vaginal intraepithelial neoplasia) 2011   Efudex treatment    Current Outpatient Medications  Medication Sig Dispense Refill   albuterol (PROAIR HFA) 108 (90 Base) MCG/ACT inhaler 2 puffs every 4 hours as needed only  if your can't catch your breath 1 Inhaler 1   Ascorbic Acid (VITAMIN C) 100 MG tablet Take 100 mg by mouth daily.     aspirin EC 81 MG tablet Take 81 mg by mouth daily.     b complex vitamins tablet Take 1 tablet by mouth daily.     furosemide (LASIX) 40 MG tablet Take 1 tablet by mouth once daily 30 tablet 6   Ginger 500 MG CAPS Take by mouth.     Honey 5 GM/5ML SYRP Take by mouth.     nitroGLYCERIN (NITROSTAT) 0.4 MG SL tablet DISSOLVE ONE TABLET UNDER THE TONGUE EVERY 5 MINUTES AS NEEDED FOR CHEST PAIN. (Patient not taking: Reported on 08/31/2020) 25 tablet 1   nystatin cream (MYCOSTATIN) APPLY 1 APPLICATION OF CREAM TWICE DAILY 30 g 3   OXYGEN 2lpm with sleep  AHC     potassium chloride SA (KLOR-CON) 20 MEQ tablet Take 1 tablet (20 mEq total) by mouth daily. 30 tablet 11   Vitamin D, Ergocalciferol, (DRISDOL) 1.25 MG (50000 UNIT) CAPS capsule Take 50,000 Units by mouth once a week.     No current facility-administered medications for this encounter.    There were no vitals filed for this visit.  Wt Readings from Last 3 Encounters:  08/31/20 73 kg  10/21/19 73.8 kg  08/22/19 75.8 kg    PHYSICAL EXAM: General:  Obese No resp difficulty HEENT: normal Neck: supple. no JVD. Carotids 2+ bilat; no bruits. No lymphadenopathy or thryomegaly appreciated. Cor: PMI nondisplaced. Regular rate & rhythm. No rubs, gallops or murmurs. Lungs: clear with decreased air movement throguhout Abdomen: obese soft,  nontender, nondistended. No hepatosplenomegaly. No bruits or masses. Good bowel sounds. Extremities: no cyanosis, clubbing, rash, edema Neuro: alert & orientedx3, cranial nerves grossly intact. moves all 4 extremities w/o difficulty. Affect pleasant   Hall walk: O2 sat 93% at rest 87% with hallwalk.   ECG: NSR 91 with occasional PVCs Personally reviewed   ASSESSMENT & PLAN:  1. PAH/RV failure - Echo 11/2016 EF 55-60%, grade 1 DD, RV normal -  Echo 8/20 EF 60-65% RV normal. - Suspect due to COPD and untreated OSA - No evidence of RV infarct or infiltrative process on cMRI 6/18 - Mild PAH on cath 6/18 - Stable NYHA III. Suspect major limitation due to lung disease and not PAH or HF. - Volume status ok  - Continue 40 mg lasix daily - Off spiro due to hyperkalemia. - Continue oxygen.    2. CAD - s/p inferior MI 2011 with RCA stents - cath with non-obstructive CAD in 6/18 (LAD 20%, dLCX 60%, RCA 30%) - No s/s of ischemia - Continue ASA. Unable to tolerate statins. Goal LDL < 70 - Continue zetia.  - If LDL remains > 70 will need referral to lipid clinic for PCSK-9 inhibitor  4. Tobacco use and chronic hypoxic respiratory failure - Quit smoking in July 19, 2016.  - Continue O2. Follows with Dr Melvyn Novas who recently released her to PRN f/u - Suspect she is more out of shape after COVID - Have recommended Pulmonary Rehab but she refuses to get COVID vaccine so unable to participate. I stressed need for vaccine - With exertional desats have strongly suggested wearing O2 with activity  5. HTN - Has white coat syndrome - BP high here but ok at home.    Shary Key, DO  2:55 PM   Patient seen and examined with the above-signed Advanced Practice Provider and/or Housestaff. I personally reviewed laboratory data, imaging studies and relevant notes. I independently examined the patient and formulated the important aspects of the plan. I have edited the note to reflect any of my changes  or salient points. I have personally discussed the plan with the patient and/or family.  Overall doing ok.Can do all ADLs without too much problem as long as she takes her time and wear her O2. (Not wearing here).  Denies CP, edema, orthopnea or PND. Takes BP 3x/day and usually 140/70s. However will spike easily to 170-180s.   Has frequent wheezing. Only uses albuterol inhaler.   Echo today EF 65-60% RV normal Personally reviewed  General:  Well appearing. No resp difficulty HEENT: normal Neck: supple. no JVD. Carotids 2+ bilat; no bruits. No lymphadenopathy or thryomegaly appreciated. Cor: PMI nondisplaced. Regular rate & rhythm. No rubs, gallops or murmurs. Lungs: Decreased throughout. + wheezing  Abdomen: soft, nontender, nondistended. No hepatosplenomegaly. No bruits or masses. Good bowel sounds. Extremities: no cyanosis, clubbing, rash, edema Neuro: alert & orientedx3, cranial nerves grossly intact. moves all 4 extremities w/o difficulty. Affect pleasant  Overall doing ok. Echo today is normal. However BP is elevated with significant white-coat HTN. Will start losartan 50 daily. She is wheezing on exam and will refer back to Pulmonary to see if she can get daily inhaler. Stressed need to be complaint with her O2. No anginal symptoms. Refer to lipid clinic to help with alternatives to statin.   Glori Bickers, MD  3:49 PM

## 2020-10-26 NOTE — Addendum Note (Signed)
Encounter addended by: Scarlette Calico, RN on: 10/26/2020 4:07 PM  Actions taken: Visit diagnoses modified, Order list changed, Diagnosis association updated, Pharmacy for encounter modified, Clinical Note Signed, Charge Capture section accepted

## 2020-10-29 DIAGNOSIS — J9601 Acute respiratory failure with hypoxia: Secondary | ICD-10-CM | POA: Diagnosis not present

## 2020-11-04 DIAGNOSIS — J449 Chronic obstructive pulmonary disease, unspecified: Secondary | ICD-10-CM | POA: Diagnosis not present

## 2020-11-04 DIAGNOSIS — E559 Vitamin D deficiency, unspecified: Secondary | ICD-10-CM | POA: Diagnosis not present

## 2020-11-04 DIAGNOSIS — E7800 Pure hypercholesterolemia, unspecified: Secondary | ICD-10-CM | POA: Diagnosis not present

## 2020-11-04 DIAGNOSIS — I2510 Atherosclerotic heart disease of native coronary artery without angina pectoris: Secondary | ICD-10-CM | POA: Diagnosis not present

## 2020-11-04 DIAGNOSIS — I119 Hypertensive heart disease without heart failure: Secondary | ICD-10-CM | POA: Diagnosis not present

## 2020-11-04 DIAGNOSIS — I509 Heart failure, unspecified: Secondary | ICD-10-CM | POA: Diagnosis not present

## 2020-11-04 DIAGNOSIS — E039 Hypothyroidism, unspecified: Secondary | ICD-10-CM | POA: Diagnosis not present

## 2020-11-17 ENCOUNTER — Other Ambulatory Visit (HOSPITAL_COMMUNITY): Payer: Self-pay | Admitting: Internal Medicine

## 2020-11-18 NOTE — Progress Notes (Signed)
Patient ID: HORTENSE CANTRALL                 DOB: 1945-11-29                    MRN: [id number]     HPI: Deanna Johnston ([name]) is a 75 y.o. female patient referred to lipid clinic by Dr. Haroldine Laws. PMH is significant for CHF, MI, CAD, PAH, COPD, and a history of tobacco dependence.   Patient presents today in good spirits.  Has a history of intolerance to rosuvastatin, atorvastatin, and pravastatin.  Reports had severe myalgia and could not get out of bed. Can not remember her intolerance to ezetimibe.  Of note: was prescribed losartan 50mg  at last HF appointment which she picked up but has not been taking.  Reports after her last hospitalization she enrolled [facility] nutrition classes [facility] which were helpful. She mostly cooks her own food such as chili, vegetables, salads, and proteins.  Was enrolled in exercise classes as well which she enjoyed until they were stopped for the pandemic.  When it was restarted she said she stopped because could not reach the pedals on her assigned exercise bike.  Admits to occasionally eating [employer] french fries and hamburgers.  Lives alone. Sister is a cause of stress who she supports financially and also provides food for.    Has a friend who is a Environmental consultant who told her the best way to reduce her cholesterol was starting magnesium malate.    Is very worried about having adverse reactions to medications. Does not believe her lipid panel last month was accurate because she was not fasting.  Current Medications: n/a Intolerances: Atorvastatin (myalgia), Rosuvastatin, Niaspan (nausea), Zetia, pravastatin Risk Factors: CHF, CAD, hx of NSTEMI, HTN  Labs: TC 201, Trigs 165, HDL 64, LDL 104 - 10/26/20 (not on lipid lowering therapy)  Past Medical History:  Diagnosis Date   Cataract    Cervical dysplasia    COPD (chronic obstructive pulmonary disease) ([place])    Coronary artery disease    Hyperlipidemia    MI (myocardial infarction) ([place]) Dec.  2011   stent mid right coronary   Obesity    Tobacco dependence    VAIN (vaginal intraepithelial neoplasia) 2011   Efudex treatment    Current Outpatient Medications on File Prior to Visit  Medication Sig Dispense Refill   albuterol (PROAIR HFA) 108 (90 Base) MCG/ACT inhaler 2 puffs every 4 hours as needed only  if your can't catch your breath 1 Inhaler 1   Ascorbic Acid (VITAMIN C) 100 MG tablet Take 100 mg by mouth daily.     aspirin EC 81 MG tablet Take 81 mg by mouth daily.     b complex vitamins tablet Take 1 tablet by mouth daily.     furosemide (LASIX) 40 MG tablet Take 1 tablet by mouth once daily 30 tablet 6   Ginger 500 MG CAPS Take by mouth.     Honey 5 GM/5ML SYRP Take by mouth.     losartan (COZAAR) 50 MG tablet Take 1 tablet (50 mg total) by mouth daily. 90 tablet 3   nitroGLYCERIN (NITROSTAT) 0.4 MG SL tablet DISSOLVE ONE TABLET UNDER THE TONGUE EVERY 5 MINUTES AS NEEDED FOR CHEST PAIN. 25 tablet 1   NP THYROID 60 MG tablet Take 60 mg by mouth daily.     nystatin cream (MYCOSTATIN) Apply 1 application topically as needed for dry skin.  OXYGEN 2lpm with sleep  AHC     potassium chloride SA (KLOR-CON) 20 MEQ tablet Take 1 tablet by mouth once daily 30 tablet 11   Vitamin D, Ergocalciferol, (DRISDOL) 1.25 MG (50000 UNIT) CAPS capsule Take 50,000 Units by mouth once a week.     No current facility-administered medications on file prior to visit.    Allergies  Allergen Reactions   Lipitor [Atorvastatin]     myalgias   Crestor [Rosuvastatin Calcium]     intolerant   Prednisone     "just makes me sick"   Sulfa Antibiotics Swelling   Synthroid [Levothyroxine Sodium]     Pt unsure   Niaspan [Niacin] Nausea Only    nausea    Assessment/Plan:  1. Hyperlipidemia - Patient's LDL 104 which is above goal of <70.  Patient extremely hesitant to start new medications.  Counseled patient on benefits of LDL reduction especially with her comorbidities.  Understood that  last lipid panel was not fasting but showed that LDL level was consistent with previous results.  Advised she was high risk for another MI.  Also strongly advised she start her losartan she picked up at pharmacy due to elevated BP.  Using [employer], educated patient on mechanism of action, storage, site selection, administration, and possible adverse effects.  Patient was able to demonstrate in room.  Patient still hesitant.  Explained pathophysiology of plaque formation and consequences of untreated cholesterol.  Patient would like to try magnesium malate first.  Advised that it has not been proven to lower cholesterol or reduce risk of MI and Repatha has been proven  However she believes she will not have side effects from magnesium.  Compromised with patient.  Patient will try magnesium supplement for 2 months and then repeat lipid panel. If still above goal, will start [place].  Patient was agreeable.  Order for lipid panel placed for mid December.  Check lipid panel in 2 months Follow up in 2 months   Karren Cobble, PharmD, BCACP, [place], [place] [address], [place], [place] [address] Phone: [phone]; Fax: [phone] 11/19/2020 5:07 PM

## 2020-11-19 ENCOUNTER — Ambulatory Visit: Payer: Medicare Other | Admitting: Pharmacist

## 2020-11-19 ENCOUNTER — Other Ambulatory Visit: Payer: Self-pay

## 2020-11-19 VITALS — BP 184/78 | HR 72 | Resp 94 | Wt 162.8 lb

## 2020-11-19 DIAGNOSIS — M791 Myalgia, unspecified site: Secondary | ICD-10-CM

## 2020-11-19 DIAGNOSIS — I2510 Atherosclerotic heart disease of native coronary artery without angina pectoris: Secondary | ICD-10-CM | POA: Diagnosis not present

## 2020-11-19 DIAGNOSIS — T466X5A Adverse effect of antihyperlipidemic and antiarteriosclerotic drugs, initial encounter: Secondary | ICD-10-CM

## 2020-11-19 NOTE — Patient Instructions (Addendum)
It was very nice meeting you!  We would like your LDL (bad cholesterol) to be less than 70  You can begin your magnesium supplement now and we will recheck your cholesterol in December  If still elevated, then we will start the Repatha every 2 weeks.  We can start with a sample.    If you tolerate it, we will recheck your cholesterol again in 2-3 months to see if it worked  Continue your healthy eating and try to limit your salt and fatty foods  I recommend you start your losartan 50mg  once a day  Please call with any questions  Karren Cobble, PharmD, BCACP, [place], [place]. [address] [place], [place] [address] Phone: [phone]; Fax: [phone] 11/19/2020 11:22 AM

## 2020-11-28 DIAGNOSIS — J9601 Acute respiratory failure with hypoxia: Secondary | ICD-10-CM | POA: Diagnosis not present

## 2020-12-23 ENCOUNTER — Other Ambulatory Visit: Payer: Self-pay | Admitting: Family Medicine

## 2020-12-23 DIAGNOSIS — Z1231 Encounter for screening mammogram for malignant neoplasm of breast: Secondary | ICD-10-CM

## 2021-02-02 ENCOUNTER — Ambulatory Visit
Admission: RE | Admit: 2021-02-02 | Discharge: 2021-02-02 | Disposition: A | Discharge status: Home / Self Care | Payer: Medicare Other | Source: Ambulatory Visit | Attending: Family Medicine | Admitting: Family Medicine

## 2021-02-02 DIAGNOSIS — Z1231 Encounter for screening mammogram for malignant neoplasm of breast: Secondary | ICD-10-CM

## 2021-02-03 ENCOUNTER — Other Ambulatory Visit: Payer: Self-pay | Admitting: Family Medicine

## 2021-02-03 DIAGNOSIS — R928 Other abnormal and inconclusive findings on diagnostic imaging of breast: Secondary | ICD-10-CM

## 2021-02-24 ENCOUNTER — Ambulatory Visit
Admission: RE | Admit: 2021-02-24 | Discharge: 2021-02-24 | Disposition: A | Discharge status: Home / Self Care | Payer: Medicare Other | Source: Ambulatory Visit | Attending: Family Medicine | Admitting: Family Medicine

## 2021-02-24 ENCOUNTER — Other Ambulatory Visit: Payer: Self-pay | Admitting: Family Medicine

## 2021-02-24 DIAGNOSIS — R928 Other abnormal and inconclusive findings on diagnostic imaging of breast: Secondary | ICD-10-CM

## 2021-02-24 DIAGNOSIS — R922 Inconclusive mammogram: Secondary | ICD-10-CM | POA: Diagnosis not present

## 2021-02-24 DIAGNOSIS — N6320 Unspecified lump in the left breast, unspecified quadrant: Secondary | ICD-10-CM

## 2021-02-25 ENCOUNTER — Ambulatory Visit
Admission: RE | Admit: 2021-02-25 | Discharge: 2021-02-25 | Disposition: A | Discharge status: Home / Self Care | Payer: Medicare Other | Source: Ambulatory Visit | Attending: Family Medicine | Admitting: Family Medicine

## 2021-02-25 DIAGNOSIS — N6321 Unspecified lump in the left breast, upper outer quadrant: Secondary | ICD-10-CM | POA: Diagnosis not present

## 2021-02-25 DIAGNOSIS — N6320 Unspecified lump in the left breast, unspecified quadrant: Secondary | ICD-10-CM

## 2021-02-25 DIAGNOSIS — C50212 Malignant neoplasm of upper-inner quadrant of left female breast: Secondary | ICD-10-CM | POA: Diagnosis not present

## 2021-02-25 DIAGNOSIS — N6322 Unspecified lump in the left breast, upper inner quadrant: Secondary | ICD-10-CM | POA: Diagnosis not present

## 2021-02-25 DIAGNOSIS — N6032 Fibrosclerosis of left breast: Secondary | ICD-10-CM | POA: Diagnosis not present

## 2021-03-01 ENCOUNTER — Other Ambulatory Visit: Payer: Self-pay

## 2021-03-01 ENCOUNTER — Ambulatory Visit: Payer: Medicare Other | Admitting: Internal Medicine

## 2021-03-01 ENCOUNTER — Encounter: Payer: Self-pay | Admitting: Internal Medicine

## 2021-03-01 DIAGNOSIS — J449 Chronic obstructive pulmonary disease, unspecified: Secondary | ICD-10-CM

## 2021-03-01 DIAGNOSIS — J9611 Chronic respiratory failure with hypoxia: Secondary | ICD-10-CM

## 2021-03-01 NOTE — Patient Instructions (Addendum)
02 2lpm bedtime  Make sure you check your oxygen saturation  AT  your highest level of activity (not after you stop)   to be sure it stays over 90% and adjust  02 flow upward to maintain this level if needed but remember to turn it back to previous settings when you stop (to conserve your supply).   Only use your albuterol as a rescue medication to be used if you can't catch your breath by resting or doing a relaxed purse lip breathing pattern.  - The less you use it, the better it will work when you need it. - Ok to use up to 2 puffs  every 4 hours if you must but call for immediate appointment if use goes up over your usual need - Don't leave home without it !!  (think of it like the spare tire for your car)   Ok to try albuterol 15 min before an activity (on alternating days like starter fluid)  that you know would usually make you short of breath and see if it makes any difference and if makes none then don't take albuterol after activity unless you can't catch your breath as this means it's the resting that helps, not the albuterol.       If you are satisfied with your treatment plan,  let your doctor know and he/she can either refill your medications or you can return here when your prescription runs out.     If in any way you are not 100% satisfied,  please tell us.  If 100% better, tell your friends!  Pulmonary follow up is as needed

## 2021-03-01 NOTE — Progress Notes (Signed)
Subjective:   Patient ID: Deanna Johnston, female    DOB: 01/21/1946   MRN: [id number]    Brief patient profile:  53    yowf quit smoking 07/2016  with tendency to rhinitis/cough x adulthood better since around 2000 on one tsp honey per day then cough started early Dec 2014 and persisted so referred 03/11/2013 by Lady Deutscher proved to have probable acei cough and unrelated  COPD GOLD II documented 04/14/2013     History of Present Illness    03/11/2013 1st [facility] office visit/ Deanna Johnston on ACEi cc persistent cough x 8 weeks she attributes to getting the pneumonia booster more day > night mostly  non-productive  And assoc with sense of pnds / throat tickle.  Does not wake her from sleep and not aware of it until after stirs in am. Already tried abx and inhalers not helping rec The key is to stop smoking completely before smoking completely stops you - this is the most important aspect of your care Stop lisinopril one daily  Start diovan 80 mg one daily      04/14/2013 f/u ov/Deanna Johnston re: GOLD II COPD/ still smoking Cc cough now mostly at hs now whereas was more all day before/ somewhat rattling features, but no excess or purulent sputum production. Not limited by breathing from desired activities  - no need for saba   rec You have GOLD II COPD, the only way to keep it from progressing is to stop smoking now. If the cough starts bothering you again: Pantoprazole (protonix) 40 mg   Take 30-60 min before first meal of the day and Pepcid 20 mg one bedtime until return to office - this is the best way to tell whether stomach acid is contributing to your problem.   GERD diet Please schedule a follow up office visit in 4 weeks, sooner if needed > did not return as rec    Admit date: 07/20/2016 Discharge date: 07/29/2016   Discharge Diagnoses:  Principal Problem:   Acute respiratory failure with hypoxia and hypercapnia (HCC) Active Problems:   Acute on chronic heart failure (HCC)   Cardiogenic  shock (HCC)   Tobacco dependence   Hyperlipidemia   Essential hypertension, benign   Pressure injury of skin   Hypotension   Right ventricular failure (HCC)   PAH (pulmonary artery hypertension) ([place])   Hypoxia     History of present illness:  Per Dr. Corrie Dandy 76 year old female with PMH of COPD (Active smoker, 40-50 year 2 ppd, reports cut back to 1/2 ppd), CAD s/p stents in 2011, HLD, MI. Reports at baseline is SOB with chronic cough.    Presents to ED on 6/7 with progressive dyspnea for the last 2 weeks with increased swelling to BLE. Upon arrival to ED BNP 1113.9, WBC 11.6, Lactic 1.45, ABG 7.3/60/63. Hypotensive with systolic 78-29, oxygenation 90s on 4L [place], received 500 ml bolus and Rocephin/Azithromycin. Cardiology was consulted who recommended diuresis. PCCM asked to admit.    Hospital Course:  #1 acute on chronic hypoxic/hypercarbic respiratory failure Concern for possible acute exacerbation of COPD and acute on chronic right ventricular heart failure. Patient was on the critical care service initially required BiPAP and subsequently weaned down to high flow nasal 6 L. Patient with clinical improvement. It was felt by critical care medicine the patient was likely hypoxemic for years and will likely need home O2 upon discharge. BiPAP was subsequently discontinued. Patient will was also diuresed with IV diuretics  and transitioned to oral Lasix per cardiology. Patient was -10.8 L during this hospitalization. Patient status post antibiotics. Patient also placed on DuoNeb's per pulmonary recommendations and will be discharged on DuoNeb's. Patient improved clinically. Oxygen on room air and ambulation with checked and patient was noted to have sats of 85% on room air. Patient be discharged home on oxygen 3 L nasal cannula. Patient is to follow-up with pulmonary and cardiology in the outpatient setting.   #2 shock secondary to right ventricular failure likely secondary to cor pulmonale and  obstructive sleep apnea 2-D echo 07/21/2016 with a EF of 65-70% with grade 1 diastolic dysfunction, moderate to severely reduced right ventricular systolic function. Dopamine which was initially started due to shock was subsequently discontinued as patient's blood pressure improved. Patient status post right and left heart catheterization 07/26/2016 with EF of 65%, 60% distal circumflex lesion, 20% proximal LAD to mid LAD lesion, 30% distal RCA lesion, 20% stenosed mid RCA lesion. Patient subsequently underwent cardiac MRI. Is felt per pulmonary and cardiology that patient would likely need home O2 and outpatient sleep study. Patient's blood pressure stabilized after dopamine was discontinued. Patient's antihypertensive medications were held due to borderline blood pressure was not resumed on discharge. Patient was discharged on oral Lasix 40 mg daily and is to follow-up with cardiology and pulmonary outpatient setting. Patient will be set up by pulmonary for sleep study.    #3 hyperlipidemia Patient was placed on a statin during the hospitalization be discharged on a statin.   #4 probable obstructive sleep apnea Patient will need outpatient sleep study. Pulmonary to set up as an outpatient.   #6 acute kidney injury Resolved.   #7 coronary artery disease Stable. Cardiac medications of metoprolol and ARB were held due to borderline blood pressure and not resumed on discharge. Patient will follow-up with cardiology in the outpatient setting.       NP ov  08/17/16  Begin Anoro 1 puff daily . Rinse after use> stopped after one week   [place] job not smoking . Keep up good work . [employer]  Decrease Oxygen 2l/m .  Order for POC oxygen device.    09/25/2016  Extended f/u ov/Deanna Johnston re: re-establish for copd management after failing to f/u in 04/2013 as above  Chief Complaint  Patient presents with   Follow-up    review pft.  pt states she is doing well, denies any current breathing complaints.    thoroughly  confused between maint and prn, not clear did better on anoro as never changed saba to prn  Doe = MMRC2 = can't walk a nl pace on a flat grade s sob but does fine slow and flat eg [employer] shopping on 2lpm does not titrate (has meter, doesn't use with activity) rec When you walk keep saturations above 90%  - same goal when sitting. Plan A = Automatic =  Anoro one click each am  - 2 good drags  Plan B = Backup Only use your albuterol as a rescue medication  Please see patient coordinator before you leave today  to schedule ambulatory 02 concentrator eligbility        12/26/2016  f/u ov/Deanna Johnston re:  Copd GOLD   II / maint on Anoro/ no need for neb / using 02 2lpm hs and prn daytime Chief Complaint  Patient presents with   Follow-up    Breathing is doing well. She does not have a rescue inhaler and requests that we prescribe one.   doe x  MMRC2 = can't walk a nl pace on a flat grade s sob but does fine slow and flat eg shopping on 02 No need for any saba at all and want to get rid of neb rec Goal is to keeps sats over 90% at all times so keep on the 2lpm at bedtime and adjust during the day to keep over 90% depending on your level of activity. Plan A = Automatic = Anoro one click each am or nebulizer up to 4 x daily to use it up  Plan B = Backup Only use your albuterol (Proair) as a rescue medication    06/25/2017  f/u ov/Deanna Johnston re:   GOLD II/ copd 02 hs and prn daytime  Chief Complaint  Patient presents with   Follow-up    Breathing is overall doing well. She rarely has to use her albuterol inhaler.    Dyspnea:  MMRC2 = can't walk a nl pace on a flat grade s sob but does fine slow and flat on 2lpm NP pushing grocery cart anywhere she wants to go  Cough: no Sleep: ok 2lpm horizontal SABA use: none At rest usually sats 95% RA  rec Goal is to keep the 02 saturation above 90%  Work on inhaler technique:   Plan B = Backup Only use your albuterol (Proair)   07/09/2018  f/u ov/Deanna Johnston re:  copd gold II on prn 02 daytime and maint on anoro daily  Chief Complaint  Patient presents with   Follow-up    Breathing is unchanged. She has only been using o2 with sleep. She has not had to use her albuterol inhaler.    Dyspnea:  MMRC2 = can't walk a nl pace on a flat grade s sob but does fine slow and flat  - pushing s 02 or 02 monitor Cough: none Sleeping: on side one pillow flat bed  SABA use: rare 02: 2lpm hs and prn daytime (never but never checks)  Exac: none since last ov  Rec No change in your medications  Work on proair inhaler technique:   Continue anoro one click each am  Only use your albuterol (proair)  as a rescue medication   03/01/2021  f/u ov/Deanna Johnston re: COPD GOLD 2 /    maint on 02 hs and prn daytime - not using saba or anoro as rec "doesn't make a difference" Chief Complaint  Patient presents with   Follow-up    Breathing is overall doing well. She states using o2 2lpm with sleep.   Dyspnea:  chair ex / less since covid  Cough: none  Sleeping: flat bed one pillow  SABA use: none /now  02: 2 hs and prn daytime Covid status:   x one JJ    No obvious day to day or daytime variability or assoc excess/ purulent sputum or mucus plugs or hemoptysis or cp or chest tightness, subjective wheeze or overt sinus or hb symptoms.   Sleeping  without nocturnal  or early am exacerbation  of respiratory  c/o's or need for noct saba. Also denies any obvious fluctuation of symptoms with weather or environmental changes or other aggravating or alleviating factors except as outlined above   No unusual exposure hx or h/o childhood pna/ asthma or knowledge of premature birth.  Current Allergies, Complete Past Medical History, Past Surgical History, Family History, and Social History were reviewed in [employer] record.  ROS  The following are not active complaints unless bolded Hoarseness, sore throat, dysphagia, dental  problems, itching, sneezing,  nasal  congestion or discharge of excess mucus or purulent secretions, ear ache,   fever, chills, sweats, unintended wt loss or wt gain, classically pleuritic or exertional cp,  orthopnea pnd or arm/hand swelling  or leg swelling, presyncope, palpitations, abdominal pain, anorexia, nausea, vomiting, diarrhea  or change in bowel habits or change in bladder habits, change in stools or change in urine, dysuria, hematuria,  rash, arthralgias, visual complaints, headache, numbness, weakness or ataxia or problems with walking or coordination,  change in mood or  memory.        Current Meds  Medication Sig   albuterol (PROAIR HFA) 108 (90 Base) MCG/ACT inhaler 2 puffs every 4 hours as needed only  if your can't catch your breath   Ascorbic Acid (VITAMIN C) 100 MG tablet Take 100 mg by mouth daily.   aspirin EC 81 MG tablet Take 81 mg by mouth daily.   b complex vitamins tablet Take 1 tablet by mouth daily.   furosemide (LASIX) 40 MG tablet Take 1 tablet by mouth once daily   Ginger 500 MG CAPS Take by mouth.   Honey 5 GM/5ML SYRP Take by mouth.   losartan (COZAAR) 50 MG tablet Take 1 tablet (50 mg total) by mouth daily.   nitroGLYCERIN (NITROSTAT) 0.4 MG SL tablet DISSOLVE ONE TABLET UNDER THE TONGUE EVERY 5 MINUTES AS NEEDED FOR CHEST PAIN.   NP THYROID 60 MG tablet Take 60 mg by mouth daily.   nystatin cream (MYCOSTATIN) Apply 1 application topically as needed for dry skin.   OXYGEN 2lpm with sleep  AHC   potassium chloride SA (KLOR-CON) 20 MEQ tablet Take 1 tablet by mouth once daily                  Objective:   Physical Exam  Wts   03/01/2021   159  07/09/2018   158 04/14/2013     153 > 09/25/2016   155 > 12/26/2016     160 >  06/25/2017  160 >      03/11/13 153 lb 12.8 oz (69.763 kg)  09/04/12 159 lb (72.122 kg)  08/02/12 158 lb (71.668 kg)   Vital signs reviewed  03/01/2021  - Note at rest 02 sats  94% on RA   General appearance:    amb wf on rollator    HEENT : pt wearing mask not  removed for exam due to covid - 19 concerns.    NECK :  without JVD/Nodes/TM/ nl carotid upstrokes bilaterally   LUNGS: no acc muscle use,  Mild barrel  contour chest wall with bilateral  Distant bs s audible wheeze and  without cough on insp or exp maneuvers  and mild  Hyperresonant  to  percussion bilaterally     CV:  RRR  no s3 or murmur or increase in P2, and no edema   ABD:  soft and nontender with pos end  insp Hoover's  in the supine position. No bruits or organomegaly appreciated, bowel sounds nl  MS:   Nl gait/  ext warm without deformities, calf tenderness, cyanosis or clubbing No obvious joint restrictions   SKIN: warm and dry without lesions    NEURO:  alert, approp, nl sensorium with  no motor or cerebellar deficits apparent.               Assessment & Plan:

## 2021-03-02 ENCOUNTER — Telehealth: Payer: Self-pay | Admitting: Hematology and Oncology

## 2021-03-02 ENCOUNTER — Encounter: Payer: Self-pay | Admitting: Internal Medicine

## 2021-03-02 NOTE — Telephone Encounter (Signed)
Spoke to patient to confirm afternoon clinic appointment for 1/25, packet sent via e-mail

## 2021-03-02 NOTE — Assessment & Plan Note (Signed)
HC03  09/21/16  =  29 (improved from peak of 36 one m prior)  -  09/25/2016   Room  Air at Rest = 88% Patient Saturations on [place] while Ambulating = 87% Patient Saturations on 2L Liters of oxygen while Ambulating = 93%   Again adviese 2lpm hs and "Make sure you check your oxygen saturation  AT  your highest level of activity (not after you stop)   to be sure it stays over 90% and adjust  02 flow upward to maintain this level if needed but remember to turn it back to previous settings when you stop (to conserve your supply)"   Pulmonary f/u can be prn recent or worsening sats.           Each maintenance medication was reviewed in detail including emphasizing most importantly the difference between maintenance and prns and under what circumstances the prns are to be triggered using an action plan format where appropriate.  Total time for H and P, chart review, counseling, reviewing hfa/dpi/02 device(s) and generating customized AVS unique to this summary office visit / same day charting > 30 min

## 2021-03-02 NOTE — Assessment & Plan Note (Signed)
Quit smoking 07/2016  - PFTs 04/14/2013  FEV1  1.03 (59%) ratio 69 and no better with dlco 66% - PFT's  09/25/2016  FEV1 1.16  (71 % ) ratio 64  p no % improvement from saba p neb w/in 4 h  prior to study with DLCO  58/52  % corrects to 62 % for alv volume   - 09/25/2016  After extensive coaching  Device  effectiveness =    90% vs 0 % baseline (blew out first) on elipta device so rechallenge with anoro each am  - 06/25/2017  After extensive coaching inhaler device  effectiveness =    75% (TI short)    Pt is Group B in terms of symptom/risk and laba/lama therefore appropriate rx at this point >>>  anoro can be used here "like high octane fuel" but she has to understand she can't just take a puff now and then  saba can be used like starter fluid but she has to take it before ex, not afterwards  If neither makes a difference used correctly then she can just keep saba on hand for aecopd and f/u here prn.

## 2021-03-07 ENCOUNTER — Other Ambulatory Visit: Payer: Self-pay

## 2021-03-07 ENCOUNTER — Encounter: Payer: Self-pay | Admitting: *Deleted

## 2021-03-07 ENCOUNTER — Ambulatory Visit (INDEPENDENT_AMBULATORY_CARE_PROVIDER_SITE_OTHER): Payer: Medicare Other | Admitting: Physician Assistant

## 2021-03-07 ENCOUNTER — Ambulatory Visit: Payer: Self-pay

## 2021-03-07 ENCOUNTER — Encounter: Payer: Self-pay | Admitting: Physician Assistant

## 2021-03-07 DIAGNOSIS — M25521 Pain in right elbow: Secondary | ICD-10-CM

## 2021-03-07 NOTE — Progress Notes (Signed)
HPI: Deanna Johnston 76 year old female whose been seen in the past for other issues.  He has been extensively 2019 when we last saw her.  She comes in today with right elbow pain.  She hit her right elbow against a door 2 to 3 weeks ago and has had some pain in her elbow since then.  She does report she has a history of left elbow olecranon bursitis.  Had to have this drained a few years ago.  She has had no other injuries.  Review of systems see HPI otherwise negative noncontributory.  Physical exam: General well-developed well-nourished female who walks with a Rollator and a non-antalgic gait. Psych: Alert and oriented x3.  Physical exam: Bilateral elbow is excellent range of motion without significant pain.  She has full supination pronation of the forearms.  Nontender over the medial lateral aspect condyle region of both elbows.  She has slight tenderness over the olecranon region of the right elbow only.  Dry skin patches extensor aspect of both elbows.  No impending ulcers or signs of infection either elbow.  Radiographs: Right elbow 3 views: Elbow is well located.  No acute fractures.  No bony abnormalities.  Elbow joints well-maintained.  Impression: Right elbow pain  Plan: Recommend activities as tolerated.  Discussed with her she could have a bony contusion of the elbow.  At this point time I see no signs of olecranon bursitis or anything that needs to be drained.  Reassured her that x-ray showed no acute fractures or acute findings and that her elbow joint is well-preserved.  Questions were encouraged and answered.  Follow-up as pain persist or becomes worse.

## 2021-03-08 ENCOUNTER — Encounter: Payer: Self-pay | Admitting: *Deleted

## 2021-03-08 ENCOUNTER — Other Ambulatory Visit: Payer: Self-pay | Admitting: *Deleted

## 2021-03-08 DIAGNOSIS — C50212 Malignant neoplasm of upper-inner quadrant of left female breast: Secondary | ICD-10-CM

## 2021-03-08 NOTE — Progress Notes (Signed)
Radiation Oncology         [phone] ________________________________  Multidisciplinary Breast Oncology Clinic [facility]) Initial Outpatient Consultation  Name: Deanna Johnston MRN: [id number]  Date: 03/09/2021  DOB: May 10, 1945  [id number], Margaretha Sheffield, MD  Coralie Keens, MD   REFERRING PHYSICIAN: Coralie Keens, MD  DIAGNOSIS: The encounter diagnosis was Malignant neoplasm of upper-inner quadrant of left breast in female, estrogen receptor positive ([place]).  Stage IA (cT1c, cN0, cM0) Left Breast UIQ, Invasive Ductal Carcinoma, Grade 2; ER+, PR+, Ki67-15%, Her2Neu-equivocal   ICD-10-CM   1. Malignant neoplasm of upper-inner quadrant of left breast in female, estrogen receptor positive ([place])  C50.212    Z17.0       HISTORY OF PRESENT ILLNESS::Deanna Johnston is a 76 y.o. female who is presenting to the office today for evaluation of her newly diagnosed breast cancer. She is accompanied by her niece. She is doing well overall.   She had routine screening mammography on 02/02/21 showing a possible abnormality in the left breast. She underwent unilateral left diagnostic mammography with tomography and left breast ultrasonography at [facility] on 02/24/21 showing: a highly suspicious left breast mass located at 11:30 o'clock position, 2 cmfn, measuring 1.7 x 1.4 x 1.7 cm; an indeterminate adjacent lesion/mass measuring 4 x 4 x 4 mm at the 12 o'clock position, 2 cmfn; and a simple cyst in the lateral left breast. No axillary adenopathy was appreciated.   Left breast 11:30 o'clock biopsy on 02/25/21 showed: grade 2 invasive ductal carcinoma measuring 0.6 cm in the maximal extent. 12 o'clock left breast biopsy showed fibrosis and usual ductal hyperplasia and no evidence of malignancy.   Menarche: 5-8 years old  LMP: hysterectomy at 76 years old  Contraceptive: never used HRT: yes, for 10 years, stopped using when she was 89   The patient was referred today for presentation in the  multidisciplinary conference.  Radiology studies and pathology slides were presented there for review and discussion of treatment options.  A consensus was discussed regarding potential next steps.  PREVIOUS RADIATION THERAPY: No  PAST MEDICAL HISTORY:  Past Medical History:  Diagnosis Date   Cataract    Cervical dysplasia    COPD (chronic obstructive pulmonary disease) ([place])    Coronary artery disease    Hyperlipidemia    Hypertension    MI (myocardial infarction) ([place]) 01/2010   stent mid right coronary   Obesity    Tobacco dependence    VAIN (vaginal intraepithelial neoplasia) 2011   Efudex treatment    PAST SURGICAL HISTORY: Past Surgical History:  Procedure Laterality Date   ABDOMINAL HYSTERECTOMY     TAH BSO   anal fistula repair     CARDIAC CATHETERIZATION  2011 Dec.   stent mid right coronary,Primus element stent   CATARACT EXTRACTION     COLPOSCOPY     OOPHORECTOMY     BSO   OTHER SURGICAL HISTORY     hysterectomy   RIGHT/LEFT HEART CATH AND CORONARY ANGIOGRAPHY N/A 07/26/2016   Procedure: Right/Left Heart Cath and Coronary Angiography;  Surgeon: Jolaine Artist, MD;  Location: [facility];  Service: Cardiovascular;  Laterality: N/A;    FAMILY HISTORY:  Family History  Problem Relation Age of Onset   Heart failure Mother    Stomach cancer Mother    Heart disease Father    Diabetes Father    Colon cancer Sister    Heart disease Brother     SOCIAL HISTORY:  Social History  Socioeconomic History   Marital status: Single    Spouse name: Not on file   Number of children: Not on file   Years of education: Not on file   Highest education level: Not on file  Occupational History   Occupation: Retired    Fish farm manager: RETIRED  Tobacco Use   Smoking status: Former    Packs/day: 0.50    Years: 47.00    Pack years: 23.50    Types: Cigarettes    Quit date: 07/20/2016    Years since quitting: 4.6   Smokeless tobacco: Never  Vaping Use   Vaping  Use: Never used  Substance and Sexual Activity   Alcohol use: Not Currently   Drug use: No   Sexual activity: Not Currently    Birth control/protection: Surgical    Comment: HYST-1st intercourse 76 yo-More than 5 partners  Other Topics Concern   Not on file  Social History Narrative   Not on file   Social Determinants of Health   Financial Resource Strain: Not on file  Food Insecurity: Not on file  Transportation Needs: Not on file  Physical Activity: Not on file  Stress: Not on file  Social Connections: Not on file    ALLERGIES:  Allergies  Allergen Reactions   Lipitor [Atorvastatin]     myalgias   Crestor [Rosuvastatin Calcium]     intolerant   Prednisone     "just makes me sick"   Sulfa Antibiotics Swelling   Synthroid [Levothyroxine Sodium]     Pt unsure   Niaspan [Niacin] Nausea Only    nausea    MEDICATIONS:  Current Outpatient Medications  Medication Sig Dispense Refill   albuterol (PROAIR HFA) 108 (90 Base) MCG/ACT inhaler 2 puffs every 4 hours as needed only  if your can't catch your breath 1 Inhaler 1   Ascorbic Acid (VITAMIN C) 100 MG tablet Take 100 mg by mouth daily.     aspirin EC 81 MG tablet Take 81 mg by mouth daily.     b complex vitamins tablet Take 1 tablet by mouth daily.     furosemide (LASIX) 40 MG tablet Take 1 tablet by mouth once daily 30 tablet 6   Ginger 500 MG CAPS Take by mouth.     Honey 5 GM/5ML SYRP Take by mouth.     losartan (COZAAR) 50 MG tablet Take 1 tablet (50 mg total) by mouth daily. 90 tablet 3   nitroGLYCERIN (NITROSTAT) 0.4 MG SL tablet DISSOLVE ONE TABLET UNDER THE TONGUE EVERY 5 MINUTES AS NEEDED FOR CHEST PAIN. 25 tablet 1   NP THYROID 60 MG tablet Take 60 mg by mouth daily.     nystatin cream (MYCOSTATIN) Apply 1 application topically as needed for dry skin.     OXYGEN 2lpm with sleep  AHC     potassium chloride SA (KLOR-CON) 20 MEQ tablet Take 1 tablet by mouth once daily 30 tablet 11   No current  facility-administered medications for this encounter.    REVIEW OF SYSTEMS: A 10+ POINT REVIEW OF SYSTEMS WAS OBTAINED including neurology, dermatology, psychiatry, cardiac, respiratory, lymph, extremities, GI, GU, musculoskeletal, constitutional, reproductive, HEENT. On the provided form, she reports lower back pain due to scoliosis, wearing hearing aids, runny nose, shortness of breath walking up stairs, sleeping with 1 pillow, palpable breast lump correlating with breast cancer, and thyroid problem. She denies any other symptoms.    PHYSICAL EXAM:    Vitals with BMI 03/09/2021  Height _0   Weight 159 lbs 13 oz  BMI 44.92  Systolic 010  Diastolic 65  Pulse 86    Lungs are clear to auscultation bilaterally. Heart has regular rate and rhythm. No palpable cervical, supraclavicular, or axillary adenopathy. Abdomen soft, non-tender, normal bowel sounds. Breast: Right breast with no palpable mass, nipple discharge, or bleeding. Left breast with some bruising in the upper aspect of the breast and some biopsy induration in this area. Patient uses a rolling walker.   KPS = 70  100 - Normal; no complaints; no evidence of disease. 90   - Able to carry on normal activity; minor signs or symptoms of disease. 80   - Normal activity with effort; some signs or symptoms of disease. 72   - Cares for self; unable to carry on normal activity or to do active work. 60   - Requires occasional assistance, but is able to care for most of her personal needs. 50   - Requires considerable assistance and frequent medical care. 58   - Disabled; requires special care and assistance. 73   - Severely disabled; hospital admission is indicated although death not imminent. 68   - Very sick; hospital admission necessary; active supportive treatment necessary. 10   - Moribund; fatal processes progressing rapidly. 0     - Dead  Karnofsky DA, Abelmann Palmer, Craver LS and Burchenal [facility] [phone]) The use of the nitrogen  mustards in the palliative treatment of carcinoma: with particular reference to bronchogenic carcinoma Cancer 1 634-56  LABORATORY DATA:  Lab Results  Component Value Date   WBC 8.5 03/09/2021   HGB 15.8 (H) 03/09/2021   HCT 49.8 (H) 03/09/2021   MCV 85.9 03/09/2021   PLT 243 03/09/2021   Lab Results  Component Value Date   NA 140 03/09/2021   K 3.9 03/09/2021   CL 105 03/09/2021   CO2 30 03/09/2021   Lab Results  Component Value Date   ALT 14 03/09/2021   AST 17 03/09/2021   ALKPHOS 90 03/09/2021   BILITOT 0.6 03/09/2021    PULMONARY FUNCTION TEST:   Recent Review Flowsheet Data   There is no flowsheet data to display.     RADIOGRAPHY: US BREAST LTD UNI LEFT INC AXILLA  Result Date: 02/24/2021 CLINICAL DATA:  The patient was called back for 2 left breast masses. EXAM: DIGITAL DIAGNOSTIC UNILATERAL LEFT MAMMOGRAM WITH TOMOSYNTHESIS AND CAD; ULTRASOUND LEFT BREAST LIMITED TECHNIQUE: Left digital diagnostic mammography and breast tomosynthesis was performed. The images were evaluated with computer-aided detection.; Targeted ultrasound examination of the left breast was performed. COMPARISON:  Previous exam(s). ACR Breast Density Category b: There are scattered areas of fibroglandular density. FINDINGS: There is a spiculated mass at 12 o'clock in the left breast at a mid depth. There is an enlarging mass in the lateral posterior left breast. No other suspicious findings. Targeted ultrasound is performed, showing a suspicious mass in the left breast at 11:30, 2 cm from the nipple measuring 1.7 x 1.4 by 1.7 cm. There is an adjacent irregular mass at 12 o'clock, 2 cm from the nipple measuring 4 by 4 x 4 mm. The mass in the lateral left breast seen mammographically is a simple cyst. No axillary adenopathy. IMPRESSION: Highly suspicious left breast mass at a mid depth. Indeterminate adjacent lesion/mass measuring 4 mm at 12 o'clock, 2 cm from the nipple. Simple cyst in the lateral left  breast. No axillary adenopathy. RECOMMENDATION: Recommend ultrasound-guided biopsy of the dominant mass at 11:30 and the small  adjacent mass at 12 o'clock, 2 cm from the nipple. I have discussed the findings and recommendations with the patient. If applicable, a reminder letter will be sent to the patient regarding the next appointment. BI-RADS CATEGORY  5: Highly suggestive of malignancy. Electronically Signed   By: Dorise Bullion III M.D.   On: 02/24/2021 12:28  MM DIAG BREAST TOMO UNI LEFT  Result Date: 02/24/2021 CLINICAL DATA:  The patient was called back for 2 left breast masses. EXAM: DIGITAL DIAGNOSTIC UNILATERAL LEFT MAMMOGRAM WITH TOMOSYNTHESIS AND CAD; ULTRASOUND LEFT BREAST LIMITED TECHNIQUE: Left digital diagnostic mammography and breast tomosynthesis was performed. The images were evaluated with computer-aided detection.; Targeted ultrasound examination of the left breast was performed. COMPARISON:  Previous exam(s). ACR Breast Density Category b: There are scattered areas of fibroglandular density. FINDINGS: There is a spiculated mass at 12 o'clock in the left breast at a mid depth. There is an enlarging mass in the lateral posterior left breast. No other suspicious findings. Targeted ultrasound is performed, showing a suspicious mass in the left breast at 11:30, 2 cm from the nipple measuring 1.7 x 1.4 by 1.7 cm. There is an adjacent irregular mass at 12 o'clock, 2 cm from the nipple measuring 4 by 4 x 4 mm. The mass in the lateral left breast seen mammographically is a simple cyst. No axillary adenopathy. IMPRESSION: Highly suspicious left breast mass at a mid depth. Indeterminate adjacent lesion/mass measuring 4 mm at 12 o'clock, 2 cm from the nipple. Simple cyst in the lateral left breast. No axillary adenopathy. RECOMMENDATION: Recommend ultrasound-guided biopsy of the dominant mass at 11:30 and the small adjacent mass at 12 o'clock, 2 cm from the nipple. I have discussed the findings and  recommendations with the patient. If applicable, a reminder letter will be sent to the patient regarding the next appointment. BI-RADS CATEGORY  5: Highly suggestive of malignancy. Electronically Signed   By: Dorise Bullion III M.D.   On: 02/24/2021 12:28  MM CLIP PLACEMENT LEFT  Result Date: 02/25/2021 CLINICAL DATA:  Patient status post ultrasound-guided biopsy left breast masses. EXAM: 3D DIAGNOSTIC LEFT MAMMOGRAM POST ULTRASOUND BIOPSY COMPARISON:  Previous exam(s). FINDINGS: 3D Mammographic images were obtained following ultrasound guided biopsy of left breast masses. Site 1: Left breast mass 12 o'clock position: Ribbon shaped clip: In appropriate position. Site 2: Left breast mass 11:30 o'clock: Coil shaped clip: In appropriate position. IMPRESSION: Appropriate positioning of the biopsy marking clips as above. Final Assessment: Post Procedure Mammograms for Marker Placement Electronically Signed   By: Lovey Newcomer M.D.   On: 02/25/2021 08:43  [place] LT BREAST BX W LOC DEV 1ST LESION IMG BX SPEC US GUIDE  Addendum Date: 03/04/2021   ADDENDUM REPORT: 03/04/2021 19:06 ADDENDUM: Pathology revealed FIBROSIS AND USUAL DUCTAL HYPERPLASIA- NO MALIGNANCY of the LEFT breast, 12 o'clock (ribbon clip). This was found to be discordant by Dr. Lovey Newcomer, with excision recommended. Pathology revealed GRADE II INVASIVE DUCTAL CARCINOMA of the LEFT breast, 11:30 o'clock (coil clip). This was found to be concordant by Dr. Lovey Newcomer. Pathology results were discussed with the patient by telephone. The patient reported doing well after the biopsies with tenderness and bruising at the sites. Post biopsy instructions and care were reviewed and questions were answered. The patient was encouraged to call [facility] for any additional concerns. The patient was referred to [facility] on March 09, 2021. Pathology results  reported by Stacie Acres RN on 03/02/2021. Electronically Signed   By: Lovey Newcomer M.D.   On: 03/04/2021 19:06   Result Date: 03/04/2021 CLINICAL DATA:  Patient with indeterminate left breast mass 11:30 o'clock and 12 o'clock. EXAM: ULTRASOUND GUIDED LEFT BREAST CORE NEEDLE BIOPSY COMPARISON:  Previous exam(s). PROCEDURE: I met with the patient and we discussed the procedure of ultrasound-guided biopsy, including benefits and alternatives. We discussed the high likelihood of a successful procedure. We discussed the risks of the procedure, including infection, bleeding, tissue injury, clip migration, and inadequate sampling. Informed written consent was given. The usual time-out protocol was performed immediately prior to the procedure. Site 1: Left breast mass 12 o'clock position Lesion quadrant: Upper outer quadrant Using sterile technique and 1% Lidocaine as local anesthetic, under direct ultrasound visualization, a 14 gauge spring-loaded device was used to perform biopsy of left breast mass 12 o'clock position using a lateral approach. At the conclusion of the procedure ribbon tissue marker clip was deployed into the biopsy cavity. Follow up 2 view mammogram was performed and dictated separately. Site 2: Left breast mass 11:30 o'clock Lesion quadrant: Upper inner quadrant Using sterile technique and 1% Lidocaine as local anesthetic, under direct ultrasound visualization, a 14 gauge spring-loaded device was used to perform biopsy of left breast mass 11:30 o'clock using a lateral approach. At the conclusion of the procedure coil tissue marker clip was deployed into the biopsy cavity. Follow up 2 view mammogram was performed and dictated separately. IMPRESSION: Ultrasound guided biopsy of left breast mass 11:30 o'clock and 12 o'clock position. No apparent complications. Electronically Signed: By: Lovey Newcomer M.D. On: 02/25/2021 08:42  [place] LT BREAST BX W LOC DEV EA ADD LESION IMG BX SPEC US GUIDE  Addendum Date:  03/04/2021   ADDENDUM REPORT: 03/04/2021 19:06 ADDENDUM: Pathology revealed FIBROSIS AND USUAL DUCTAL HYPERPLASIA- NO MALIGNANCY of the LEFT breast, 12 o'clock (ribbon clip). This was found to be discordant by Dr. Lovey Newcomer, with excision recommended. Pathology revealed GRADE II INVASIVE DUCTAL CARCINOMA of the LEFT breast, 11:30 o'clock (coil clip). This was found to be concordant by Dr. Lovey Newcomer. Pathology results were discussed with the patient by telephone. The patient reported doing well after the biopsies with tenderness and bruising at the sites. Post biopsy instructions and care were reviewed and questions were answered. The patient was encouraged to call [facility] for any additional concerns. The patient was referred to [facility] on March 09, 2021. Pathology results reported by Stacie Acres RN on 03/02/2021. Electronically Signed   By: Lovey Newcomer M.D.   On: 03/04/2021 19:06   Result Date: 03/04/2021 CLINICAL DATA:  Patient with indeterminate left breast mass 11:30 o'clock and 12 o'clock. EXAM: ULTRASOUND GUIDED LEFT BREAST CORE NEEDLE BIOPSY COMPARISON:  Previous exam(s). PROCEDURE: I met with the patient and we discussed the procedure of ultrasound-guided biopsy, including benefits and alternatives. We discussed the high likelihood of a successful procedure. We discussed the risks of the procedure, including infection, bleeding, tissue injury, clip migration, and inadequate sampling. Informed written consent was given. The usual time-out protocol was performed immediately prior to the procedure. Site 1: Left breast mass 12 o'clock position Lesion quadrant: Upper outer quadrant Using sterile technique and 1% Lidocaine as local anesthetic, under direct ultrasound visualization, a 14 gauge spring-loaded device was used to perform biopsy of left breast mass 12 o'clock position using a lateral  approach.  At the conclusion of the procedure ribbon tissue marker clip was deployed into the biopsy cavity. Follow up 2 view mammogram was performed and dictated separately. Site 2: Left breast mass 11:30 o'clock Lesion quadrant: Upper inner quadrant Using sterile technique and 1% Lidocaine as local anesthetic, under direct ultrasound visualization, a 14 gauge spring-loaded device was used to perform biopsy of left breast mass 11:30 o'clock using a lateral approach. At the conclusion of the procedure coil tissue marker clip was deployed into the biopsy cavity. Follow up 2 view mammogram was performed and dictated separately. IMPRESSION: Ultrasound guided biopsy of left breast mass 11:30 o'clock and 12 o'clock position. No apparent complications. Electronically Signed: By: Lovey Newcomer M.D. On: 02/25/2021 08:42  XR Elbow Complete Right (3+View)  Result Date: 03/07/2021 Right elbow 3 views: Elbow is well located.  No acute fractures.  No bony abnormalities.  Elbow joints well-maintained.      IMPRESSION: Stage !A (cT1c, cN0, cM0) Left Breast UIQ, Invasive Ductal Carcinoma, Grade 2;   Patient will be a good candidate for breast conservation with radiotherapy to the left breast. We discussed the general course of radiation, potential side effects, and toxicities with radiation and the patient is interested in this approach if indicated after her surgery   PLAN:  Left lumpectomy and SLN XRT depending on final pathology and  nodal status  Aromatase inhibitor     ------------------------------------------------  Blair Promise, PhD, MD  This document serves as a record of services personally performed by Gery Pray, MD. It was created on his behalf by Roney Mans, a trained medical scribe. The creation of this record is based on the scribe's personal observations and the provider's statements to them. This document has been checked and approved by the attending provider.

## 2021-03-08 NOTE — Progress Notes (Signed)
[facility] NOTE  Patient Care Team: Kelton Pillar, MD as PCP - General (Family Medicine) Bensimhon, Shaune Pascal, MD as PCP - Advanced Heart Failure (Cardiology) Mauro Kaufmann, RN as Oncology Nurse Navigator Rockwell Germany, RN as Oncology Nurse Navigator Coralie Keens, MD as Consulting Physician (General Surgery) Nicholas Lose, MD as Consulting Physician (Hematology and Oncology) Gery Pray, MD as Consulting Physician (Radiation Oncology)  CHIEF COMPLAINTS/PURPOSE OF CONSULTATION:  Newly diagnosed breast cancer  HISTORY OF PRESENTING ILLNESS:  Deanna Johnston 76 y.o. female is here because of recent diagnosis of invasive ductal carcinoma of the left breast. Screening mammogram on 02/02/2021 showed possible masses in the left breast. Diagnostic mammogram and [place] on 02/24/2021 showed a spiculated mass at 12 o'clock in the left breast at a mid depth and an enlarging mass in the lateral posterior left breast. Biopsy on 02/25/2021 showed grade 2 invasive ductal carcinoma. She presents to the clinic today for initial evaluation and discussion of treatment options.   I reviewed her records extensively and collaborated the history with the patient.  SUMMARY OF ONCOLOGIC HISTORY: Oncology History  Malignant neoplasm of upper-inner quadrant of left female breast ([place])  02/25/2021 Initial Diagnosis   Screening mammogram: possible masses in the left breast.  11:30 position: 1.7 cm mass and biopsy grade 2 IDC prognostic panel pending, adjacent 0.4 cm mass at 4 o'clock position: Biopsy benign (discordant), axilla negative     MEDICAL HISTORY:  Past Medical History:  Diagnosis Date   Cataract    Cervical dysplasia    COPD (chronic obstructive pulmonary disease) ([place])    Coronary artery disease    Hyperlipidemia    Hypertension    MI (myocardial infarction) ([place]) 01/2010   stent mid right coronary   Obesity    Tobacco dependence    VAIN (vaginal  intraepithelial neoplasia) 2011   Efudex treatment    SURGICAL HISTORY: Past Surgical History:  Procedure Laterality Date   ABDOMINAL HYSTERECTOMY     TAH BSO   anal fistula repair     CARDIAC CATHETERIZATION  2011 Dec.   stent mid right coronary,Primus element stent   CATARACT EXTRACTION     COLPOSCOPY     OOPHORECTOMY     BSO   OTHER SURGICAL HISTORY     hysterectomy   RIGHT/LEFT HEART CATH AND CORONARY ANGIOGRAPHY N/A 07/26/2016   Procedure: Right/Left Heart Cath and Coronary Angiography;  Surgeon: Jolaine Artist, MD;  Location: [facility];  Service: Cardiovascular;  Laterality: N/A;    SOCIAL HISTORY: Social History   Socioeconomic History   Marital status: Single    Spouse name: Not on file   Number of children: Not on file   Years of education: Not on file   Highest education level: Not on file  Occupational History   Occupation: Retired    Fish farm manager: RETIRED  Tobacco Use   Smoking status: Former    Packs/day: 0.50    Years: 47.00    Pack years: 23.50    Types: Cigarettes    Quit date: 07/20/2016    Years since quitting: 4.6   Smokeless tobacco: Never  Vaping Use   Vaping Use: Never used  Substance and Sexual Activity   Alcohol use: Not Currently   Drug use: No   Sexual activity: Not Currently    Birth control/protection: Surgical    Comment: HYST-1st intercourse 76 yo-More than 5 partners  Other Topics Concern   Not on file  Social  History Narrative   Not on file   Social Determinants of Health   Financial Resource Strain: Not on file  Food Insecurity: Not on file  Transportation Needs: Not on file  Physical Activity: Not on file  Stress: Not on file  Social Connections: Not on file  Intimate Partner Violence: Not on file    FAMILY HISTORY: Family History  Problem Relation Age of Onset   Heart failure Mother    Stomach cancer Mother    Heart disease Father    Diabetes Father    Colon cancer Sister    Heart disease Brother      ALLERGIES:  is allergic to lipitor [atorvastatin], crestor [rosuvastatin calcium], prednisone, sulfa antibiotics, synthroid [levothyroxine sodium], and niaspan [niacin].  MEDICATIONS:  Current Outpatient Medications  Medication Sig Dispense Refill   furosemide (LASIX) 40 MG tablet Take 1 tablet by mouth once daily 30 tablet 6   nitroGLYCERIN (NITROSTAT) 0.4 MG SL tablet DISSOLVE ONE TABLET UNDER THE TONGUE EVERY 5 MINUTES AS NEEDED FOR CHEST PAIN. 25 tablet 1   NP THYROID 60 MG tablet Take 60 mg by mouth daily.     nystatin cream (MYCOSTATIN) Apply 1 application topically as needed for dry skin.     OXYGEN 2lpm with sleep  AHC     potassium chloride SA (KLOR-CON) 20 MEQ tablet Take 1 tablet by mouth once daily 30 tablet 11   albuterol (PROAIR HFA) 108 (90 Base) MCG/ACT inhaler 2 puffs every 4 hours as needed only  if your can't catch your breath 1 Inhaler 1   Ascorbic Acid (VITAMIN C) 100 MG tablet Take 100 mg by mouth daily.     aspirin EC 81 MG tablet Take 81 mg by mouth daily.     b complex vitamins tablet Take 1 tablet by mouth daily.     Ginger 500 MG CAPS Take by mouth.     Honey 5 GM/5ML SYRP Take by mouth.     losartan (COZAAR) 50 MG tablet Take 1 tablet (50 mg total) by mouth daily. 90 tablet 3   No current facility-administered medications for this visit.    REVIEW OF SYSTEMS:   Constitutional: Denies fevers, chills or abnormal night sweats Eyes: Denies blurriness of vision, double vision or watery eyes Ears, nose, mouth, throat, and face: Denies mucositis or sore throat Respiratory: Denies cough, dyspnea or wheezes Cardiovascular: Denies palpitation, chest discomfort or lower extremity swelling Gastrointestinal:  Denies nausea, heartburn or change in bowel habits Skin: Denies abnormal skin rashes Lymphatics: Denies new lymphadenopathy or easy bruising Neurological:Denies numbness, tingling or new weaknesses Behavioral/Psych: Mood is stable, no new changes  Breast:   Denies any palpable lumps or discharge All other systems were reviewed with the patient and are negative.  PHYSICAL EXAMINATION: ECOG PERFORMANCE STATUS: 1 - Symptomatic but completely ambulatory  Vitals:   03/09/21 1248  BP: (!) 189/65  Pulse: 86  Resp: 19  Temp: 97.8 F (36.6 C)  SpO2: 92%   Filed Weights   03/09/21 1248  Weight: 159 lb 12.8 oz (72.5 kg)      LABORATORY DATA:  I have reviewed the data as listed Lab Results  Component Value Date   WBC 8.5 03/09/2021   HGB 15.8 (H) 03/09/2021   HCT 49.8 (H) 03/09/2021   MCV 85.9 03/09/2021   PLT 243 03/09/2021   Lab Results  Component Value Date   NA 140 03/09/2021   K 3.9 03/09/2021   CL 105 03/09/2021   CO2  30 03/09/2021    RADIOGRAPHIC STUDIES: I have personally reviewed the radiological reports and agreed with the findings in the report.  ASSESSMENT AND PLAN:  Malignant neoplasm of upper-inner quadrant of left female breast ([place]) 02/25/2021:Screening mammogram: possible masses in the left breast.  11:30 position: 1.7 cm mass and biopsy grade 2 IDC prognostic panel pending, adjacent 0.4 cm mass at 4 o'clock position: Biopsy benign (discordant), axilla negative  Pathology and radiology counseling: Discussed with the patient, the details of pathology including the type of breast cancer,the clinical staging, the significance of ER, PR and HER-2/neu receptors and the implications for treatment. After reviewing the pathology in detail, we proceeded to discuss the different treatment options between surgery, radiation, chemotherapy, antiestrogen therapies.  Treatment plan: 1.  Breast conserving surgery 2. adjuvant therapy based upon prognostic panel 3.  Adjuvant radiation  Return to clinic after surgery to discuss final pathology report   All questions were answered. The patient knows to call the clinic with any problems, questions or concerns.   Rulon Eisenmenger, MD, MPH 03/09/2021    I, Thana Ates, am acting  as scribe for Nicholas Lose, MD.  I have reviewed the above documentation for accuracy and completeness, and I agree with the above.

## 2021-03-09 ENCOUNTER — Other Ambulatory Visit: Payer: Self-pay

## 2021-03-09 ENCOUNTER — Ambulatory Visit (HOSPITAL_BASED_OUTPATIENT_CLINIC_OR_DEPARTMENT_OTHER): Payer: Medicare Other | Admitting: Genetic Counselor

## 2021-03-09 ENCOUNTER — Inpatient Hospital Stay (HOSPITAL_BASED_OUTPATIENT_CLINIC_OR_DEPARTMENT_OTHER): Payer: Medicare Other | Admitting: Hematology and Oncology

## 2021-03-09 ENCOUNTER — Encounter: Payer: Self-pay | Admitting: General Practice

## 2021-03-09 ENCOUNTER — Ambulatory Visit: Payer: Medicare Other | Attending: Surgery | Admitting: Physical Therapy

## 2021-03-09 ENCOUNTER — Inpatient Hospital Stay: Payer: Medicare Other | Attending: Hematology and Oncology

## 2021-03-09 ENCOUNTER — Encounter: Payer: Self-pay | Admitting: *Deleted

## 2021-03-09 ENCOUNTER — Other Ambulatory Visit: Payer: Self-pay | Admitting: Surgery

## 2021-03-09 ENCOUNTER — Ambulatory Visit
Admission: RE | Admit: 2021-03-09 | Discharge: 2021-03-09 | Disposition: A | Discharge status: Home / Self Care | Payer: Medicare Other | Source: Ambulatory Visit | Attending: Radiation Oncology | Admitting: Radiation Oncology

## 2021-03-09 ENCOUNTER — Encounter: Payer: Self-pay | Admitting: Physical Therapy

## 2021-03-09 DIAGNOSIS — Z87891 Personal history of nicotine dependence: Secondary | ICD-10-CM | POA: Insufficient documentation

## 2021-03-09 DIAGNOSIS — Z853 Personal history of malignant neoplasm of breast: Secondary | ICD-10-CM

## 2021-03-09 DIAGNOSIS — C50212 Malignant neoplasm of upper-inner quadrant of left female breast: Secondary | ICD-10-CM

## 2021-03-09 DIAGNOSIS — Z90710 Acquired absence of both cervix and uterus: Secondary | ICD-10-CM | POA: Insufficient documentation

## 2021-03-09 DIAGNOSIS — Z170 Estrogen receptor positive status [ER+]: Secondary | ICD-10-CM

## 2021-03-09 DIAGNOSIS — Z800 Family history of malignant neoplasm of digestive organs: Secondary | ICD-10-CM | POA: Insufficient documentation

## 2021-03-09 DIAGNOSIS — Z9079 Acquired absence of other genital organ(s): Secondary | ICD-10-CM | POA: Diagnosis not present

## 2021-03-09 DIAGNOSIS — Z90722 Acquired absence of ovaries, bilateral: Secondary | ICD-10-CM | POA: Insufficient documentation

## 2021-03-09 DIAGNOSIS — C50912 Malignant neoplasm of unspecified site of left female breast: Secondary | ICD-10-CM | POA: Diagnosis not present

## 2021-03-09 DIAGNOSIS — R293 Abnormal posture: Secondary | ICD-10-CM | POA: Diagnosis not present

## 2021-03-09 LAB — CMP (CANCER CENTER ONLY)
ALT: 14 U/L (ref 0–44)
AST: 17 U/L (ref 15–41)
Albumin: 4.1 g/dL (ref 3.5–5.0)
Alkaline Phosphatase: 90 U/L (ref 38–126)
Anion gap: 5 (ref 5–15)
BUN: 15 mg/dL (ref 8–23)
CO2: 30 mmol/L (ref 22–32)
Calcium: 9.7 mg/dL (ref 8.9–10.3)
Chloride: 105 mmol/L (ref 98–111)
Creatinine: 0.94 mg/dL (ref 0.44–1.00)
GFR, Estimated: >60 mL/min (ref >60)
Glucose, Bld: 93 mg/dL (ref 70–99)
Potassium: 3.9 mmol/L (ref 3.5–5.1)
Sodium: 140 mmol/L (ref 135–145)
Total Bilirubin: 0.6 mg/dL (ref 0.3–1.2)
Total Protein: 7.7 g/dL (ref 6.5–8.1)

## 2021-03-09 LAB — CBC WITH DIFFERENTIAL (CANCER CENTER ONLY)
Abs Immature Granulocytes: 0.01 10*3/uL (ref 0.00–0.07)
Basophils Absolute: 0.1 10*3/uL (ref 0.0–0.1)
Basophils Relative: 1 %
Eosinophils Absolute: 0.1 10*3/uL (ref 0.0–0.5)
Eosinophils Relative: 1 %
HCT: 49.8 % — ABNORMAL HIGH (ref 36.0–46.0)
Hemoglobin: 15.8 g/dL — ABNORMAL HIGH (ref 12.0–15.0)
Immature Granulocytes: 0 %
Lymphocytes Relative: 24 %
Lymphs Abs: 2 10*3/uL (ref 0.7–4.0)
MCH: 27.2 pg (ref 26.0–34.0)
MCHC: 31.7 g/dL (ref 30.0–36.0)
MCV: 85.9 fL (ref 80.0–100.0)
Monocytes Absolute: 0.7 10*3/uL (ref 0.1–1.0)
Monocytes Relative: 9 %
Neutro Abs: 5.6 10*3/uL (ref 1.7–7.7)
Neutrophils Relative %: 65 %
Platelet Count: 243 10*3/uL (ref 150–400)
RBC: 5.8 MIL/uL — ABNORMAL HIGH (ref 3.87–5.11)
RDW: 14.8 % (ref 11.5–15.5)
WBC Count: 8.5 10*3/uL (ref 4.0–10.5)
nRBC: 0 % (ref 0.0–0.2)

## 2021-03-09 LAB — GENETIC SCREENING ORDER

## 2021-03-09 NOTE — Progress Notes (Signed)
[facility] Psychosocial Distress Screening Spiritual Care  Met with Deanna Johnston and her niece Deanna Johnston in [facility] to introduce [facility] team/resources, reviewing distress screen per protocol.  The patient scored a 10 on the Psychosocial Distress Thermometer which indicates severe distress. Also assessed for distress and other psychosocial needs.   ONCBCN DISTRESS SCREENING 03/09/2021  Screening Type Initial Screening  Distress experienced in past week (1-10) 10  Emotional problem type Nervousness/Anxiety  Information Concerns Type Lack of info about diagnosis;Lack of info about treatment;Lack of info about complementary therapy choices;Lack of info about maintaining fitness  Physical Problem type Breathing;Tingling hands/feet  Referral to support programs Yes   Deanna Johnston reports good support from her siblings and niece. She enjoys crochet and meetings of social groups, from retirees to [employer]. A trip that she took years ago to [place] and [place] continues to be meaningful to her. She notes that meeting treatment team in [facility] helped reduce her distress to a 7. Introduced [place] and [employer] as additional layers of support.  Follow up needed: Yes.  We plan to follow up by phone in ca two weeks.   [place], [place], [facility] Pager [phone] Voicemail [phone]

## 2021-03-09 NOTE — Therapy (Signed)
OUTPATIENT PHYSICAL THERAPY BREAST CANCER BASELINE EVALUATION   Patient Name: Deanna Johnston MRN: [id number] DOB:[birth year], 76 y.o., female Today's Date: 03/09/2021   PT End of Session - 03/09/21 1657     Visit Number 1    Number of Visits 2    Date for PT Re-Evaluation 05/04/21    PT Start Time 1314    PT Stop Time 1330   Also saw pt from 1351-1407 for a total of 32 min   PT Time Calculation (min) 16 min    Activity Tolerance Patient tolerated treatment well    Behavior During Therapy [facility] for tasks assessed/performed             Past Medical History:  Diagnosis Date   Cataract    Cervical dysplasia    COPD (chronic obstructive pulmonary disease) ([place])    Coronary artery disease    Hyperlipidemia    Hypertension    MI (myocardial infarction) ([place] [place]) 01/2010   stent mid right coronary   Obesity    Tobacco dependence    VAIN (vaginal intraepithelial neoplasia) 2011   Efudex treatment   Past Surgical History:  Procedure Laterality Date   ABDOMINAL HYSTERECTOMY     TAH BSO   anal fistula repair     CARDIAC CATHETERIZATION  2011 Dec.   stent mid right coronary,Primus element stent   CATARACT EXTRACTION     COLPOSCOPY     OOPHORECTOMY     BSO   OTHER SURGICAL HISTORY     hysterectomy   RIGHT/LEFT HEART CATH AND CORONARY ANGIOGRAPHY N/A 07/26/2016   Procedure: Right/Left Heart Cath and Coronary Angiography;  Surgeon: Jolaine Artist, MD;  Location: [facility];  Service: Cardiovascular;  Laterality: N/A;   Patient Active Problem List   Diagnosis Date Noted   Malignant neoplasm of upper-inner quadrant of left female breast ([place]) 03/08/2021   Myalgia due to statin 11/19/2020   Pain in right hand 03/21/2018   Cor pulmonale ([place]) 09/25/2016   Olecranon bursitis of left elbow 08/31/2016   Chronic respiratory failure with hypoxia ([place]) 08/17/2016   Right heart failure (H[facility])    Hypoxia    PAH (pulmonary artery hypertension) ([place])    Pressure injury of skin  07/21/2016   Hypotension    Cardiogenic shock ([place])    Right ventricular failure ([place])    Acute respiratory failure with hypoxia and hypercapnia ([place])    Acute on chronic heart failure ([place]) 07/20/2016   COPD GOLD II 03/12/2013   Cough 03/11/2013   Essential hypertension, benign 03/11/2013   Hyperlipidemia    Coronary artery disease    Obesity    MI (myocardial infarction) ([place])    Tobacco dependence     PCP: Kelton Pillar, MD  REFERRING PROVIDER: Coralie Keens, MD  REFERRING DIAG: Left breast cancer  THERAPY DIAG:  Malignant neoplasm of upper-inner quadrant of left breast in female, estrogen receptor positive ([place])  Abnormal posture  ONSET DATE: 02/02/2021  SUBJECTIVE  SUBJECTIVE STATEMENT: Patient reports she is here today to be seen by her medical team for her newly diagnosed left breast cancer.   PERTINENT HISTORY:  Patient was diagnosed on 02/02/2021 with left grade II invasive ductal carcinoma breast cancer. It measures 1.7 cm and is located in the upper inner quadrant. It is ER/PR positive and HER2 equivalent with a Ki67 of 5%. She walks with a rolling walker and has COPD.  PATIENT GOALS   reduce lymphedema risk and learn post op HEP.   PAIN:  Are you having pain? Yes NPRS scale: 6/10 Pain location: low back Pain orientation: Lower  PAIN TYPE: aching Pain description: intermittent  Aggravating factors: Standing  Relieving factors: Sitting  PRECAUTIONS: Active CA  WEIGHT BEARING RESTRICTIONS No  FALLS:  Has patient fallen in last 6 months? No, Number of falls: 0  LIVING ENVIRONMENT: Patient lives with: alone Lives in: House/apartment Has following equipment at home: None  OCCUPATION: Retired  LEISURE: She does not exercise  PRIOR LEVEL OF FUNCTION:  Independent   OBJECTIVE  COGNITION:  Overall cognitive status: Within functional limits for tasks assessed    POSTURE:  Forward head and rounded shoulders posture  UPPER EXTREMITY AROM/PROM:  A/PROM Right 03/09/2021 Left 03/09/2021  Shoulder extension 53 47  Shoulder flexion 156 150  Shoulder abduction 153 148  Shoulder internal rotation 73 68  Shoulder external rotation 70 68    (Blank rows = not tested)    CERVICAL AROM: All within normal limits  UPPER EXTREMITY STRENGTH: WNL   LYMPHEDEMA ASSESSMENTS:   LANDMARK RIGHT 03/09/2021 LEFT 03/09/2021  10 cm proximal to olecranon process 32 31.6  Olecranon process 26 25.9  10 cm proximal to ulnar styloid process 23.5 21.9  Just proximal to ulnar styloid process 16.7 16.2  Across hand at thumb web space 19.5 19.1  At base of 2nd digit 6.9 6.5  (Blank rows = not tested)   L-DEX LYMPHEDEMA SCREENING:  The patient was assessed using the L-Dex machine today to produce a lymphedema index baseline score. The patient will be reassessed on a regular basis (typically every 3 months) to obtain new L-Dex scores. If the score is > 6.5 points away from his/her baseline score indicating onset of subclinical lymphedema, it will be recommended to wear a compression garment for 4 weeks, 12 hours per day and then be reassessed. If the score continues to be > 6.5 points from baseline at reassessment, we will initiate lymphedema treatment. Assessing in this manner has a 95% rate of preventing clinically significant lymphedema.   L-DEX FLOWSHEETS - 03/09/21 1700       L-DEX LYMPHEDEMA SCREENING   Measurement Type Unilateral    L-DEX MEASUREMENT EXTREMITY Upper Extremity    POSITION  Standing    DOMINANT SIDE Right    At Risk Side Left    BASELINE SCORE (UNILATERAL) -6.1             QUICK DASH SURVEY:  Deanna Johnston - 03/09/21 0001     Open a tight or new jar No difficulty    Do heavy household chores (wash walls, wash floors)  Moderate difficulty    Carry a shopping bag or briefcase No difficulty    Wash your back No difficulty    Use a knife to cut food No difficulty    Recreational activities in which you take some force or impact through your arm, shoulder, or hand (golf, hammering, tennis) Unable    During the past week, to  what extent has your arm, shoulder or hand problem interfered with your normal social activities with family, friends, neighbors, or groups? Not at all    During the past week, to what extent has your arm, shoulder or hand problem limited your work or other regular daily activities Not at all    Arm, shoulder, or hand pain. Moderate    Tingling (pins and needles) in your arm, shoulder, or hand Moderate    Difficulty Sleeping No difficulty    DASH Score 22.73 %              PATIENT EDUCATION:  Education details: Lymphedema risk reduction and post op shoulder/posture HEP Person educated: Patient Education method: Explanation, Demonstration, Handout Education comprehension: Patient verbalized understanding and returned demonstration   HOME EXERCISE PROGRAM: Patient was instructed today in a home exercise program today for post op shoulder range of motion. These included active assist shoulder flexion in sitting, scapular retraction, wall walking with shoulder abduction, and hands behind head external rotation.  She was encouraged to do these twice a day, holding 3 seconds and repeating 5 times when permitted by her physician.   ASSESSMENT:  CLINICAL IMPRESSION: Patient was diagnosed on 02/02/2021 with left grade II invasive ductal carcinoma breast cancer. It measures 1.7 cm and is located in the upper inner quadrant. It is ER/PR positive and HER2 equivalent with a Ki67 of 5%. She walks with a rolling walker and has COPD.Her multidisciplinary medical team met prior to her assessments to determine a recommended treatment plan. She is planning to have a left lumpectomy and sentinel node  biopsy followed by radiation and anti-estrogen therapy. She will benefit from a post op PT reassessment to determine needs and from L-Dex screens every 3 months for 2 years to detect subclinical lymphedema.  Pt will benefit from skilled therapeutic intervention to improve on the following deficits: Decreased knowledge of precautions, impaired UE functional use, pain, decreased ROM, postural dysfunction.   PT treatment/interventions: ADL/self-care home management, pt/family education, therapeutic exercise  REHAB POTENTIAL: Good  CLINICAL DECISION MAKING: Stable/uncomplicated  EVALUATION COMPLEXITY: Low   GOALS: Goals reviewed with patient? YES  LONG TERM GOALS: (STG=LTG)   Name Target Date Goal status  1 Pt will be able to verbalize understanding of pertinent lymphedema risk reduction practices relevant to her dx specifically related to skin care.  Baseline:  No knowledge 03/09/2021 Achieved at eval  2 Pt will be able to return demo and/or verbalize understanding of the post op HEP related to regaining shoulder ROM. Baseline:  No knowledge 03/09/2021 Achieved at eval  3 Pt will be able to verbalize understanding of the importance of attending the post op After Breast CA Class for further lymphedema risk reduction education and therapeutic exercise.  Baseline:  No knowledge 03/09/2021 Achieved at eval  4 Pt will demo she has regained full shoulder ROM and function post operatively compared to baselines.  Baseline: See objective measurements taken today. 05/04/2021      PLAN: PT FREQUENCY/DURATION: EVAL and 1 follow up appointment.   PLAN FOR NEXT SESSION: will reassess 3-4 weeks post op to determine needs.   Patient will follow up at outpatient cancer rehab 3-4 weeks following surgery.  If the patient requires physical therapy at that time, a specific plan will be dictated and sent to the referring physician for approval. The patient was educated today on appropriate basic range of  motion exercises to begin post operatively and the importance of attending the After Breast Cancer  class following surgery.  Patient was educated today on lymphedema risk reduction practices as it pertains to recommendations that will benefit the patient immediately following surgery.  She verbalized good understanding.    Physical Therapy Information for After Breast Cancer Surgery/Treatment:  Lymphedema is a swelling condition that you may be at risk for in your arm if you have lymph nodes removed from the armpit area.  After a sentinel node biopsy, the risk is approximately 5-9% and is higher after an axillary node dissection.  There is treatment available for this condition and it is not life-threatening.  Contact your physician or physical therapist with concerns. You may begin the 4 shoulder/posture exercises (see additional sheet) when permitted by your physician (typically a week after surgery).  If you have drains, you may need to wait until those are removed before beginning range of motion exercises.  A general recommendation is to not lift your arms above shoulder height until drains are removed.  These exercises should be done to your tolerance and gently.  This is not a "no pain/no gain" type of recovery so listen to your body and stretch into the range of motion that you can tolerate, stopping if you have pain.  If you are having immediate reconstruction, ask your plastic surgeon about doing exercises as he or she may want you to wait. We encourage you to attend the free one time ABC (After Breast Cancer) class offered by [facility].  You will learn information related to lymphedema risk, prevention and treatment and additional exercises to regain mobility following surgery.  You can call [phone] for more information.  This is offered the 1st and 3rd Monday of each month.  You only attend the class one time. While undergoing any medical procedure or treatment, try  to avoid blood pressure being taken or needle sticks from occurring on the arm on the side of cancer.   This recommendation begins after surgery and continues for the rest of your life.  This may help reduce your risk of getting lymphedema (swelling in your arm). An excellent resource for those seeking information on lymphedema is the National Lymphedema Network's web site. It can be accessed at [facility].org If you notice swelling in your hand, arm or breast at any time following surgery (even if it is many years from now), please contact your doctor or physical therapist to discuss this.  Lymphedema can be treated at any time but it is easier for you if it is treated early on.  If you feel like your shoulder motion is not returning to normal in a reasonable amount of time, please contact your surgeon or physical therapist.  [facility]. [place], [place], [place] [phone]; [address] [place], [place] [address] ABC CLASS After Breast Cancer Class  After Breast Cancer Class is a specially designed exercise class to assist you in a safe recover after having breast cancer surgery.  In this class you will learn how to get back to full function whether your drains were just removed or if you had surgery a month ago.  This one-time class is held the 1st and 3rd Monday of every month from 11:00 a.m. until 12:00 noon at the [facility] located at [place], [place] [address]  This class is FREE and space is limited. For more information or to register for the next available class, call [phone].  Class Goals  Understand specific stretches to improve the flexibility  of you chest and shoulder. Learn ways to safely strengthen your upper body and improve your posture. Understand the warning signs of infection and why you may be at risk for an arm infection. Learn about Lymphedema and prevention.  ** You do not attend this class until after surgery.  Drains  must be removed to participate  Patient was instructed today in a home exercise program today for post op shoulder range of motion. These included active assist shoulder flexion in sitting, scapular retraction, wall walking with shoulder abduction, and hands behind head external rotation.  She was encouraged to do these twice a day, holding 3 seconds and repeating 5 times when permitted by her physician.  Annia Friendly, [place] 03/09/21 5:14 PM

## 2021-03-09 NOTE — Assessment & Plan Note (Signed)
02/25/2021:Screening mammogram: possible masses in the left breast.  11:30 position: 1.7 cm mass and biopsy grade 2 IDC prognostic panel pending, adjacent 0.4 cm mass at 4 o'clock position: Biopsy benign (discordant), axilla negative  Pathology and radiology counseling: Discussed with the patient, the details of pathology including the type of breast cancer,the clinical staging, the significance of ER, PR and HER-2/neu receptors and the implications for treatment. After reviewing the pathology in detail, we proceeded to discuss the different treatment options between surgery, radiation, chemotherapy, antiestrogen therapies.  Treatment plan: 1.  Breast conserving surgery 2. adjuvant therapy based upon prognostic panel 3.  Adjuvant radiation  Return to clinic after surgery to discuss final pathology report

## 2021-03-10 ENCOUNTER — Encounter: Payer: Self-pay | Admitting: Genetic Counselor

## 2021-03-10 DIAGNOSIS — Z800 Family history of malignant neoplasm of digestive organs: Secondary | ICD-10-CM | POA: Insufficient documentation

## 2021-03-10 NOTE — Progress Notes (Signed)
REFERRING PROVIDER: Nicholas Lose, MD [address] [place], [place] [address]  PRIMARY PROVIDER:  Kelton Pillar, MD  PRIMARY REASON FOR VISIT:  1. Malignant neoplasm of upper-inner quadrant of left breast in female, estrogen receptor positive ([place])   2. Family history of colon cancer   3. Family history of stomach cancer     HISTORY OF PRESENT ILLNESS:   Deanna Johnston, a 76 y.o. female, was seen for a [facility] cancer genetics consultation at the request of Dr. Lindi Adie due to a personal and family history of cancer.  Deanna Johnston presents to clinic today to discuss the possibility of a hereditary predisposition to cancer, to discuss genetic testing, and to further clarify her future cancer risks, as well as potential cancer risks for family members.   In January 2023, at the age of 1, Deanna Johnston was diagnosed with invasive ductal carcinoma of the left breast.   CANCER HISTORY:  Oncology History  Malignant neoplasm of upper-inner quadrant of left female breast ([facility])  02/25/2021 Initial Diagnosis   Screening mammogram: possible masses in the left breast.  11:30 position: 1.7 cm mass and biopsy grade 2 IDC prognostic panel pending, adjacent 0.4 cm mass at 4 o'clock position: Biopsy benign (discordant), axilla negative   03/09/2021 Cancer Staging   Staging form: Breast, AJCC 8th Edition - Clinical stage from 03/09/2021: Stage IA (cT1c, cN0, cM0, G2, ER+, PR+, HER2: Equivocal) - Signed by Nicholas Lose, MD on 03/09/2021 Stage prefix: Initial diagnosis Histologic grading system: 3 grade system      RISK FACTORS:  Menarche was at age 68-13.  She has never given birth.  Ovaries intact: yes.  Uterus intact: yes.  Menopausal status: postmenopausal.  HRT use: ~10 years. Colonoscopy: yes Mammogram within the last year: yes. Any excessive radiation exposure in the past: no  Past Medical History:  Diagnosis Date   Cataract    Cervical dysplasia    COPD (chronic obstructive  pulmonary disease) ([facility])    Coronary artery disease    Hyperlipidemia    Hypertension    MI (myocardial infarction) ([place]) 01/2010   stent mid right coronary   Obesity    Tobacco dependence    VAIN (vaginal intraepithelial neoplasia) 2011   Efudex treatment    Past Surgical History:  Procedure Laterality Date   ABDOMINAL HYSTERECTOMY     TAH BSO   anal fistula repair     CARDIAC CATHETERIZATION  2011 Dec.   stent mid right coronary,Primus element stent   CATARACT EXTRACTION     COLPOSCOPY     OOPHORECTOMY     BSO   OTHER SURGICAL HISTORY     hysterectomy   RIGHT/LEFT HEART CATH AND CORONARY ANGIOGRAPHY N/A 07/26/2016   Procedure: Right/Left Heart Cath and Coronary Angiography;  Surgeon: Jolaine Artist, MD;  Location: [facility];  Service: Cardiovascular;  Laterality: N/A;    Social History   Socioeconomic History   Marital status: Single    Spouse name: Not on file   Number of children: Not on file   Years of education: Not on file   Highest education level: Not on file  Occupational History   Occupation: Retired    Fish farm manager: RETIRED  Tobacco Use   Smoking status: Former    Packs/day: 0.50    Years: 47.00    Pack years: 23.50    Types: Cigarettes    Quit date: 07/20/2016    Years since quitting: 4.6   Smokeless tobacco: Never  Vaping Use   Vaping Use: Never used  Substance and Sexual Activity   Alcohol use: Not Currently   Drug use: No   Sexual activity: Not Currently    Birth control/protection: Surgical    Comment: HYST-1st intercourse 76 yo-More than 5 partners  Other Topics Concern   Not on file  Social History Narrative   Not on file   Social Determinants of Health   Financial Resource Strain: Not on file  Food Insecurity: Not on file  Transportation Needs: Not on file  Physical Activity: Not on file  Stress: Not on file  Social Connections: Not on file     FAMILY HISTORY:  We obtained a detailed, 4-generation family history.   Significant diagnoses are listed below: Family History  Problem Relation Age of Onset   Heart failure Mother    Stomach cancer Mother 60   Heart disease Father    Diabetes Father    Colon cancer Sister 46   Melanoma Sister    Heart disease Brother    Cancer Maternal Grandmother        unknown type    Deanna Johnston reports a sister with a history of melanoma diagnosed at an unknown age and another sister with a history of colon cancer diagnosed at 68. Her mother was diagnosed with stomach cancer at age 41 and died at age 75. Her maternal grandmother was diagnosed with an unknown type of cancer, she is deceased. Deanna Johnston is unaware of previous family history of genetic testing for hereditary cancer risks. There is no reported Ashkenazi Jewish ancestry.   GENETIC COUNSELING ASSESSMENT: Deanna Johnston is a 76 y.o. female with a personal and family history of cancer which is somewhat suggestive of a hereditary predisposition to cancer. We, therefore, discussed and recommended the following at today's visit.   DISCUSSION: We discussed that 5 - 10% of cancer is hereditary, with most cases of breast associated with BRCA1/2.  There are other genes that can be associated with hereditary breast cancer syndromes.  We discussed that testing is beneficial for several reasons including knowing how to follow individuals after completing their treatment, identifying whether potential treatment options would be beneficial, and understanding if other family members could be at risk for cancer and allowing them to undergo genetic testing.   We reviewed the characteristics, features and inheritance patterns of hereditary cancer syndromes. We also discussed genetic testing, including the appropriate family members to test, the process of testing, insurance coverage and turn-around-time for results. We discussed the implications of a negative, positive, carrier and/or variant of uncertain significant result. We  recommended Deanna Johnston pursue genetic testing for a panel that includes genes associated with breast, colon, and stomach cancer.   Deanna Johnston  was offered a common hereditary cancer panel (36 genes) and an expanded pan-cancer panel (84 genes). Deanna Johnston was informed of the benefits and limitations of each panel, including that expanded pan-cancer panels contain genes that do not have clear management guidelines at this point in time.  We also discussed that as the number of genes included on a panel increases, the chances of variants of uncertain significance increases.  After considering the benefits and limitations of each gene panel, Deanna Johnston elected to have [facility] Panel+RNA.  The CancerNext gene panel offered by [employer] includes sequencing, rearrangement analysis, and RNA analysis for the following 36 genes:   APC, ATM, AXIN2, BARD1, BMPR1A, BRCA1, BRCA2, BRIP1, CDH1, CDK4, CDKN2A, CHEK2, DICER1, HOXB13, EPCAM, GREM1, MLH1,  MSH2, MSH3, MSH6, MUTYH, NBN, NF1, NTHL1, PALB2, PMS2, POLD1, POLE, PTEN, RAD51C, RAD51D, RECQL, SMAD4, SMARCA4, STK11, and TP53.    Based on Deanna Johnston's personal and family history of cancer, she does not meet medical criteria for genetic testing. She may have an out of pocket cost. We discussed that if her out of pocket cost for testing is over $100, the laboratory will call and confirm whether she wants to proceed with testing.  If the out of pocket cost of testing is less than $100 she will be billed by the genetic testing laboratory.   PLAN: After considering the risks, benefits, and limitations, Deanna Johnston provided informed consent to pursue genetic testing and the blood sample was sent to [facility] for analysis of the CancerNext Panel. Results should be available within approximately 2-3 weeks' time, at which point they will be disclosed by telephone to Deanna Johnston, as will any additional recommendations warranted by these results. Ms.  Johnston will receive a summary of her genetic counseling visit and a copy of her results once available. This information will also be available in Epic.   Deanna Johnston's questions were answered to her satisfaction today. Our contact information was provided should additional questions or concerns arise. Thank you for the referral and allowing [place] to share in the care of your patient.   Lucille Passy, MS, [facility] Genetic Counselor [email or web address] (P) [phone]  The patient was seen for a total of 20 minutes in face-to-face genetic counseling.  The patient brought her niece. Drs. Lindi Adie and/or Burr Medico were available to discuss this case as needed.   _______________________________________________________________________ For Office Staff:  Number of people involved in session: 2 Was an Intern/ student involved with case: no

## 2021-03-14 ENCOUNTER — Other Ambulatory Visit: Payer: Self-pay | Admitting: Surgery

## 2021-03-14 ENCOUNTER — Encounter: Payer: Self-pay | Admitting: *Deleted

## 2021-03-14 DIAGNOSIS — C50212 Malignant neoplasm of upper-inner quadrant of left female breast: Secondary | ICD-10-CM

## 2021-03-14 DIAGNOSIS — Z853 Personal history of malignant neoplasm of breast: Secondary | ICD-10-CM

## 2021-03-16 ENCOUNTER — Telehealth: Payer: Self-pay | Admitting: Hematology and Oncology

## 2021-03-16 ENCOUNTER — Other Ambulatory Visit: Payer: Medicare Other

## 2021-03-16 HISTORY — PX: BREAST LUMPECTOMY: SHX2

## 2021-03-16 NOTE — Telephone Encounter (Signed)
Sch per 1/30 inbasket, pt aware

## 2021-03-17 ENCOUNTER — Telehealth: Payer: Self-pay | Admitting: *Deleted

## 2021-03-17 ENCOUNTER — Encounter: Payer: Self-pay | Admitting: *Deleted

## 2021-03-17 NOTE — Telephone Encounter (Signed)
Spoke with patient to follow up from [facility] 1/25 and assess navigation needs.  Patient denies any questions or concerns at this time. Encouraged patient to call should anything arise. Patient verbalized understanding.

## 2021-03-18 ENCOUNTER — Telehealth: Payer: Self-pay

## 2021-03-18 NOTE — Telephone Encounter (Signed)
° °  Pre-operative Risk Assessment    Patient Name: Deanna Johnston  DOB: 05-Apr-1945 MRN: [id number]      Request for Surgical Clearance    Procedure:   LEFT BREAST LUMPECTOMY  Date of Surgery:  Clearance 04/06/21                                 Surgeon:  DR. Coralie Keens Surgeon's Group or Practice Name:  [facility] Phone number:  [phone] Fax number:  [phone] ATTN: Malachi Bonds, CMA   Type of Clearance Requested:   - Medical  - Pharmacy:  Hold Aspirin     Type of Anesthesia:  General    Additional requests/questions:    SignedJacinta Shoe   03/18/2021, 1:38 PM

## 2021-03-18 NOTE — Telephone Encounter (Signed)
° °  Name: Deanna Johnston  DOB: 1945/06/07  MRN: [id number]   Primary Cardiologist: None  Chart reviewed as part of pre-operative protocol coverage. Patient was contacted 03/18/2021 in reference to pre-operative risk assessment for pending surgery as outlined below.  AZAYA GOEDDE was last seen on 10/26/20 by Dr. Jeffie Pollock.  Since that day, TAMECIA MCDOUGALD has done well from a cardiac perspective.  Regarding ASA therapy, we recommend continuation of ASA throughout the perioperative period.  However, if the surgeon feels that cessation of ASA is required in the perioperative period, it may be stopped 5-7 days prior to surgery with a plan to resume it as soon as felt to be feasible from a surgical standpoint in the post-operative period.   Therefore, based on ACC/AHA guidelines, the patient would be at acceptable risk for the planned procedure without further cardiovascular testing.   The patient was advised that if she develops new symptoms prior to surgery to contact our office to arrange for a follow-up visit, and she verbalized understanding.  I will route this recommendation to the requesting party via Epic fax function and remove from pre-op pool. Please call with questions.  Britanni Yarde Ninfa Meeker, PA-C 03/18/2021, 3:16 PM

## 2021-03-21 ENCOUNTER — Encounter: Payer: Self-pay | Admitting: Genetic Counselor

## 2021-03-21 ENCOUNTER — Telehealth: Payer: Self-pay | Admitting: Genetic Counselor

## 2021-03-21 DIAGNOSIS — Z1379 Encounter for other screening for genetic and chromosomal anomalies: Secondary | ICD-10-CM | POA: Insufficient documentation

## 2021-03-21 NOTE — Telephone Encounter (Signed)
I contacted Ms. Mewborn to discuss her genetic testing results. No pathogenic variants were identified in the first 8 genes analyzed. We are waiting on additional gene analysis and will call her when they are available. Detailed clinic note to follow.  The test report has been scanned into EPIC and is located under the Molecular Pathology section of the Results Review tab.  A portion of the result report is included below for reference.   Lucille Passy, MS, [facility] Genetic Counselor [email or web address] (P) [phone]

## 2021-03-27 ENCOUNTER — Other Ambulatory Visit (HOSPITAL_COMMUNITY): Payer: Self-pay | Admitting: Internal Medicine

## 2021-03-28 NOTE — Progress Notes (Signed)
Surgical Instructions    Your procedure is scheduled on 04/06/21.  Report to [facility] Main Entrance "A" at 6:30 A.M., then check in with the Admitting office.  Call this number if you have problems the morning of surgery:  [phone]   If you have any questions prior to your surgery date call [phone]: Open Monday-Friday 8am-4pm    Remember:  Do not eat after midnight the night before your surgery  You may drink clear liquids until 5:30 the morning of your surgery.   Clear liquids allowed are: Water, Non-Citrus Juices (without pulp), Carbonated Beverages, Clear Tea, Black Coffee ONLY (NO MILK, CREAM OR POWDERED CREAMER of any kind), and Gatorade  Please complete your PRE-SURGERY ENSURE that was provided to you by 5:30 the morning of surgery.  Please, if able, drink it in one setting. DO NOT SIP.     Take these medicines the morning of surgery with A SIP OF WATER:  NP THYROID nitroGLYCERIN (NITROSTAT) - if needed   As of today, STOP taking any Aspirin (unless otherwise instructed by your surgeon) Aleve, Naproxen, Ibuprofen, Motrin, Advil, Goody's, BC's, all herbal medications, fish oil, and all vitamins.           Do not wear jewelry or makeup Do not wear lotions, powders, perfumes or deodorant. Do not shave 48 hours prior to surgery.   Do not bring valuables to the hospital. Do not wear nail polish, gel polish, artificial nails, or any other type of covering on natural nails (fingers and toes) If you have artificial nails or gel coating that need to be removed by a nail salon, please have this removed prior to surgery. Artificial nails or gel coating may interfere with anesthesia's ability to adequately monitor your vital signs.  [facility] is not responsible for any belongings or valuables. .   Do NOT Smoke (Tobacco/Vaping)  24 hours prior to your procedure  If you use a CPAP at night, you may bring your mask for your overnight stay.   Contacts, glasses, hearing aids,  dentures or partials may not be worn into surgery, please bring cases for these belongings   For patients admitted to the hospital, discharge time will be determined by your treatment team.   Patients discharged the day of surgery will not be allowed to drive home, and someone needs to stay with them for 24 hours.  NO VISITORS WILL BE ALLOWED IN PRE-OP WHERE PATIENTS ARE PREPPED FOR SURGERY.  ONLY 1 SUPPORT PERSON MAY BE PRESENT IN THE WAITING ROOM WHILE YOU ARE IN SURGERY.  IF YOU ARE TO BE ADMITTED, ONCE YOU ARE IN YOUR ROOM YOU WILL BE ALLOWED TWO (2) VISITORS. 1 (ONE) VISITOR MAY STAY OVERNIGHT BUT MUST ARRIVE TO THE ROOM BY 8pm.  Minor children may have two parents present. Special consideration for safety and communication needs will be reviewed on a case by case basis.  Special instructions:    Oral Hygiene is also important to reduce your risk of infection.  Remember - BRUSH YOUR TEETH THE MORNING OF SURGERY WITH YOUR REGULAR TOOTHPASTE   [facility]- Preparing For Surgery  Before surgery, you can play an important role. Because skin is not sterile, your skin needs to be as free of germs as possible. You can reduce the number of germs on your skin by washing with CHG (chlorahexidine gluconate) Soap before surgery.  CHG is an antiseptic cleaner which kills germs and bonds with the skin to continue killing germs even after washing.  Please do not use if you have an allergy to CHG or antibacterial soaps. If your skin becomes reddened/irritated stop using the CHG.  Do not shave (including legs and underarms) for at least 48 hours prior to first CHG shower. It is OK to shave your face.  Please follow these instructions carefully.     Shower the NIGHT BEFORE SURGERY and the MORNING OF SURGERY with CHG Soap.   If you chose to wash your hair, wash your hair first as usual with your normal shampoo. After you shampoo, rinse your hair and body thoroughly to remove the shampoo.  Then [employer] and genitals (private parts) with your normal soap and rinse thoroughly to remove soap.  After that Use CHG Soap as you would any other liquid soap. You can apply CHG directly to the skin and wash gently with a scrungie or a clean washcloth.   Apply the CHG Soap to your body ONLY FROM THE NECK DOWN.  Do not use on open wounds or open sores. Avoid contact with your eyes, ears, mouth and genitals (private parts). Wash Face and genitals (private parts)  with your normal soap.   Wash thoroughly, paying special attention to the area where your surgery will be performed.  Thoroughly rinse your body with warm water from the neck down.  DO NOT shower/wash with your normal soap after using and rinsing off the CHG Soap.  Pat yourself dry with a CLEAN TOWEL.  Wear CLEAN PAJAMAS to bed the night before surgery  Place CLEAN SHEETS on your bed the night before your surgery  DO NOT SLEEP WITH PETS.   Day of Surgery:  Take a shower with CHG soap. Wear Clean/Comfortable clothing the morning of surgery Do not apply any deodorants/lotions.   Remember to brush your teeth WITH YOUR REGULAR TOOTHPASTE.    COVID testing  If you are going to stay overnight or be admitted after your procedure/surgery and require a pre-op COVID test, please follow these instructions after your COVID test   You are not required to quarantine however you are required to wear a well-fitting mask when you are out and around people not in your household.  If your mask becomes wet or soiled, replace with a new one.  Wash your hands often with soap and water for 20 seconds or clean your hands with an alcohol-based hand sanitizer that contains at least 60% alcohol.  Do not share personal items.  Notify your provider: if you are in close contact with someone who has COVID  or if you develop a fever of 100.4 or greater, sneezing, cough, sore throat, shortness of breath or body aches.    Please read over the following  fact sheets that you were given.

## 2021-03-29 ENCOUNTER — Encounter (HOSPITAL_COMMUNITY)
Admission: RE | Admit: 2021-03-29 | Discharge: 2021-03-29 | Disposition: A | Discharge status: Home / Self Care | Payer: Medicare Other | Source: Ambulatory Visit | Attending: Surgery | Admitting: Surgery

## 2021-03-29 ENCOUNTER — Other Ambulatory Visit: Payer: Self-pay

## 2021-03-29 ENCOUNTER — Encounter (HOSPITAL_COMMUNITY): Payer: Self-pay

## 2021-03-29 DIAGNOSIS — Z91199 Patient's noncompliance with other medical treatment and regimen due to unspecified reason: Secondary | ICD-10-CM | POA: Insufficient documentation

## 2021-03-29 DIAGNOSIS — I2510 Atherosclerotic heart disease of native coronary artery without angina pectoris: Secondary | ICD-10-CM | POA: Diagnosis not present

## 2021-03-29 DIAGNOSIS — J449 Chronic obstructive pulmonary disease, unspecified: Secondary | ICD-10-CM | POA: Diagnosis not present

## 2021-03-29 DIAGNOSIS — Z9981 Dependence on supplemental oxygen: Secondary | ICD-10-CM | POA: Insufficient documentation

## 2021-03-29 DIAGNOSIS — Z01818 Encounter for other preprocedural examination: Secondary | ICD-10-CM | POA: Diagnosis not present

## 2021-03-29 DIAGNOSIS — I252 Old myocardial infarction: Secondary | ICD-10-CM | POA: Diagnosis not present

## 2021-03-29 DIAGNOSIS — J9611 Chronic respiratory failure with hypoxia: Secondary | ICD-10-CM | POA: Diagnosis not present

## 2021-03-29 DIAGNOSIS — Z955 Presence of coronary angioplasty implant and graft: Secondary | ICD-10-CM | POA: Diagnosis not present

## 2021-03-29 DIAGNOSIS — Z8701 Personal history of pneumonia (recurrent): Secondary | ICD-10-CM | POA: Diagnosis not present

## 2021-03-29 DIAGNOSIS — Z7982 Long term (current) use of aspirin: Secondary | ICD-10-CM | POA: Diagnosis not present

## 2021-03-29 HISTORY — DX: Dyspnea, unspecified: R06.00

## 2021-03-29 HISTORY — DX: Malignant (primary) neoplasm, unspecified: C80.1

## 2021-03-29 NOTE — Progress Notes (Signed)
PCP - Dr. Kelton Pillar Cardiologist - Dr. Jeffie Pollock  PPM/ICD - n/a Device Orders - n/a Rep Notified - n/a  Chest x-ray - n/a EKG - 03/29/21 Stress Test - denies ECHO - 10/26/20 Cardiac Cath - 07/26/16  Sleep Study - 2018 per patient. Normal per patient.  CPAP - N/a  Fasting Blood Sugar - n/a Checks Blood Sugar _____ times a day- n/a  Blood Thinner Instructions: n/a Aspirin Instructions: As of today stop taking Aspirin unless otherwise instructed by your surgeon. Per cardiology note on 03/18/21 patient may stop Aspirin 5-7 days prior to surgery. Patient states she will take last dose on Wednesday 03/30/21.   ERAS Protcol - Yes PRE-SURGERY Ensure or G2- Ensure  COVID TEST- Ambulatory Surgery.    Anesthesia review: Yes. Cardiac history. Clearance note 03/18/21. EKG review.   Patient denies shortness of breath, fever, cough and chest pain at PAT appointment   All instructions explained to the patient, with a verbal understanding of the material. Patient agrees to go over the instructions while at home for a better understanding. Patient also instructed to self quarantine after being tested for COVID-19. The opportunity to ask questions was provided.

## 2021-03-29 NOTE — Progress Notes (Signed)
Surgical Instructions    Your procedure is scheduled on 04/06/21.  Report to [facility] Main Entrance "A" at 6:30 A.M., then check in with the Admitting office.  Call this number if you have problems the morning of surgery:  [phone]   If you have any questions prior to your surgery date call [phone]: Open Monday-Friday 8am-4pm    Remember:  Do not eat after midnight the night before your surgery  You may drink clear liquids until 5:30 the morning of your surgery.   Clear liquids allowed are: Water, Non-Citrus Juices (without pulp), Carbonated Beverages, Clear Tea, Black Coffee ONLY (NO MILK, CREAM OR POWDERED CREAMER of any kind), and Gatorade  Patient Instructions  The night before surgery:  No food after midnight. ONLY clear liquids after midnight  The day of surgery (if you do NOT have diabetes):  Drink ONE (1) Pre-Surgery Clear Ensure by 05:30 A.M. the morning of surgery. Drink in one sitting. Do not sip.  This drink was given to you during your hospital  pre-op appointment visit.  Nothing else to drink after completing the  Pre-Surgery Clear Ensure.         If you have questions, please contact your surgeons office.    Take these medicines the morning of surgery with A SIP OF WATER:   NP THYROID nitroGLYCERIN (NITROSTAT) - if needed albuterol (PROAIR HFA) 108 (90 Base)- If needed  As of today, STOP taking any Aspirin (unless otherwise instructed by your surgeon) Aleve, Naproxen, Ibuprofen, Motrin, Advil, Goody's, BC's, all herbal medications, fish oil, and all vitamins.           Do not wear jewelry or makeup Do not wear lotions, powders, perfumes or deodorant. Do not shave 48 hours prior to surgery.   Do not bring valuables to the hospital. Do not wear nail polish, gel polish, artificial nails, or any other type of covering on natural nails (fingers and toes) If you have artificial nails or gel coating that need to be removed by a nail salon, please have  this removed prior to surgery. Artificial nails or gel coating may interfere with anesthesia's ability to adequately monitor your vital signs.  [place] is not responsible for any belongings or valuables. .   Do NOT Smoke (Tobacco/Vaping)  24 hours prior to your procedure  If you use a CPAP at night, you may bring your mask for your overnight stay.   Contacts, glasses, hearing aids, dentures or partials may not be worn into surgery, please bring cases for these belongings   For patients admitted to the hospital, discharge time will be determined by your treatment team.   Patients discharged the day of surgery will not be allowed to drive home, and someone needs to stay with them for 24 hours.  NO VISITORS WILL BE ALLOWED IN PRE-OP WHERE PATIENTS ARE PREPPED FOR SURGERY.  ONLY 1 SUPPORT PERSON MAY BE PRESENT IN THE WAITING ROOM WHILE YOU ARE IN SURGERY.  IF YOU ARE TO BE ADMITTED, ONCE YOU ARE IN YOUR ROOM YOU WILL BE ALLOWED TWO (2) VISITORS. 1 (ONE) VISITOR MAY STAY OVERNIGHT BUT MUST ARRIVE TO THE ROOM BY 8pm.  Minor children may have two parents present. Special consideration for safety and communication needs will be reviewed on a case by case basis.  Special instructions:    Oral Hygiene is also important to reduce your risk of infection.  Remember - BRUSH YOUR TEETH THE MORNING OF SURGERY WITH YOUR REGULAR TOOTHPASTE  [place]- Preparing For Surgery  Before surgery, you can play an important role. Because skin is not sterile, your skin needs to be as free of germs as possible. You can reduce the number of germs on your skin by washing with CHG (chlorahexidine gluconate) Soap before surgery.  CHG is an antiseptic cleaner which kills germs and bonds with the skin to continue killing germs even after washing.     Please do not use if you have an allergy to CHG or antibacterial soaps. If your skin becomes reddened/irritated stop using the CHG.  Do not shave (including legs and  underarms) for at least 48 hours prior to first CHG shower. It is OK to shave your face.  Please follow these instructions carefully.     Shower the NIGHT BEFORE SURGERY and the MORNING OF SURGERY with CHG Soap.   If you chose to wash your hair, wash your hair first as usual with your normal shampoo. After you shampoo, rinse your hair and body thoroughly to remove the shampoo.  Then [employer] and genitals (private parts) with your normal soap and rinse thoroughly to remove soap.  After that Use CHG Soap as you would any other liquid soap. You can apply CHG directly to the skin and wash gently with a scrungie or a clean washcloth.   Apply the CHG Soap to your body ONLY FROM THE NECK DOWN.  Do not use on open wounds or open sores. Avoid contact with your eyes, ears, mouth and genitals (private parts). Wash Face and genitals (private parts)  with your normal soap.   Wash thoroughly, paying special attention to the area where your surgery will be performed.  Thoroughly rinse your body with warm water from the neck down.  DO NOT shower/wash with your normal soap after using and rinsing off the CHG Soap.  Pat yourself dry with a CLEAN TOWEL.  Wear CLEAN PAJAMAS to bed the night before surgery  Place CLEAN SHEETS on your bed the night before your surgery  DO NOT SLEEP WITH PETS.   Day of Surgery:  Take a shower with CHG soap. Wear Clean/Comfortable clothing the morning of surgery Do not apply any deodorants/lotions.   Remember to brush your teeth WITH YOUR REGULAR TOOTHPASTE.    COVID testing  If you are going to stay overnight or be admitted after your procedure/surgery and require a pre-op COVID test, please follow these instructions after your COVID test   You are not required to quarantine however you are required to wear a well-fitting mask when you are out and around people not in your household.  If your mask becomes wet or soiled, replace with a new one.  Wash your hands  often with soap and water for 20 seconds or clean your hands with an alcohol-based hand sanitizer that contains at least 60% alcohol.  Do not share personal items.  Notify your provider: if you are in close contact with someone who has COVID  or if you develop a fever of 100.4 or greater, sneezing, cough, sore throat, shortness of breath or body aches.    Please read over the following fact sheets that you were given.

## 2021-03-30 NOTE — Anesthesia Preprocedure Evaluation (Addendum)
Anesthesia Evaluation  Patient identified by MRN, date of birth, ID band Patient awake    Reviewed: Allergy & Precautions, NPO status , Patient's Chart, lab work & pertinent test results  History of Anesthesia Complications Negative for: history of anesthetic complications  Airway Mallampati: II  TM Distance: >3 FB Neck ROM: Full    Dental  (+) Missing, Dental Advisory Given   Pulmonary COPD,  oxygen dependent, former smoker,    Pulmonary exam normal        Cardiovascular hypertension, pulmonary hypertension+ CAD, + Past MI (2011), + Cardiac Stents (2011) and +CHF  Normal cardiovascular exam   TTE 10/26/20: EF 65-70%, no RWMA, g1dd, nl RV size/fn, mild LAE/RAE, valves unremarkable   Neuro/Psych negative neurological ROS     GI/Hepatic negative GI ROS, Neg liver ROS,   Endo/Other  negative endocrine ROS  Renal/GU negative Renal ROS  negative genitourinary   Musculoskeletal negative musculoskeletal ROS (+)   Abdominal   Peds  Hematology negative hematology ROS (+)   Anesthesia Other Findings  Breast cancer  Reproductive/Obstetrics                          Anesthesia Physical Anesthesia Plan  ASA: 3  Anesthesia Plan: General   Post-op Pain Management: Regional block* and Tylenol PO (pre-op)*   Induction: Intravenous  PONV Risk Score and Plan: 3 and Ondansetron, Dexamethasone, Midazolam and Treatment may vary due to age or medical condition  Airway Management Planned: LMA  Additional Equipment: None  Intra-op Plan:   Post-operative Plan: Extubation in OR  Informed Consent: I have reviewed the patients History and Physical, chart, labs and discussed the procedure including the risks, benefits and alternatives for the proposed anesthesia with the patient or authorized representative who has indicated his/her understanding and acceptance.     Dental advisory given  Plan Discussed  with:   Anesthesia Plan Comments: (PAT note by Karoline Caldwell, PA-C: Follows with cardiology for history of CAD s/p NSTEMI 2011 treated with stents to RCA.  Cath 2018 with nonobstructive CAD.  Admitted to [facility] on 07/20/16 with PNA and severe cor pulmonale. Echo 07/21/16: LV EF 65-70% with dilated RV and moderate RV HK. Started on dopamine for RV support. CT chest no PE. Diuresed well and renal function improved. Cath with mild PAH after optimization.  Echo 10/26/20 showed EF 65 to 70% with normal RV function.  Last seen by Dr. Haroldine Laws 10/26/2020 and noted to be stable NYHA III at that time.  Cardiac clearance per telephone encounter 03/18/2021, "Chart reviewed as part of pre-operative protocol coverage. Patient was contacted2/3/2023in reference to pre-operative risk assessment for pending surgery as outlined below. Alandra Sando Hopkinswas last seen on 9/13/22by Dr. Jeffie Pollock. Since that day, DESHAWN SKELLEY done well from a cardiac perspective. Regarding ASA therapy, we recommend continuation of ASA throughout the perioperative period. However, if the surgeon feels that cessation of ASA is required in the perioperative period, it may be stopped 5-7 days prior to surgery with a plan to resume it as soon as felt to be feasible from a surgical standpoint in the post-operative period. Therefore, based on ACC/AHA guidelines, the patient would be at acceptable risk for the planned procedure without further cardiovascular testing."  Follows with pulmonology for history of COPD Gold 2, chronic respiratory failure with hypoxia (on 2 L O2 nightly and with activity).  History of noncompliance with inhalers.  Last seen by Dr. Melvyn Novas 03/01/2021 and advised to  follow-up with pulmonary on an as-needed basis.    Preop labs reviewed, unremarkable.  EKG 03/29/2021: Normal sinus rhythm.  Rate 93. Possible Left atrial enlargement. Possible Inferior infarct , age undetermined. Possible Anterior infarct , age undetermined.  No  significant change from last tracing.  TTE 10/26/2020: 1. Left ventricular ejection fraction, by estimation, is 65 to 70%. The  left ventricle has normal function. The left ventricle has no regional  wall motion abnormalities. Left ventricular diastolic parameters are  consistent with Grade I diastolic  dysfunction (impaired relaxation).  2. Right ventricular systolic function is normal. The right ventricular  size is normal.  3. Left atrial size was mildly dilated.  4. Right atrial size was mildly dilated.  5. The mitral valve is normal in structure. Trivial mitral valve  regurgitation. No evidence of mitral stenosis.  6. The aortic valve was not well visualized. There is mild calcification  of the aortic valve. Aortic valve regurgitation is not visualized. No  aortic stenosis is present.  7. The inferior vena cava is normal in size with greater than 50%  respiratory variability, suggesting right atrial pressure of 3 mmHg.  Cath 07/26/2016: Assessment: 1. Heavily calcified coronary arteries with only minimal luminal CAD. Widely patent RCA stent 2. LVEF 70% without regional wall motion abnormalities 3. Mild PAH with normal cardiac output on low-dose dopamine 4. No intracardiac shunt 4. PAD - central aortic pressure ~88mmHG greater than radial artery pressure or cuff pressure  Plan/Discussion:  Hemodynamics much better than expected. She is now dry. Stop diuretics. Wean dopamine to off.  PFTS 09/2016 FEV1 1.2 (74%) FVC 1.9 87%)  FEF 25-75 45% DLCO 58% Moderate obstruction with moderate to severe diffusion defect.   )      Anesthesia Quick Evaluation

## 2021-03-30 NOTE — Progress Notes (Signed)
Anesthesia Chart Review:  Follows with cardiology for history of CAD s/p NSTEMI 2011 treated with stents to RCA.  Cath 2018 with nonobstructive CAD.  Admitted to [facility] on 07/20/16 with PNA and severe cor pulmonale. Echo 07/21/16: LV EF 65-70% with dilated RV and moderate RV HK. Started on dopamine for RV support. CT chest no PE. Diuresed well and renal function improved. Cath with mild PAH after optimization.  Echo 10/26/20 showed EF 65 to 70% with normal RV function.  Last seen by Dr. Haroldine Laws 10/26/2020 and noted to be stable NYHA III at that time.  Cardiac clearance per telephone encounter 03/18/2021, "Chart reviewed as part of pre-operative protocol coverage. Patient was contacted 03/18/2021 in reference to pre-operative risk assessment for pending surgery as outlined below.  Deanna Johnston was last seen on 10/26/20 by Dr. Jeffie Pollock.  Since that day, Deanna Johnston has done well from a cardiac perspective. Regarding ASA therapy, we recommend continuation of ASA throughout the perioperative period.  However, if the surgeon feels that cessation of ASA is required in the perioperative period, it may be stopped 5-7 days prior to surgery with a plan to resume it as soon as felt to be feasible from a surgical standpoint in the post-operative period. Therefore, based on ACC/AHA guidelines, the patient would be at acceptable risk for the planned procedure without further cardiovascular testing."   Follows with pulmonology for history of COPD Gold 2, chronic respiratory failure with hypoxia (on 2 L O2 nightly and with activity).  History of noncompliance with inhalers.  Last seen by Dr. Melvyn Novas 03/01/2021 and advised to follow-up with pulmonary on an as-needed basis.    Preop labs reviewed, unremarkable.  EKG 03/29/2021: Normal sinus rhythm.  Rate 93. Possible Left atrial enlargement. Possible Inferior infarct , age undetermined. Possible Anterior infarct , age undetermined.  No significant change from last  tracing.  TTE 10/26/2020:  1. Left ventricular ejection fraction, by estimation, is 65 to 70%. The  left ventricle has normal function. The left ventricle has no regional  wall motion abnormalities. Left ventricular diastolic parameters are  consistent with Grade I diastolic  dysfunction (impaired relaxation).   2. Right ventricular systolic function is normal. The right ventricular  size is normal.   3. Left atrial size was mildly dilated.   4. Right atrial size was mildly dilated.   5. The mitral valve is normal in structure. Trivial mitral valve  regurgitation. No evidence of mitral stenosis.   6. The aortic valve was not well visualized. There is mild calcification  of the aortic valve. Aortic valve regurgitation is not visualized. No  aortic stenosis is present.   7. The inferior vena cava is normal in size with greater than 50%  respiratory variability, suggesting right atrial pressure of 3 mmHg.  Cath 07/26/2016: Assessment: 1. Heavily calcified coronary arteries with only minimal luminal CAD. Widely patent RCA stent 2. LVEF 70% without regional wall motion abnormalities 3. Mild PAH with normal cardiac output on low-dose dopamine 4. No intracardiac shunt 4. PAD - central aortic pressure ~88mmHG greater than radial artery pressure or cuff pressure   Plan/Discussion:   Hemodynamics much better than expected. She is now dry. Stop diuretics. Wean dopamine to off.  PFTS 09/2016 FEV1  1.2 (74%) FVC   1.9 87%)  FEF 25-75 45% DLCO 58% Moderate obstruction with moderate to severe diffusion defect.    Wynonia Musty [facility] Short Stay Center/Anesthesiology Phone [phone] 03/30/2021 4:33 PM

## 2021-04-05 ENCOUNTER — Ambulatory Visit
Admission: RE | Admit: 2021-04-05 | Discharge: 2021-04-05 | Disposition: A | Discharge status: Home / Self Care | Payer: Medicare Other | Source: Ambulatory Visit | Attending: Surgery | Admitting: Surgery

## 2021-04-05 ENCOUNTER — Other Ambulatory Visit: Payer: Self-pay | Admitting: Surgery

## 2021-04-05 DIAGNOSIS — C50212 Malignant neoplasm of upper-inner quadrant of left female breast: Secondary | ICD-10-CM | POA: Diagnosis not present

## 2021-04-05 DIAGNOSIS — Z853 Personal history of malignant neoplasm of breast: Secondary | ICD-10-CM

## 2021-04-05 DIAGNOSIS — R928 Other abnormal and inconclusive findings on diagnostic imaging of breast: Secondary | ICD-10-CM | POA: Diagnosis not present

## 2021-04-05 DIAGNOSIS — N6320 Unspecified lump in the left breast, unspecified quadrant: Secondary | ICD-10-CM

## 2021-04-05 NOTE — H&P (Signed)
PROVIDER: Beverlee Nims, MD  MRN: [id number] DOB: 08/21/1945  Subjective   Chief Complaint: Breast Cancer   History of Present Illness: Deanna Johnston is a 76 y.o. female who is seen as an office consult for evaluation of Breast Cancer .   Is a 76 year old female with a history of COPD on 2 L of oxygen at home who is found on Mammography to have 2 separate masses in the left breast. They were adjacent to each other. One measured 1.7 cm and the other was 4 mm. The larger mass was biopsied showing invasive ductal carcinoma. The smaller mass was benign but found to be discordant. Removal both areas is recommended. She has had no previous history of breast cancer. She does ambulate with a walker. There is no family history of breast cancer. She denies nipple discharge. She does seem to get short of breath fairly easily.  Review of Systems: A complete review of systems was obtained from the patient. I have reviewed this information and discussed as appropriate with the patient. See HPI as well for other ROS.  ROS   Medical History: Past Medical History:  Diagnosis Date   COPD (chronic obstructive pulmonary disease) (CMS-HCC)   Hyperlipidemia   There is no problem list on file for this patient.  Past Surgical History:  Procedure Laterality Date   HYSTERECTOMY   TREATMENT FISTULA ANAL    Allergies  Allergen Reactions   Atorvastatin Unknown  myalgias   Prednisone Other (See Comments)  "just makes me sick"   Rosuvastatin Unknown  intolerant   Sulfa (Sulfonamide Antibiotics) Unknown   Current Outpatient Medications on File Prior to Visit  Medication Sig Dispense Refill   albuterol 90 mcg/actuation inhaler albuterol sulfate HFA 90 mcg/actuation aerosol inhaler INHALE 2 PUFFS BY MOUTH EVERY 6 HOURS AS NEEDED   ascorbic acid, vitamin C, (VITAMIN C) 100 MG tablet Take 100 mg by mouth once daily   aspirin 81 MG EC tablet 1 tablet   FUROsemide (LASIX) 40 MG tablet  furosemide 40 mg tablet TAKE 1 TABLET BY MOUTH ONCE DAILY   ginger, Zingiber officinalis, 500 mg Cap Take by mouth   HONEY ORAL honey   losartan (COZAAR) 50 MG tablet Take 50 mg by mouth once daily   nitroGLYcerin (NITROSTAT) 0.4 MG SL tablet DISSOLVE ONE TABLET UNDER THE TONGUE EVERY 5 MINUTES AS NEEDED FOR CHEST PAIN.   NP THYROID 60 mg tablet TAKE 1 TABLET BY MOUTH ONCE DAILY ON AN EMPTY STOMACH   nystatin (MYCOSTATIN) 100,000 unit/gram cream APPLY 1 APPLICATION TOPICALLY TWICE DAILY   OXYGEN-AIR DELIVERY SYSTEMS MISC 2lpm with sleep  AHC   potassium chloride (KLOR-CON) 20 MEQ ER tablet   vitamin B complex (B COMPLEX 1 ORAL) 1 tab   No current facility-administered medications on file prior to visit.   Family History  Problem Relation Age of Onset   Myocardial Infarction (Heart attack) Mother   Diabetes Father   Myocardial Infarction (Heart attack) Father   Colon cancer Sister   Myocardial Infarction (Heart attack) Brother    Social History   Tobacco Use  Smoking Status Former   Types: Cigarettes   Quit date: 07/20/2016   Years since quitting: 4.6  Smokeless Tobacco Never    Social History   Socioeconomic History   Marital status: Unknown  Tobacco Use   Smoking status: Former  Types: Cigarettes  Quit date: 07/20/2016  Years since quitting: 4.6   Smokeless tobacco: Never  Substance and Sexual Activity  Alcohol use: Never   Drug use: Never     Physical Exam   She is alert on exam. She does get short of breath on long conversations. She ambulates with a walker.  There are no palpable breast masses. There is ecchymosis and small hematoma at the biopsy site. This is on the left breast. There is no axillary adenopathy.  Lungs clear CV RRR Abdomen soft, NT  Labs, Imaging and Diagnostic Testing: I reviewed her mammograms, ultrasound, pathology as well as  Assessment and Plan:    Invasive ductal carcinoma of breast, left (CMS-HCC)    We have discussed her  in our multi-disciplinary breast cancer conference this morning. She has an invasive left breast cancer. We discussed this in detail with the patient. From a surgical standpoint, a radioactive seed guided left breast lumpectomy and sentinel node biopsy is recommended. We discussed the reasons for this are in detail. I discussed the surgical procedure as well detail. She agrees to proceed with the lumpectomy but is still unsure about the sentinel node biopsy secondary to risk of edema in the arm. We discussed the risk of surgery which includes but is not limited to bleeding, infection, injury to surrounding structures, the need for further surgery if the margins are positive, seroma formation, arm swelling, etc. She will need cardiac clearance preoperatively. She agrees to proceed with surgery but again reports she may change her mind regarding sentinel node biopsy preoperatively.    Addendum: 2 seeds will be placed to remove the cancer and the smaller adjacent mass.  She again does not want a sentinel node biopsy.  We discussed the risk of the procedure as well as the risks of not having a node biopsy.  She understands and still wants to hold on any lymph node biopsy so we will just perform the radioactive seed guided left breast lumpectomy

## 2021-04-06 ENCOUNTER — Ambulatory Visit (HOSPITAL_COMMUNITY)
Admission: RE | Admit: 2021-04-06 | Discharge: 2021-04-06 | Disposition: A | Discharge status: Home / Self Care | Payer: Medicare Other | Attending: Surgery | Admitting: Surgery

## 2021-04-06 ENCOUNTER — Other Ambulatory Visit: Payer: Self-pay

## 2021-04-06 ENCOUNTER — Encounter (HOSPITAL_COMMUNITY)
Admission: RE | Disposition: A | Discharge status: Home / Self Care | Payer: Self-pay | Source: Home / Self Care | Attending: Surgery

## 2021-04-06 ENCOUNTER — Ambulatory Visit (HOSPITAL_BASED_OUTPATIENT_CLINIC_OR_DEPARTMENT_OTHER): Payer: Medicare Other | Admitting: Certified Registered Nurse Anesthetist

## 2021-04-06 ENCOUNTER — Ambulatory Visit
Admission: RE | Admit: 2021-04-06 | Discharge: 2021-04-06 | Disposition: A | Discharge status: Home / Self Care | Payer: Medicare Other | Source: Ambulatory Visit | Attending: Surgery | Admitting: Surgery

## 2021-04-06 ENCOUNTER — Encounter (HOSPITAL_COMMUNITY): Payer: Self-pay | Admitting: Surgery

## 2021-04-06 ENCOUNTER — Ambulatory Visit (HOSPITAL_COMMUNITY): Payer: Medicare Other | Admitting: Physician Assistant

## 2021-04-06 DIAGNOSIS — Z87891 Personal history of nicotine dependence: Secondary | ICD-10-CM | POA: Diagnosis not present

## 2021-04-06 DIAGNOSIS — I2510 Atherosclerotic heart disease of native coronary artery without angina pectoris: Secondary | ICD-10-CM | POA: Diagnosis not present

## 2021-04-06 DIAGNOSIS — I509 Heart failure, unspecified: Secondary | ICD-10-CM | POA: Insufficient documentation

## 2021-04-06 DIAGNOSIS — J449 Chronic obstructive pulmonary disease, unspecified: Secondary | ICD-10-CM | POA: Insufficient documentation

## 2021-04-06 DIAGNOSIS — I252 Old myocardial infarction: Secondary | ICD-10-CM | POA: Diagnosis not present

## 2021-04-06 DIAGNOSIS — I110 Hypertensive heart disease with heart failure: Secondary | ICD-10-CM | POA: Insufficient documentation

## 2021-04-06 DIAGNOSIS — C50912 Malignant neoplasm of unspecified site of left female breast: Secondary | ICD-10-CM | POA: Diagnosis not present

## 2021-04-06 DIAGNOSIS — Z9981 Dependence on supplemental oxygen: Secondary | ICD-10-CM | POA: Diagnosis not present

## 2021-04-06 DIAGNOSIS — Z955 Presence of coronary angioplasty implant and graft: Secondary | ICD-10-CM | POA: Diagnosis not present

## 2021-04-06 DIAGNOSIS — I10 Essential (primary) hypertension: Secondary | ICD-10-CM | POA: Diagnosis not present

## 2021-04-06 DIAGNOSIS — C50212 Malignant neoplasm of upper-inner quadrant of left female breast: Secondary | ICD-10-CM | POA: Diagnosis not present

## 2021-04-06 DIAGNOSIS — Z170 Estrogen receptor positive status [ER+]: Secondary | ICD-10-CM | POA: Insufficient documentation

## 2021-04-06 DIAGNOSIS — D242 Benign neoplasm of left breast: Secondary | ICD-10-CM | POA: Diagnosis not present

## 2021-04-06 DIAGNOSIS — N62 Hypertrophy of breast: Secondary | ICD-10-CM | POA: Diagnosis not present

## 2021-04-06 DIAGNOSIS — N6032 Fibrosclerosis of left breast: Secondary | ICD-10-CM | POA: Diagnosis not present

## 2021-04-06 DIAGNOSIS — Z853 Personal history of malignant neoplasm of breast: Secondary | ICD-10-CM

## 2021-04-06 DIAGNOSIS — N6022 Fibroadenosis of left breast: Secondary | ICD-10-CM | POA: Diagnosis not present

## 2021-04-06 DIAGNOSIS — R920 Mammographic microcalcification found on diagnostic imaging of breast: Secondary | ICD-10-CM | POA: Diagnosis not present

## 2021-04-06 HISTORY — PX: BREAST LUMPECTOMY WITH RADIOACTIVE SEED LOCALIZATION: SHX6424

## 2021-04-06 SURGERY — BREAST LUMPECTOMY WITH RADIOACTIVE SEED LOCALIZATION
Anesthesia: General | Site: Breast | Laterality: Left

## 2021-04-06 MED ORDER — FENTANYL CITRATE (PF) 250 MCG/5ML IJ SOLN
INTRAMUSCULAR | Status: AC
  Filled 2021-04-06: qty 5

## 2021-04-06 MED ORDER — DEXAMETHASONE SODIUM PHOSPHATE 10 MG/ML IJ SOLN
INTRAMUSCULAR | Status: AC
  Filled 2021-04-06: qty 1

## 2021-04-06 MED ORDER — PROPOFOL 10 MG/ML IV BOLUS
INTRAVENOUS | Status: DC | PRN
  Administered 2021-04-06: 130 mg via INTRAVENOUS

## 2021-04-06 MED ORDER — ENSURE PRE-SURGERY PO LIQD: 296.0000 mL | Freq: Once | ORAL | Status: DC

## 2021-04-06 MED ORDER — ACETAMINOPHEN 500 MG PO TABS
1000.0000 mg | ORAL_TABLET | ORAL | Status: AC
  Administered 2021-04-06: 1000 mg via ORAL
  Filled 2021-04-06: qty 2

## 2021-04-06 MED ORDER — DEXAMETHASONE SODIUM PHOSPHATE 10 MG/ML IJ SOLN
INTRAMUSCULAR | Status: DC | PRN
  Administered 2021-04-06: 5 mg via INTRAVENOUS

## 2021-04-06 MED ORDER — PROPOFOL 10 MG/ML IV BOLUS
INTRAVENOUS | Status: AC
  Filled 2021-04-06: qty 20

## 2021-04-06 MED ORDER — CEFAZOLIN SODIUM-DEXTROSE 2-4 GM/100ML-% IV SOLN
2.0000 g | INTRAVENOUS | Status: AC
  Administered 2021-04-06: 2 g via INTRAVENOUS
  Filled 2021-04-06: qty 100

## 2021-04-06 MED ORDER — CHLORHEXIDINE GLUCONATE CLOTH 2 % EX PADS: 6.0000 | MEDICATED_PAD | Freq: Once | CUTANEOUS | Status: DC

## 2021-04-06 MED ORDER — LABETALOL HCL 5 MG/ML IV SOLN
INTRAVENOUS | Status: AC
  Filled 2021-04-06: qty 4

## 2021-04-06 MED ORDER — ONDANSETRON HCL 4 MG/2ML IJ SOLN
INTRAMUSCULAR | Status: AC
  Filled 2021-04-06: qty 2

## 2021-04-06 MED ORDER — LABETALOL HCL 5 MG/ML IV SOLN: 10.0000 mg | Freq: Once | INTRAVENOUS | Status: DC

## 2021-04-06 MED ORDER — PHENYLEPHRINE 40 MCG/ML (10ML) SYRINGE FOR IV PUSH (FOR BLOOD PRESSURE SUPPORT)
PREFILLED_SYRINGE | INTRAVENOUS | Status: DC | PRN
  Administered 2021-04-06: 120 ug via INTRAVENOUS
  Administered 2021-04-06: 80 ug via INTRAVENOUS
  Administered 2021-04-06: 120 ug via INTRAVENOUS

## 2021-04-06 MED ORDER — ORAL CARE MOUTH RINSE: 15.0000 mL | Freq: Once | OROMUCOSAL | Status: AC

## 2021-04-06 MED ORDER — BUPIVACAINE-EPINEPHRINE 0.25% -1:200000 IJ SOLN
INTRAMUSCULAR | Status: DC | PRN
  Administered 2021-04-06: 20 mL

## 2021-04-06 MED ORDER — ONDANSETRON HCL 4 MG/2ML IJ SOLN
INTRAMUSCULAR | Status: DC | PRN
  Administered 2021-04-06: 4 mg via INTRAVENOUS

## 2021-04-06 MED ORDER — PHENYLEPHRINE 40 MCG/ML (10ML) SYRINGE FOR IV PUSH (FOR BLOOD PRESSURE SUPPORT)
PREFILLED_SYRINGE | INTRAVENOUS | Status: AC
  Filled 2021-04-06: qty 10

## 2021-04-06 MED ORDER — TRAMADOL HCL 50 MG PO TABS: 50.0000 mg | ORAL_TABLET | Freq: Four times a day (QID) | ORAL | 0 refills | Status: DC | PRN

## 2021-04-06 MED ORDER — PHENYLEPHRINE HCL-NACL 20-0.9 MG/250ML-% IV SOLN
INTRAVENOUS | Status: DC | PRN
  Administered 2021-04-06: 25 ug/min via INTRAVENOUS

## 2021-04-06 MED ORDER — LACTATED RINGERS IV SOLN: INTRAVENOUS | Status: DC

## 2021-04-06 MED ORDER — BUPIVACAINE-EPINEPHRINE (PF) 0.25% -1:200000 IJ SOLN
INTRAMUSCULAR | Status: AC
  Filled 2021-04-06: qty 30

## 2021-04-06 MED ORDER — CHLORHEXIDINE GLUCONATE 0.12 % MT SOLN
15.0000 mL | Freq: Once | OROMUCOSAL | Status: AC
  Administered 2021-04-06: 15 mL via OROMUCOSAL
  Filled 2021-04-06: qty 15

## 2021-04-06 MED ORDER — LIDOCAINE 2% (20 MG/ML) 5 ML SYRINGE
INTRAMUSCULAR | Status: AC
  Filled 2021-04-06: qty 5

## 2021-04-06 MED ORDER — LIDOCAINE 2% (20 MG/ML) 5 ML SYRINGE
INTRAMUSCULAR | Status: DC | PRN
  Administered 2021-04-06: 100 mg via INTRAVENOUS

## 2021-04-06 MED ORDER — FENTANYL CITRATE (PF) 250 MCG/5ML IJ SOLN
INTRAMUSCULAR | Status: DC | PRN
Start: 2021-04-06 — End: 2021-04-06
  Administered 2021-04-06: 50 ug via INTRAVENOUS
  Administered 2021-04-06: 25 ug via INTRAVENOUS

## 2021-04-06 SURGICAL SUPPLY — 32 items
APPLIER CLIP 9.375 MED OPEN (MISCELLANEOUS) ×6
BAG COUNTER SPONGE SURGICOUNT (BAG) ×3 IMPLANT
BINDER BREAST LRG (GAUZE/BANDAGES/DRESSINGS) IMPLANT
BINDER BREAST XLRG (GAUZE/BANDAGES/DRESSINGS) IMPLANT
CANISTER SUCT 3000ML PPV (MISCELLANEOUS) ×3 IMPLANT
CHLORAPREP W/TINT 26 (MISCELLANEOUS) ×3 IMPLANT
CLIP APPLIE 9.375 MED OPEN (MISCELLANEOUS) ×3 IMPLANT
COVER PROBE W GEL 5X96 (DRAPES) ×3 IMPLANT
COVER SURGICAL LIGHT HANDLE (MISCELLANEOUS) ×3 IMPLANT
DERMABOND ADVANCED (GAUZE/BANDAGES/DRESSINGS) ×1
DERMABOND ADVANCED .7 DNX12 (GAUZE/BANDAGES/DRESSINGS) ×2 IMPLANT
DEVICE DUBIN SPECIMEN MAMMOGRA (MISCELLANEOUS) ×3 IMPLANT
DRAPE CHEST BREAST 15X10 FENES (DRAPES) ×3 IMPLANT
ELECT CAUTERY BLADE 6.4 (BLADE) ×3 IMPLANT
ELECT REM PT RETURN 9FT ADLT (ELECTROSURGICAL) ×3
ELECTRODE REM PT RTRN 9FT ADLT (ELECTROSURGICAL) ×2 IMPLANT
GLOVE SURG SIGNA 7.5 PF LTX (GLOVE) ×3 IMPLANT
GOWN STRL REUS W/ TWL LRG LVL3 (GOWN DISPOSABLE) ×2 IMPLANT
GOWN STRL REUS W/ TWL XL LVL3 (GOWN DISPOSABLE) ×2 IMPLANT
GOWN STRL REUS W/TWL LRG LVL3 (GOWN DISPOSABLE) ×3
GOWN STRL REUS W/TWL XL LVL3 (GOWN DISPOSABLE) ×3
KIT BASIN OR (CUSTOM PROCEDURE TRAY) ×3 IMPLANT
KIT MARKER MARGIN INK (KITS) ×3 IMPLANT
NDL HYPO 25GX1X1/2 BEV (NEEDLE) ×1 IMPLANT
NEEDLE HYPO 25GX1X1/2 BEV (NEEDLE) ×3 IMPLANT
NS IRRIG 1000ML POUR BTL (IV SOLUTION) IMPLANT
PACK GENERAL/GYN (CUSTOM PROCEDURE TRAY) ×3 IMPLANT
SUT MNCRL AB 4-0 PS2 18 (SUTURE) ×3 IMPLANT
SUT VIC AB 3-0 SH 18 (SUTURE) ×3 IMPLANT
SYR CONTROL 10ML LL (SYRINGE) ×3 IMPLANT
TOWEL GREEN STERILE (TOWEL DISPOSABLE) ×3 IMPLANT
TOWEL GREEN STERILE FF (TOWEL DISPOSABLE) ×3 IMPLANT

## 2021-04-06 NOTE — Anesthesia Procedure Notes (Signed)
Procedure Name: LMA Insertion Date/Time: 04/06/2021 7:30 AM Performed by: Colin Benton, CRNA Pre-anesthesia Checklist: Patient identified, Emergency Drugs available, Suction available and Patient being monitored Patient Re-evaluated:Patient Re-evaluated prior to induction Oxygen Delivery Method: Circle System Utilized Preoxygenation: Pre-oxygenation with 100% oxygen Induction Type: IV induction Ventilation: Mask ventilation without difficulty LMA: LMA inserted LMA Size: 4.0 Number of attempts: 1 Placement Confirmation: positive ETCO2 Tube secured with: Tape Dental Injury: Teeth and Oropharynx as per pre-operative assessment

## 2021-04-06 NOTE — Op Note (Signed)
LEFT BREAST LUMPECTOMY WITH RADIOACTIVE SEED X2 LOCALIZATION  Procedure Note  Deanna Johnston 04/06/2021   Pre-op Diagnosis: LEFT BREAST CANCER     Post-op Diagnosis: same  Procedure(s): LEFT BREAST LUMPECTOMY WITH RADIOACTIVE SEED X2 LOCALIZATION  Surgeon(s): Coralie Keens, MD  Anesthesia: General  Staff:  Circulator: Maurene Capes, RN; Merry Proud, RN Scrub Person: Verlene Mayer Circulator Assistant: Kandra Nicolas, RN  Estimated Blood Loss: Minimal               Indications: This is a 76 year old female was found on screening mammography to have 2 lesions in her left breast at the 1130 12 o'clock position.  Biopsy was performed on both lesions.  1 was found to be benign but discordant and the other was found to be invasive breast cancer.  The decision was made to proceed with a radioactive seed guided lumpectomy with 2 seeds to remove both areas.  Sentinel lymph node biopsy was also recommended but the patient refused.  Procedure: The patient is brought to operating identifies correct patient.  She is placed upon the operating table and general anesthesia was induced.  Her left breast was prepped and draped in usual sterile fashion.  There was a hematoma at the area of the biopsy.  With the neoprobe I could identify only 1 signal as the seeds were close together.  I elected to make an incision over the top of this area.  I anesthetized skin with Marcaine and made a transverse incision with a scalpel.  I then dissected down to the breast tissue with the cautery.  With a neoprobe I then dissected medially to the area of the seed and then down to the chest wall.  I was then able to identify the second seed with the neoprobe and dissected more laterally as well with the aid of the neoprobe.  I then continued dissection anterior and inferior and took the dissection all the way down to the chest wall.  I then completed a lumpectomy staying underneath both radioactive seeds  going all the way down to the pectoralis.  Once the lumpectomy was completed, I marked all margins with paint.  An x-ray was performed on the specimen confirming the both radioactive seeds and both previous biopsy clips were in the specimen.  The specimen was then sent to pathology for evaluation.  I achieved hemostasis with the cautery.  I anesthetized the incision further with Marcaine.  I placed surgical clips around the periphery of the biopsy cavity.  I then closed the deep tissue with interrupted 3-0 Vicryl sutures.  I then closed the subtenons tissue with interrupted 3-0 Vicryl sutures and closed the skin with a running 4-0 Monocryl.  Dermabond was then applied.  The patient was placed in a breast binder.  She was then extubated in the operating room and taken in stable condition to the recovery room.          Coralie Keens   Date: 04/06/2021  Time: 8:09 AM

## 2021-04-06 NOTE — Interval H&P Note (Signed)
History and Physical Interval Note: no change in H and P except again, patient has decided not to undergo a sentinel lymph node biopsy  04/06/2021 6:52 AM  Deanna Johnston  has presented today for surgery, with the diagnosis of LEFT BREAST CANCER.  The various methods of treatment have been discussed with the patient and family. After consideration of risks, benefits and other options for treatment, the patient has consented to  Procedure(s): LEFT BREAST LUMPECTOMY WITH RADIOACTIVE SEED LOCALIZATION (Left) POSSIBLE SENTINEL NODE BIOPSY (Left) as a surgical intervention.  The patient's history has been reviewed, patient examined, no change in status, stable for surgery.  I have reviewed the patient's chart and labs.  Questions were answered to the patient's satisfaction.     Coralie Keens

## 2021-04-06 NOTE — Transfer of Care (Signed)
Immediate Anesthesia Transfer of Care Note  Patient: Deanna Johnston  Procedure(s) Performed: LEFT BREAST LUMPECTOMY WITH RADIOACTIVE SEED X2 LOCALIZATION (Left: Breast)  Patient Location: PACU  Anesthesia Type:General  Level of Consciousness: drowsy  Airway & Oxygen Therapy: Patient Spontanous Breathing and Patient connected to face mask oxygen  Post-op Assessment: Report given to RN and Post -op Vital signs reviewed and stable  Post vital signs: Reviewed and stable  Last Vitals:  Vitals Value Taken Time  BP 151/54 04/06/21 0818  Temp    Pulse 75 04/06/21 0819  Resp 19 04/06/21 0819  SpO2 98 % 04/06/21 0819  Vitals shown include unvalidated device data.  Last Pain:  Vitals:   04/06/21 0621  TempSrc:   PainSc: 0-No pain      Patients Stated Pain Goal: 0 (94/12/90 4753)  Complications: No notable events documented.

## 2021-04-06 NOTE — Discharge Instructions (Signed)
[facility] °Office Phone Number [phone] ° °BREAST BIOPSY/ PARTIAL MASTECTOMY: POST OP INSTRUCTIONS ° °Always review your discharge instruction sheet given to you by the facility where your surgery was performed. ° °IF YOU HAVE DISABILITY OR FAMILY LEAVE FORMS, YOU MUST BRING THEM TO THE OFFICE FOR PROCESSING.  DO NOT GIVE THEM TO YOUR DOCTOR. ° °A prescription for pain medication may be given to you upon discharge.  Take your pain medication as prescribed, if needed.  If narcotic pain medicine is not needed, then you may take acetaminophen (Tylenol) or ibuprofen (Advil) as needed. °Take your usually prescribed medications unless otherwise directed °If you need a refill on your pain medication, please contact your pharmacy.  They will contact our office to request authorization.  Prescriptions will not be filled after 5pm or on week-ends. °You should eat very light the first 24 hours after surgery, such as soup, crackers, pudding, etc.  Resume your normal diet the day after surgery. °Most patients will experience some swelling and bruising in the breast.  Ice packs and a good support bra will help.  Swelling and bruising can take several days to resolve.  °It is common to experience some constipation if taking pain medication after surgery.  Increasing fluid intake and taking a stool softener will usually help or prevent this problem from occurring.  A mild laxative (Milk of Magnesia or Miralax) should be taken according to package directions if there are no bowel movements after 48 hours. °Unless discharge instructions indicate otherwise, you may remove your bandages 24-48 hours after surgery, and you may shower at that time.  You may have steri-strips (small skin tapes) in place directly over the incision.  These strips should be left on the skin for 7-10 days.  If your surgeon used skin glue on the incision, you may shower in 24 hours.  The glue will flake off over the next 2-3 weeks.  Any  sutures or staples will be removed at the office during your follow-up visit. °ACTIVITIES:  You may resume regular daily activities (gradually increasing) beginning the next day.  Wearing a good support bra or sports bra minimizes pain and swelling.  You may have sexual intercourse when it is comfortable. °You may drive when you no longer are taking prescription pain medication, you can comfortably wear a seatbelt, and you can safely maneuver your car and apply brakes. °RETURN TO WORK:  ______________________________________________________________________________________ °You should see your doctor in the office for a follow-up appointment approximately two weeks after your surgery.  Your doctor’s nurse will typically make your follow-up appointment when she calls you with your pathology report.  Expect your pathology report 2-3 business days after your surgery.  You may call to check if you do not hear from us after three days. °OTHER INSTRUCTIONS: OK TO REMOVE THE BINDER AND SHOWER STARTING TOMORROW °ICE PACK, TYLENOL, AND IBUPROFEN ALSO FOR PAIN °NO VIGOROUS ACTIVITY FOR ONE WEEK °_______________________________________________________________________________________________ _____________________________________________________________________________________________________________________________________ °_____________________________________________________________________________________________________________________________________ °_____________________________________________________________________________________________________________________________________ ° °WHEN TO CALL YOUR DOCTOR: °Fever over 101.0 °Nausea and/or vomiting. °Extreme swelling or bruising. °Continued bleeding from incision. °Increased pain, redness, or drainage from the incision. ° °The clinic staff is available to answer your questions during regular business hours.  Please don’t hesitate to call and ask to speak to one of the  nurses for clinical concerns.  If you have a medical emergency, go to the nearest emergency room or call 911.  A surgeon from [facility] is always on call at the hospital. ° °For   further questions, please visit [email or web address]   °

## 2021-04-06 NOTE — Anesthesia Postprocedure Evaluation (Signed)
Anesthesia Post Note  Patient: Deanna Johnston  Procedure(s) Performed: LEFT BREAST LUMPECTOMY WITH RADIOACTIVE SEED X2 LOCALIZATION (Left: Breast)     Patient location during evaluation: PACU Anesthesia Type: General Level of consciousness: awake and alert Pain management: pain level controlled Vital Signs Assessment: post-procedure vital signs reviewed and stable Respiratory status: spontaneous breathing, nonlabored ventilation and respiratory function stable Cardiovascular status: blood pressure returned to baseline and stable Postop Assessment: no apparent nausea or vomiting Anesthetic complications: no   No notable events documented.  Last Vitals:  Vitals:   04/06/21 0833 04/06/21 0848  BP: (!) 153/56 (!) 151/63  Pulse: 71 68  Resp: 18 14  Temp:  36.6 C  SpO2: 94% 95%    Last Pain:  Vitals:   04/06/21 0848  TempSrc:   PainSc: 0-No pain                 Lidia Collum

## 2021-04-07 ENCOUNTER — Encounter (HOSPITAL_COMMUNITY): Payer: Self-pay | Admitting: Surgery

## 2021-04-08 ENCOUNTER — Telehealth: Payer: Self-pay | Admitting: Genetic Counselor

## 2021-04-08 DIAGNOSIS — Z1379 Encounter for other screening for genetic and chromosomal anomalies: Secondary | ICD-10-CM

## 2021-04-08 LAB — SURGICAL PATHOLOGY

## 2021-04-08 NOTE — Telephone Encounter (Signed)
I contacted [name] to discuss her genetic testing results. No pathogenic variants were identified in the 36 genes analyzed. Detailed clinic note to follow.  The test report has been scanned into EPIC and is located under the Molecular Pathology section of the Results Review tab.  A portion of the result report is included below for reference.   Lucille Passy, MS, [facility] Genetic Counselor [email or web address] (P) [phone]

## 2021-04-11 ENCOUNTER — Ambulatory Visit: Payer: Self-pay | Admitting: Genetic Counselor

## 2021-04-11 DIAGNOSIS — Z1379 Encounter for other screening for genetic and chromosomal anomalies: Secondary | ICD-10-CM

## 2021-04-11 DIAGNOSIS — C50912 Malignant neoplasm of unspecified site of left female breast: Secondary | ICD-10-CM | POA: Diagnosis not present

## 2021-04-11 NOTE — Progress Notes (Signed)
HPI:   Deanna Johnston was previously seen in the [place] clinic due to a personal and family history of cancer and concerns regarding a hereditary predisposition to cancer. Please refer to our prior cancer genetics clinic note for more information regarding our discussion, assessment and recommendations, at the time. Deanna Johnston's recent genetic test results were disclosed to her, as were recommendations warranted by these results. These results and recommendations are discussed in more detail below.  CANCER HISTORY:  Oncology History  Malignant neoplasm of upper-inner quadrant of left female breast ([place])  02/25/2021 Initial Diagnosis   Screening mammogram: possible masses in the left breast.  11:30 position: 1.7 cm mass and biopsy grade 2 IDC prognostic panel pending, adjacent 0.4 cm mass at 4 o'clock position: Biopsy benign (discordant), axilla negative   03/09/2021 Cancer Staging   Staging form: Breast, AJCC 8th Edition - Clinical stage from 03/09/2021: Stage IA (cT1c, cN0, cM0, G2, ER+, PR+, HER2: Equivocal) - Signed by Nicholas Lose, MD on 03/09/2021 Stage prefix: Initial diagnosis Histologic grading system: 3 grade system     Genetic Testing   [facility] CancerNext is Negative. Report date was 04/01/2021.  The CancerNext gene panel offered by [employer] includes sequencing, rearrangement analysis, and RNA analysis for the following 36 genes:   APC, ATM, AXIN2, BARD1, BMPR1A, BRCA1, BRCA2, BRIP1, CDH1, CDK4, CDKN2A, CHEK2, DICER1, HOXB13, EPCAM, GREM1, MLH1, MSH2, MSH3, MSH6, MUTYH, NBN, NF1, NTHL1, PALB2, PMS2, POLD1, POLE, PTEN, RAD51C, RAD51D, RECQL, SMAD4, SMARCA4, STK11, and TP53.      FAMILY HISTORY:  We obtained a detailed, 4-generation family history.  Significant diagnoses are listed below:      Family History  Problem Relation Age of Onset   Heart failure Mother     Stomach cancer Mother 52   Heart disease Father     Diabetes Father     Colon cancer Sister 53    Melanoma Sister     Heart disease Brother     Cancer Maternal Grandmother          unknown type     Deanna Johnston reports a sister with a history of melanoma diagnosed at an unknown age and another sister with a history of colon cancer diagnosed at 74. Her mother was diagnosed with stomach cancer at age 59 and died at age 53. Her maternal grandmother was diagnosed with an unknown type of cancer, she is deceased. Deanna Johnston is unaware of previous family history of genetic testing for hereditary cancer risks. There is no reported Ashkenazi Jewish ancestry.   GENETIC TEST RESULTS:  The [employer] CancerNext Panel found no pathogenic mutations.  The CancerNext gene panel offered by [employer] includes sequencing, rearrangement analysis, and RNA analysis for the following 36 genes:   APC, ATM, AXIN2, BARD1, BMPR1A, BRCA1, BRCA2, BRIP1, CDH1, CDK4, CDKN2A, CHEK2, DICER1, HOXB13, EPCAM, GREM1, MLH1, MSH2, MSH3, MSH6, MUTYH, NBN, NF1, NTHL1, PALB2, PMS2, POLD1, POLE, PTEN, RAD51C, RAD51D, RECQL, SMAD4, SMARCA4, STK11, and TP53.    The test report has been scanned into EPIC and is located under the Molecular Pathology section of the Results Review tab.  A portion of the result report is included below for reference. Genetic testing reported out on 04/01/2021.        Even though a pathogenic variant was not identified, possible explanations for her personal history of cancer may include: There may be no hereditary risk for cancer in the family. The cancers in Deanna Johnston and/or her family may be  due to other genetic or environmental factors. There may be a gene mutation in one of these genes that current testing methods cannot detect, but that chance is small. There could be another gene that has not yet been discovered, or that we have not yet tested, that is responsible for the cancer diagnoses in the family.   Therefore, it is important to remain in touch with cancer genetics in the future so that  we can continue to offer Deanna Johnston the most up to date genetic testing.   ADDITIONAL GENETIC TESTING:  We discussed with Deanna Johnston that her genetic testing was fairly extensive.  If there are genes identified to increase cancer risk that can be analyzed in the future, we would be happy to discuss and coordinate this testing at that time.    CANCER SCREENING RECOMMENDATIONS:  Deanna Johnston test result is considered negative (normal).  This means that we have not identified a hereditary cause for her personal and family history of cancer at this time. Most cancers happen by chance and this negative test suggests that her cancer may fall into this category.    An individual's cancer risk and medical management are not determined by genetic test results alone. Overall cancer risk assessment incorporates additional factors, including personal medical history, family history, and any available genetic information that may result in a personalized plan for cancer prevention and surveillance. Therefore, it is recommended she continue to follow the cancer management and screening guidelines provided by her oncology and primary healthcare provider.  FOLLOW-UP:  Cancer genetics is a rapidly advancing field and it is possible that new genetic tests will be appropriate for her and/or her family members in the future. We encouraged her to remain in contact with cancer genetics on an annual basis so we can update her personal and family histories and let her know of advances in cancer genetics that may benefit this family.   Our contact number was provided. Deanna Johnston's questions were answered to her satisfaction, and she knows she is welcome to call us at anytime with additional questions or concerns.   Deanna Passy, MS, [facility] Genetic Counselor [email or web address] (P) [phone]

## 2021-04-12 ENCOUNTER — Encounter: Payer: Self-pay | Admitting: *Deleted

## 2021-04-18 NOTE — Progress Notes (Signed)
? ?Patient Care Team: ?Kelton Pillar, MD as PCP - General (Family Medicine) ?Bensimhon, Shaune Pascal, MD as PCP - Advanced Heart Failure (Cardiology) ?Mauro Kaufmann, RN as Oncology Nurse Navigator ?Rockwell Germany, RN as Oncology Nurse Navigator ?Coralie Keens, MD as Consulting Physician (General Surgery) ?Nicholas Lose, MD as Consulting Physician (Hematology and Oncology) ?Gery Pray, MD as Consulting Physician (Radiation Oncology) ? ?DIAGNOSIS:  ?  ICD-10-CM   ?1. Malignant neoplasm of upper-inner quadrant of left breast in female, estrogen receptor positive ([place])  C50.212   ? Z17.0   ?  ? ? ?SUMMARY OF ONCOLOGIC HISTORY: ?Oncology History  ?Malignant neoplasm of upper-inner quadrant of left female breast ([place])  ?02/25/2021 Initial Diagnosis  ? Screening mammogram: possible masses in the left breast.  11:30 position: 1.7 cm mass and biopsy grade 2 IDC prognostic panel pending, adjacent 0.4 cm mass at 4 o'clock position: Biopsy benign (discordant), axilla negative ?  ?03/09/2021 Cancer Staging  ? Staging form: Breast, AJCC 8th Edition ?- Clinical stage from 03/09/2021: Stage IA (cT1c, cN0, cM0, G2, ER+, PR+, HER2: Equivocal) - Signed by Nicholas Lose, MD on 03/09/2021 ?Stage prefix: Initial diagnosis ?Histologic grading system: 3 grade system ? ?  ? Genetic Testing  ? [facility] CancerNext is Negative. Report date was 04/01/2021. ? ?The CancerNext gene panel offered by [employer] includes sequencing, rearrangement analysis, and RNA analysis for the following 36 genes:   APC, ATM, AXIN2, BARD1, BMPR1A, BRCA1, BRCA2, BRIP1, CDH1, CDK4, CDKN2A, CHEK2, DICER1, HOXB13, EPCAM, GREM1, MLH1, MSH2, MSH3, MSH6, MUTYH, NBN, NF1, NTHL1, PALB2, PMS2, POLD1, POLE, PTEN, RAD51C, RAD51D, RECQL, SMAD4, SMARCA4, STK11, and TP53.  ?  ?04/06/2021 Surgery  ? Left lumpectomy: Grade 2 well-differentiated IDC 2.3 cm focal DCIS, margins negative, ER 95%, PR 95%, HER2 equivocal by IHC, negative by FISH, Ki-67 5% ?  ? ? ?CHIEF COMPLIANT:  Follow-up of left breast cancer ? ?INTERVAL HISTORY: Deanna Johnston is a 76 y.o. with above-mentioned history of left breast cancer.  Left lumpectomy on 04/06/2021 showed grade 2 moderately differentiated invasive ductal adenocarcinoma. She presents to the clinic today for follow-up.  ?She has done well with surgery.  Denies any pain or discomfort. ? ?ALLERGIES:  is allergic to lipitor [atorvastatin], crestor [rosuvastatin calcium], prednisone, sulfa antibiotics, synthroid [levothyroxine sodium], and niaspan [niacin]. ? ?MEDICATIONS:  ?Current Outpatient Medications  ?Medication Sig Dispense Refill  ? albuterol (PROAIR HFA) 108 (90 Base) MCG/ACT inhaler 2 puffs every 4 hours as needed only  if your can't catch your breath 1 Inhaler 1  ? Ascorbic Acid (VITAMIN C) 100 MG tablet Take 100 mg by mouth daily.    ? aspirin EC 81 MG tablet Take 81 mg by mouth daily.    ? b complex vitamins tablet Take 1 tablet by mouth daily.    ? furosemide (LASIX) 40 MG tablet Take 1 tablet by mouth once daily 30 tablet 0  ? Ginger 500 MG CAPS Take 500 mg by mouth daily.    ? Glucosamine-Chondroit-Vit C-Mn (GLUCOSAMINE 1500 COMPLEX) CAPS Take 1 capsule by mouth daily.    ? Honey 5 GM/5ML SYRP Take 5 mLs by mouth daily.    ? losartan (COZAAR) 50 MG tablet Take 1 tablet (50 mg total) by mouth daily. (Patient not taking: Reported on 03/25/2021) 90 tablet 3  ? nitroGLYCERIN (NITROSTAT) 0.4 MG SL tablet DISSOLVE ONE TABLET UNDER THE TONGUE EVERY 5 MINUTES AS NEEDED FOR CHEST PAIN. 25 tablet 1  ? NP THYROID 60 MG tablet Take 60  mg by mouth daily.    ? nystatin cream (MYCOSTATIN) Apply 1 application topically as needed for dry skin.    ? OXYGEN Inhale 2 L into the lungs at bedtime.    ? potassium chloride SA (KLOR-CON) 20 MEQ tablet Take 1 tablet by mouth once daily 30 tablet 11  ? traMADol (ULTRAM) 50 MG tablet Take 1 tablet (50 mg total) by mouth every 6 (six) hours as needed. 20 tablet 0  ? Turmeric 400 MG CAPS Take 400 mg by mouth daily.     ? ?No current facility-administered medications for this visit.  ? ? ?PHYSICAL EXAMINATION: ?ECOG PERFORMANCE STATUS: 1 - Symptomatic but completely ambulatory ? ?Vitals:  ? 04/19/21 1428  ?BP: (!) 147/68  ?Pulse: 88  ?Resp: 19  ?Temp: 97.8 ?F (36.6 ?C)  ?SpO2: 93%  ? ?Filed Weights  ? 04/19/21 1428  ?Weight: 159 lb (72.1 kg)  ?  ? ?LABORATORY DATA:  ?I have reviewed the data as listed ?CMP Latest Ref Rng & Units 03/09/2021 10/26/2020 05/16/2017  ?Glucose 70 - 99 mg/dL 93 99 128(H)  ?BUN 8 - 23 mg/dL '15 19 14  ' ?Creatinine 0.44 - 1.00 mg/dL 0.94 0.88 0.93  ?Sodium 135 - 145 mmol/L 140 138 137  ?Potassium 3.5 - 5.1 mmol/L 3.9 3.3(L) 3.5  ?Chloride 98 - 111 mmol/L 105 103 98(L)  ?CO2 22 - 32 mmol/L '30 25 29  ' ?Calcium 8.9 - 10.3 mg/dL 9.7 9.5 9.3  ?Total Protein 6.5 - 8.1 g/dL 7.7 7.7 -  ?Total Bilirubin 0.3 - 1.2 mg/dL 0.6 0.8 -  ?Alkaline Phos 38 - 126 U/L 90 98 -  ?AST 15 - 41 U/L 17 21 -  ?ALT 0 - 44 U/L 14 16 -  ? ? ?Lab Results  ?Component Value Date  ? WBC 8.5 03/09/2021  ? HGB 15.8 (H) 03/09/2021  ? HCT 49.8 (H) 03/09/2021  ? MCV 85.9 03/09/2021  ? PLT 243 03/09/2021  ? NEUTROABS 5.6 03/09/2021  ? ? ?ASSESSMENT & PLAN:  ?Malignant neoplasm of upper-inner quadrant of left female breast ([place]) ?02/25/2021:Screening mammogram: possible masses in the left breast.  11:30 position: 1.7 cm mass and biopsy grade 2 IDC prognostic panel pending, adjacent 0.4 cm mass at 4 o'clock position: Biopsy benign (discordant), axilla negative ? ?Left lumpectomy 04/06/2021: Grade 2 invasive ductal adenocarcinoma 2.3 cm, focal DCIS, margins negative, ER 95%, PR 95%, HER2 equivocal by IHC negative by FISH, Ki-67 5% ? ?Pathology counseling: I discussed the final pathology report of the patient provided  a copy of this report. I discussed the margins as well as lymph node surgeries. We also discussed the final staging along with previously performed ER/PR and HER-2/neu testing. ?Patient refuses radiation ? ?Treatment plan: ?adjuvant  antiestrogen therapy with anastrozole: She will start anastrozole 1 tablet every other day for a month and then start taking once a month after that.  If she tolerates it well she will continue if not she will go back to every other day. ? ?Telephone visit in 3 months to assess tolerance to anastrozole therapy. ? ? ? ? ?No orders of the defined types were placed in this encounter. ? ?The patient has a good understanding of the overall plan. she agrees with it. she will call with any problems that may develop before the next visit here. ? ?Total time spent: 30 mins including face to face time and time spent for planning, charting and coordination of care ? ?Rulon Eisenmenger, MD, MPH ?04/19/2021 ? ?I,  Thana Ates, am acting as scribe for Dr. Nicholas Lose. ? ?I have reviewed the above documentation for accuracy and completeness, and I agree with the above. ? ? ? ? ? ? ?

## 2021-04-19 ENCOUNTER — Inpatient Hospital Stay: Payer: Medicare Other | Attending: Hematology and Oncology | Admitting: Hematology and Oncology

## 2021-04-19 ENCOUNTER — Other Ambulatory Visit: Payer: Self-pay

## 2021-04-19 DIAGNOSIS — C50212 Malignant neoplasm of upper-inner quadrant of left female breast: Secondary | ICD-10-CM | POA: Diagnosis not present

## 2021-04-19 DIAGNOSIS — Z170 Estrogen receptor positive status [ER+]: Secondary | ICD-10-CM | POA: Diagnosis not present

## 2021-04-19 MED ORDER — ANASTROZOLE 1 MG PO TABS: 1.0000 mg | ORAL_TABLET | Freq: Every day | ORAL | 3 refills | Status: DC

## 2021-04-19 MED ORDER — ANASTROZOLE 1 MG PO TABS: 1.0000 mg | ORAL_TABLET | Freq: Every day | ORAL | 11 refills | Status: DC

## 2021-04-19 NOTE — Assessment & Plan Note (Signed)
02/25/2021:Screening mammogram: possible masses in the left breast.  11:30 position: 1.7 cm mass and biopsy grade 2 IDC prognostic panel pending, adjacent 0.4 cm mass at 4 o'clock position: Biopsy benign (discordant), axilla negative ? ?Left lumpectomy 04/06/2021: Grade 2 invasive ductal adenocarcinoma 2.3 cm, focal DCIS, margins negative, ER 95%, PR 95%, HER2 equivocal by IHC negative by FISH, Ki-67 5% ? ?Pathology counseling: I discussed the final pathology report of the patient provided  a copy of this report. I discussed the margins as well as lymph node surgeries. We also discussed the final staging along with previously performed ER/PR and HER-2/neu testing. ? ?Treatment plan: ?1. Adjuvant radiation ?2. adjuvant antiestrogen therapy ? ?Return to clinic after radiation to start antiestrogens ? ? ?

## 2021-04-25 NOTE — Progress Notes (Incomplete)
Location of Breast Cancer: upper-inner quadrant of left breast  ? ?Histology per Pathology Report:  ?1. Breast, left, needle core biopsy, 12 o'clock, ribbon ?- FIBROSIS AND USUAL DUCTAL HYPERPLASIA. NO MALIGNANCY. ?2. Breast, left, needle core biopsy, 1130 o'clock, coil ?- INVASIVE DUCTAL CARCINOMA, GRADE 2, INVOLVING 3 OF 3 BIOPSY SPECIMENS. MAXIMAL EXTENT 0.6 CM. ?SEE COMMENT. ? ?A. LEFT BREAST, LUMPECTOMY:  ?Invasive moderately differentiated ductal adenocarcinoma, grade 2  ?(3+2+1)  ?Tumor measures 2.3 x 2.1 x 1.7 cm (pT2)  ?Focal ductal carcinoma in situ, intermediate nuclear grade, cribriform  ?type without necrosis  ?Margins free (2 mm from posterior margin)  ?Proliferative fibrocystic changes including extensive stromal fibrosis  ?with adenosis and epithelial hyperplasia without atypia  ?Benign intraductal micropapilloma and sclerosed papilloma  ?Microcalcifications present within benign breast and peritumoral stroma  ? ?Receptor Status:  ?Estrogen Receptor: Positive (95%, strong)  ?           Progesterone Receptor: Positive (95%, strong)  ?           HER2: Equivocal by IHC, negative by FISH  ?           Ki-67: 5%  ? ?Did patient present with symptoms (if so, please note symptoms) or was this found on screening mammography?: Screening mammogram: possible masses in the left breast. ? ?Past/Anticipated interventions by surgeon, if any:  ?Procedure(s): ?LEFT BREAST LUMPECTOMY WITH RADIOACTIVE SEED X2 LOCALIZATION ?Surgeon(s): ?Blackman, Douglas, MD ? ?Past/Anticipated interventions by medical oncology, if any: Dr Gudena ?Treatment plan: ?adjuvant antiestrogen therapy with anastrozole ? ?Lymphedema issues, if any:  {:18581} {t:21944}  ? ?Pain issues, if any:  {:18581} {PAIN DESCRIPTION:21022940} ? ?SAFETY ISSUES: ?Prior radiation? {:18581} ?Pacemaker/ICD? {:18581} ?Possible current pregnancy?no, hysterectomy ?Is the patient on methotrexate? {:18581} ? ?Current Complaints / other details:  *** ?   ?*** ? ? ?

## 2021-04-26 ENCOUNTER — Other Ambulatory Visit (HOSPITAL_COMMUNITY): Payer: Self-pay | Admitting: Internal Medicine

## 2021-04-28 ENCOUNTER — Encounter (HOSPITAL_COMMUNITY): Payer: Self-pay

## 2021-04-28 ENCOUNTER — Telehealth: Payer: Self-pay | Admitting: Radiation Oncology

## 2021-04-28 NOTE — Telephone Encounter (Signed)
Returned call to let pt know message was received. LVM stating appts would be cx and to call back with any questions.  ?

## 2021-04-28 NOTE — Telephone Encounter (Signed)
Pt left message stating she is no longer interested in XRT, all appts cx per request. ?

## 2021-05-02 ENCOUNTER — Ambulatory Visit: Payer: Medicare Other | Admitting: Radiation Oncology

## 2021-05-02 ENCOUNTER — Ambulatory Visit: Payer: Medicare Other

## 2021-05-02 DIAGNOSIS — C50212 Malignant neoplasm of upper-inner quadrant of left female breast: Secondary | ICD-10-CM

## 2021-05-03 DIAGNOSIS — J342 Deviated nasal septum: Secondary | ICD-10-CM | POA: Diagnosis not present

## 2021-05-03 DIAGNOSIS — J310 Chronic rhinitis: Secondary | ICD-10-CM | POA: Diagnosis not present

## 2021-05-03 DIAGNOSIS — H6983 Other specified disorders of Eustachian tube, bilateral: Secondary | ICD-10-CM | POA: Diagnosis not present

## 2021-05-03 DIAGNOSIS — J449 Chronic obstructive pulmonary disease, unspecified: Secondary | ICD-10-CM | POA: Diagnosis not present

## 2021-05-05 DIAGNOSIS — Z0000 Encounter for general adult medical examination without abnormal findings: Secondary | ICD-10-CM | POA: Diagnosis not present

## 2021-05-05 DIAGNOSIS — Z23 Encounter for immunization: Secondary | ICD-10-CM | POA: Diagnosis not present

## 2021-05-05 DIAGNOSIS — M810 Age-related osteoporosis without current pathological fracture: Secondary | ICD-10-CM | POA: Diagnosis not present

## 2021-05-05 DIAGNOSIS — E039 Hypothyroidism, unspecified: Secondary | ICD-10-CM | POA: Diagnosis not present

## 2021-05-05 DIAGNOSIS — Z1389 Encounter for screening for other disorder: Secondary | ICD-10-CM | POA: Diagnosis not present

## 2021-05-05 DIAGNOSIS — G720 Drug-induced myopathy: Secondary | ICD-10-CM | POA: Diagnosis not present

## 2021-05-05 DIAGNOSIS — E7800 Pure hypercholesterolemia, unspecified: Secondary | ICD-10-CM | POA: Diagnosis not present

## 2021-05-05 DIAGNOSIS — I509 Heart failure, unspecified: Secondary | ICD-10-CM | POA: Diagnosis not present

## 2021-05-05 DIAGNOSIS — E559 Vitamin D deficiency, unspecified: Secondary | ICD-10-CM | POA: Diagnosis not present

## 2021-05-05 DIAGNOSIS — J449 Chronic obstructive pulmonary disease, unspecified: Secondary | ICD-10-CM | POA: Diagnosis not present

## 2021-05-05 DIAGNOSIS — I2510 Atherosclerotic heart disease of native coronary artery without angina pectoris: Secondary | ICD-10-CM | POA: Diagnosis not present

## 2021-05-05 DIAGNOSIS — C50919 Malignant neoplasm of unspecified site of unspecified female breast: Secondary | ICD-10-CM | POA: Diagnosis not present

## 2021-05-05 DIAGNOSIS — I119 Hypertensive heart disease without heart failure: Secondary | ICD-10-CM | POA: Diagnosis not present

## 2021-05-09 ENCOUNTER — Encounter: Payer: Self-pay | Admitting: *Deleted

## 2021-05-09 DIAGNOSIS — C50212 Malignant neoplasm of upper-inner quadrant of left female breast: Secondary | ICD-10-CM

## 2021-05-16 DIAGNOSIS — H9313 Tinnitus, bilateral: Secondary | ICD-10-CM | POA: Diagnosis not present

## 2021-05-16 DIAGNOSIS — H903 Sensorineural hearing loss, bilateral: Secondary | ICD-10-CM | POA: Diagnosis not present

## 2021-05-30 ENCOUNTER — Other Ambulatory Visit (HOSPITAL_COMMUNITY): Payer: Self-pay | Admitting: Internal Medicine

## 2021-06-10 ENCOUNTER — Telehealth: Payer: Self-pay | Admitting: *Deleted

## 2021-06-16 DIAGNOSIS — Z1211 Encounter for screening for malignant neoplasm of colon: Secondary | ICD-10-CM | POA: Diagnosis not present

## 2021-07-18 NOTE — Progress Notes (Addendum)
HEMATOLOGY-ONCOLOGY TELEPHONE VISIT PROGRESS NOTE  I connected with Vaughan Basta on 08/01/21 at  9:00 AM EDT by telephone and verified that I am speaking with the correct person using two identifiers.  I discussed the limitations, risks, security and privacy concerns of performing an evaluation and management service by telephone and the availability of in person appointments.  I also discussed with the patient that there may be a patient responsible charge related to this service. The patient expressed understanding and agreed to proceed.   History of Present Illness: Deanna Johnston is a 76 y.o. with above-mentioned history of left breast cancer. She presents to the clinic via phone visit.   Oncology History  Malignant neoplasm of upper-inner quadrant of left female breast ([place])  02/25/2021 Initial Diagnosis   Screening mammogram: possible masses in the left breast.  11:30 position: 1.7 cm mass and biopsy grade 2 IDC prognostic panel pending, adjacent 0.4 cm mass at 4 o'clock position: Biopsy benign (discordant), axilla negative   03/09/2021 Cancer Staging   Staging form: Breast, AJCC 8th Edition - Clinical stage from 03/09/2021: Stage IA (cT1c, cN0, cM0, G2, ER+, PR+, HER2: Equivocal) - Signed by Nicholas Lose, MD on 03/09/2021 Stage prefix: Initial diagnosis Histologic grading system: 3 grade system    Genetic Testing   [facility] CancerNext is Negative. Report date was 04/01/2021.  The CancerNext gene panel offered by [employer] includes sequencing, rearrangement analysis, and RNA analysis for the following 36 genes:   APC, ATM, AXIN2, BARD1, BMPR1A, BRCA1, BRCA2, BRIP1, CDH1, CDK4, CDKN2A, CHEK2, DICER1, HOXB13, EPCAM, GREM1, MLH1, MSH2, MSH3, MSH6, MUTYH, NBN, NF1, NTHL1, PALB2, PMS2, POLD1, POLE, PTEN, RAD51C, RAD51D, RECQL, SMAD4, SMARCA4, STK11, and TP53.    04/06/2021 Surgery   Left lumpectomy: Grade 2 well-differentiated IDC 2.3 cm focal DCIS, margins negative, ER 95%, PR 95%, HER2 equivocal  by IHC, negative by FISH, Ki-67 5%   04/19/2021 -  Anti-estrogen oral therapy   Patient refused radiation. Anastrozole therapy (we have concerns regarding her bone health but she is willing to start the antiestrogen therapy and not interested in getting bone density)     REVIEW OF SYSTEMS:   Constitutional: Denies fevers, chills or abnormal weight loss  All other systems were reviewed with the patient and are negative. Observations/Objective:    Assessment Plan:  Malignant neoplasm of upper-inner quadrant of left female breast ([place]) 02/25/2021:Screening mammogram: possible masses in the left breast.  11:30 position: 1.7 cm mass and biopsy grade 2 IDC prognostic panel pending, adjacent 0.4 cm mass at 4 o'clock position: Biopsy benign (discordant), axilla negative   Left lumpectomy 04/06/2021: Grade 2 invasive ductal adenocarcinoma 2.3 cm, focal DCIS, margins negative, ER 95%, PR 95%, HER2 equivocal by IHC negative by FISH, Ki-67 5%  Patient refused radiation   Treatment plan: adjuvant antiestrogen therapy with anastrozole: She will start anastrozole 1 tablet every other day for a month and then start taking once a month after that.  If she tolerates it well she will continue if not she will go back to every other day.   Anastrozole toxicities: Upset stomach and trouble breathing (after repeated attempts) Patient will be followed by long-term survivorship for her annual checkups.    I discussed the assessment and treatment plan with the patient. The patient was provided an opportunity to ask questions and all were answered. The patient agreed with the plan and demonstrated an understanding of the instructions. The patient was advised to call back or seek an in-person evaluation if the  symptoms worsen or if the condition fails to improve as anticipated.   I provided 12 minutes of non-face-to-face time during this encounter. Harriette Ohara, MD   I Gardiner Coins am scribing for Dr.  Lindi Adie  I have reviewed the above documentation for accuracy and completeness, and I agree with the above.

## 2021-08-01 ENCOUNTER — Encounter: Payer: Self-pay | Admitting: *Deleted

## 2021-08-01 ENCOUNTER — Inpatient Hospital Stay: Payer: Medicare Other | Attending: Hematology and Oncology | Admitting: Hematology and Oncology

## 2021-08-01 ENCOUNTER — Inpatient Hospital Stay (HOSPITAL_BASED_OUTPATIENT_CLINIC_OR_DEPARTMENT_OTHER): Payer: Medicare Other | Admitting: *Deleted

## 2021-08-01 DIAGNOSIS — C50212 Malignant neoplasm of upper-inner quadrant of left female breast: Secondary | ICD-10-CM

## 2021-08-01 DIAGNOSIS — Z170 Estrogen receptor positive status [ER+]: Secondary | ICD-10-CM | POA: Diagnosis not present

## 2021-08-01 NOTE — Progress Notes (Signed)
2 Identifiers were used for verification purposes only for this appt. No vitals were taken as this was a telephone visit. Pt denies pain today. Allergies and medication list was  reviewed and reconciled. Pt stated she was not able to tolerate the anastrozole and is no longer taking this medication. Pt had no complaints of pain to left breast. Pt will see Dr. Ninfa Linden in Sept. Last PCP visit was in April,2023. Pt stated she had a cologuard test this year and it was negative. Pt states  she does sleep with 2L of oxygen at night and uses a walker to help with mobility.Pt does not exercise on a regular basis, but I did discuss with her about doing some chair aerobics as an alternative to get some exercise. She was open to this and said she may consider. Pt denies use of alcohol and smoking. Denies use of illicit drugs as well. SCP reviewed and completed.

## 2021-08-01 NOTE — Assessment & Plan Note (Addendum)
02/25/2021:Screening mammogram: possible masses in the left breast. 11:30 position: 1.7 cm mass and biopsy grade 2 IDC prognostic panel pending, adjacent 0.4 cm mass at 4 o'clock position: Biopsy benign (discordant), axilla negative  Left lumpectomy 04/06/2021: Grade 2 invasive ductal adenocarcinoma 2.3 cm, focal DCIS, margins negative, ER 95%, PR 95%, HER2 equivocal by IHC negative by FISH, Ki-67 5%  Patient refused radiation  Treatment plan: adjuvant antiestrogen therapy with anastrozole: She will start anastrozole 1 tablet every other day for a month and then start taking once a month after that.  If she tolerates it well she will continue if not she will go back to every other day.  Anastrozole toxicities:  Telephone visit in 3 months for survivorship care plan visit

## 2021-08-01 NOTE — Addendum Note (Signed)
Addended by: Nicholas Lose on: 08/01/2021 09:16 AM   Modules accepted: Level of Service

## 2021-08-11 ENCOUNTER — Other Ambulatory Visit: Payer: Self-pay | Admitting: *Deleted

## 2021-08-11 NOTE — Addendum Note (Signed)
Addended by: Rea College D on: 08/11/2021 03:06 PM   Modules accepted: Orders

## 2021-09-01 ENCOUNTER — Ambulatory Visit: Payer: Medicare Other | Admitting: Obstetrics & Gynecology

## 2021-10-19 DIAGNOSIS — C50912 Malignant neoplasm of unspecified site of left female breast: Secondary | ICD-10-CM | POA: Diagnosis not present

## 2021-10-27 ENCOUNTER — Other Ambulatory Visit: Payer: Self-pay | Admitting: Surgery

## 2021-10-27 DIAGNOSIS — C50912 Malignant neoplasm of unspecified site of left female breast: Secondary | ICD-10-CM

## 2021-11-02 ENCOUNTER — Encounter (HOSPITAL_COMMUNITY): Payer: Self-pay | Admitting: Internal Medicine

## 2021-11-02 ENCOUNTER — Ambulatory Visit (HOSPITAL_COMMUNITY)
Admission: RE | Admit: 2021-11-02 | Discharge: 2021-11-02 | Disposition: A | Discharge status: Home / Self Care | Payer: Medicare Other | Source: Ambulatory Visit | Attending: Internal Medicine | Admitting: Internal Medicine

## 2021-11-02 VITALS — BP 160/60 | HR 80 | Wt 160.4 lb

## 2021-11-02 DIAGNOSIS — Z79899 Other long term (current) drug therapy: Secondary | ICD-10-CM | POA: Insufficient documentation

## 2021-11-02 DIAGNOSIS — Z87891 Personal history of nicotine dependence: Secondary | ICD-10-CM | POA: Insufficient documentation

## 2021-11-02 DIAGNOSIS — I2510 Atherosclerotic heart disease of native coronary artery without angina pectoris: Secondary | ICD-10-CM | POA: Diagnosis not present

## 2021-11-02 DIAGNOSIS — E669 Obesity, unspecified: Secondary | ICD-10-CM | POA: Diagnosis not present

## 2021-11-02 DIAGNOSIS — C50912 Malignant neoplasm of unspecified site of left female breast: Secondary | ICD-10-CM | POA: Insufficient documentation

## 2021-11-02 DIAGNOSIS — J449 Chronic obstructive pulmonary disease, unspecified: Secondary | ICD-10-CM | POA: Diagnosis not present

## 2021-11-02 DIAGNOSIS — I252 Old myocardial infarction: Secondary | ICD-10-CM | POA: Insufficient documentation

## 2021-11-02 DIAGNOSIS — E875 Hyperkalemia: Secondary | ICD-10-CM | POA: Insufficient documentation

## 2021-11-02 DIAGNOSIS — J9611 Chronic respiratory failure with hypoxia: Secondary | ICD-10-CM | POA: Diagnosis not present

## 2021-11-02 DIAGNOSIS — I10 Essential (primary) hypertension: Secondary | ICD-10-CM

## 2021-11-02 DIAGNOSIS — I5022 Chronic systolic (congestive) heart failure: Secondary | ICD-10-CM

## 2021-11-02 MED ORDER — AMLODIPINE BESYLATE 2.5 MG PO TABS: 2.5000 mg | ORAL_TABLET | Freq: Every day | ORAL | 3 refills | Status: DC

## 2021-11-02 NOTE — Patient Instructions (Signed)
START Amlodipine 2.5 mg daily.  Your physician recommends that you schedule a follow-up appointment in: 1 year ( September 2024)  ** please call the office in July 2024 to arrange your follow up appointment **  If you have any questions or concerns before your next appointment please send [place] a message through [place] or call our office at [phone].    TO LEAVE A MESSAGE FOR THE NURSE SELECT OPTION 2, PLEASE LEAVE A MESSAGE INCLUDING: YOUR NAME DATE OF BIRTH CALL BACK NUMBER REASON FOR CALL**this is important as we prioritize the call backs  YOU WILL RECEIVE A CALL BACK THE SAME DAY AS LONG AS YOU CALL BEFORE 4:00 PM  At the [facility], you and your health needs are our priority. As part of our continuing mission to provide you with exceptional heart care, we have created designated Provider Care Teams. These Care Teams include your primary Cardiologist (physician) and Advanced Practice Providers (APPs- Physician Assistants and Nurse Practitioners) who all work together to provide you with the care you need, when you need it.   You may see any of the following providers on your designated Care Team at your next follow up: Dr Glori Bickers Dr Loralie Champagne Dr. Roxana Hires, NP Lyda Jester, [place] [facility] [place], [place] Forestine Na, NP Audry Riles, PharmD   Please be sure to bring in all your medications bottles to every appointment.

## 2021-11-02 NOTE — Progress Notes (Signed)
HF: Dr Haroldine Laws Pulmonary : Dr Melvyn Novas PCP: Dr Kelton Pillar   ADVANCED HF CLINIC NOTE  HPI: Deanna Johnston) is a 76 y/o female with PMH of COPD with ongoing tobacco use, CAD s/p NSTEMI with RCA stents in 2011 and obesity.   Admitted to [facility] on 07/20/16 with PNA and severe cor pulmonale. Echo 07/21/16: LV EF 65-70% with dilated RV and moderate RV HK. Started on dopamine for RV support. CT chest no PE. Diuresed well and renal function improved. Cath with mild PAH after optimization.   Echo 8/20 EF 60-65% RV normal.  Echo 10/26/20 EF 65-70% RV normal.   Here for f/u. Diagnosed 1/23 with left breast CA stage 1A. ER+, PR+, HER2: Equivocal. Underwent left lumpectomy in 2/23. Refused XRT. Tried anastrazole but stopped due to SOB.   Follows BP at home mostly 135-145/70s. Wears 2L O2 at night but not otherwise. Sats 94% at rest. Doesn't check with exertion. No CP. Denies edema, orthopnea or PND. Not taking losartan   Testing   PFTS 8/18 FEV1  1.2 (74%) FVC   1.9 87%)  FEF 25-75 45% DLCO 58% Moderate obstruction with moderate to severe diffusion defect.   Echo 07/21/16: LV EF 65-70% with dilated RV and moderate RV HK. Started on dopamine for RV support. CT chest no PE. Diuresed well and renal function improved.  11/2016  LV function remains normal. RV now improved with normal size and function.  Underwent RHC/LHC on 07/26/16  Mid RCA to Dist RCA lesion, 0 %stenosed. Mid RCA lesion, 20 %stenosed. Dist RCA lesion, 30 %stenosed. Dist Cx lesion, 60 %stenosed. Prox LAD to Mid LAD lesion, 20 %stenosed. The left ventricular ejection fraction is greater than 65% by visual estimate.  Findings:  Done on dopamine 48mg/kg/min  Ao = 110/47 (72) LV = 120/10 RA = 5 RV = 47/7 PA = 42/16 (27) PCW = 6 Fick cardiac output/index = 5.7/3.4 PVR = 4.2 WU SVR 945 Ao sat =  88% PA sat = 71%, 69% High SVC sat = 69%  Assessment: 1. Heavily calcified coronary arteries with only minimal luminal CAD.  Widely patent RCA stent 2. LVEF 70% without regional wall motion abnormalities 3. Mild PAH with normal cardiac output on low-dose dopamine 4. No intracardiac shunt 4. PAD - central aortic pressure ~280mG greater than radial artery pressure or cuff pressure   cMRI done to evaluate RV dysfunction - LVEF 77% No RV infarct. Possible infiltrative process but not classic amyloid process. No RV scar  SPEP UPEP negative. (cMRI reviewed with Dr. 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  Tobacco Use   Smoking status: Former    Packs/day: 0.50    Years: 47.00    Total pack years: 23.50    Types: Cigarettes    Quit date: 07/20/2016    Years since quitting: 5.2   Smokeless tobacco: Never  Vaping Use   Vaping Use: Never used  Substance and Sexual Activity   Alcohol use: Not Currently   Drug use: No   Sexual activity: Not Currently    Birth control/protection: Surgical    Comment: HYST-1st intercourse 76 yo-More than 5 partners  Other Topics Concern   Not on file  Social History Narrative   Not on file   Social Determinants of Health   Financial Resource Strain: Not on file  Food Insecurity: Not on file  Transportation Needs: Not on file  Physical Activity: Not on file  Stress: Not on file  Social Connections: Not on file  Intimate Partner Violence: Not on file    FH:  Family History  Problem Relation Age of Onset   Heart failure Mother    Stomach cancer Mother 65   Heart disease Father    Diabetes Father    Colon cancer Sister 77   Melanoma Sister    Heart disease Brother    Cancer Maternal Grandmother        unknown type    Past Medical History:  Diagnosis Date   Cancer ([place])    Breast  Cancer   Cataract    Cervical dysplasia    COPD (chronic obstructive pulmonary disease) ([place])    Coronary artery disease    Dyspnea    Hyperlipidemia    Hypertension    MI (myocardial infarction) ([place]) 01/2010   stent mid right coronary   Obesity    Tobacco dependence    VAIN (vaginal intraepithelial neoplasia) 2011   Efudex treatment    Current Outpatient Medications  Medication Sig Dispense Refill   albuterol (PROAIR HFA) 108 (90 Base) MCG/ACT inhaler 2 puffs every 4 hours as needed only  if your can't catch your breath 1 Inhaler 1   Ascorbic Acid (VITAMIN C) 100 MG tablet Take 100 mg by mouth daily.     aspirin EC 81 MG tablet Take 81 mg by mouth daily.     b complex vitamins tablet Take 1 tablet by mouth daily.     furosemide (LASIX) 40 MG tablet Take 1 tablet by mouth once daily 90 tablet 3   Ginger 500 MG CAPS Take 500 mg by mouth daily.     Honey 5 GM/5ML SYRP Take 5 mLs by mouth daily.     losartan (COZAAR) 50 MG tablet Take 1 tablet (50 mg total) by mouth daily. 90 tablet 3   nitroGLYCERIN (NITROSTAT) 0.4 MG SL tablet DISSOLVE ONE TABLET UNDER THE TONGUE EVERY 5 MINUTES AS NEEDED FOR CHEST PAIN. 25 tablet 1   NP THYROID 60 MG tablet Take 60 mg by mouth daily.     nystatin cream (MYCOSTATIN) Apply 1 application topically as needed for dry skin.     OXYGEN Inhale 2 L into the lungs at bedtime.     potassium chloride SA (KLOR-CON) 20 MEQ tablet Take 1 tablet by mouth once daily 30 tablet 11   No current facility-administered medications for this encounter.    Vitals:   11/02/21 1343  BP: (!) 160/60  Pulse: 80  SpO2: 94%  Weight: 72.8 kg (160 lb 6.4 oz)    Wt Readings from Last 3 Encounters:  11/02/21 72.8 kg (160 lb 6.4 oz)  04/19/21 72.1 kg (159 lb)  04/06/21 70.8 kg (156 lb)    PHYSICAL EXAM: General:  Well appearing. No resp difficulty HEENT: normal Neck: supple. no JVD. Carotids 2+ bilat; no bruits. No lymphadenopathy or thryomegaly appreciated. Cor: PMI  nondisplaced. Regular rate & rhythm. No rubs, gallops or murmurs. Lungs: clear decreased throughout Abdomen: obese soft, nontender, nondistended. No hepatosplenomegaly. No bruits or masses. Good bowel sounds. Extremities: no cyanosis, clubbing, rash, edema Neuro: alert & orientedx3, cranial nerves grossly intact. moves all 4 extremities  w/o difficulty. Affect pleasant   Hall walk: O2 sat 93% at rest 87% with hallwalk.   ECG: NSR 70 No ST-T wave abnormalities. Personally reviewed   ASSESSMENT & PLAN:  1. PAH/RV failure - Echo 11/2016 EF 55-60%, grade 1 DD, RV normal - Echo 8/20 EF 60-65% RV normal. - Echo 10/26/20 EF 65-70% RV normal.  - Suspect due to COPD and untreated OSA - No evidence of RV infarct or infiltrative process on cMRI 6/18 - Mild PAH on cath 6/18 - Stable NYHA III.  - Volume ok - Off spiro due to hyperkalemia. - Continue oxygen.    2. CAD - s/p inferior MI 2011 with RCA stents - cath with non-obstructive CAD in 6/18 (LAD 20%, dLCX 60%, RCA 30%) - No s/s ischemia - Continue ASA. Unable to tolerate statins or zetia. Goal LDL < 70 - I sent her to [facility] and she refused PCSK-9i   4. Tobacco use and chronic hypoxic respiratory failure - Quit smoking in July 19, 2016.  - Continue O2. Follows with Dr Melvyn Novas who recently released her to PRN f/u - Suspect she is more out of shape after COVID - With exertional desats have strongly suggested wearing O2 with activity  5. HTN - Has white coat syndrome - BP moderately elevated.  - No longer taking losartan due to "brain fog". Follow BP several times a week. If SBP is consistently > 140 -> let me know and we will try amlodipine  6. L breast CA - s/p L lupectomy    Glori Bickers, MD  2:19 PM

## 2021-11-02 NOTE — Addendum Note (Signed)
Encounter addended by: Jerl Mina, RN on: 11/02/2021 2:28 PM  Actions taken: Order list changed, Clinical Note Signed

## 2021-11-04 DIAGNOSIS — G720 Drug-induced myopathy: Secondary | ICD-10-CM | POA: Diagnosis not present

## 2021-11-04 DIAGNOSIS — I119 Hypertensive heart disease without heart failure: Secondary | ICD-10-CM | POA: Diagnosis not present

## 2021-11-04 DIAGNOSIS — I2510 Atherosclerotic heart disease of native coronary artery without angina pectoris: Secondary | ICD-10-CM | POA: Diagnosis not present

## 2021-11-04 DIAGNOSIS — E7800 Pure hypercholesterolemia, unspecified: Secondary | ICD-10-CM | POA: Diagnosis not present

## 2021-11-04 DIAGNOSIS — E039 Hypothyroidism, unspecified: Secondary | ICD-10-CM | POA: Diagnosis not present

## 2021-11-04 DIAGNOSIS — J449 Chronic obstructive pulmonary disease, unspecified: Secondary | ICD-10-CM | POA: Diagnosis not present

## 2021-11-15 ENCOUNTER — Other Ambulatory Visit (HOSPITAL_COMMUNITY): Payer: Self-pay | Admitting: Internal Medicine

## 2021-12-15 DIAGNOSIS — H61001 Unspecified perichondritis of right external ear: Secondary | ICD-10-CM | POA: Diagnosis not present

## 2021-12-28 ENCOUNTER — Other Ambulatory Visit: Payer: Self-pay | Admitting: Internal Medicine

## 2022-01-16 ENCOUNTER — Other Ambulatory Visit: Payer: Self-pay | Admitting: Obstetrics & Gynecology

## 2022-01-17 NOTE — Telephone Encounter (Signed)
Medication refill request: nystatin cream  Last AEX:  08/31/20  Next AEX: none scheduled  Last MMG (if hormonal medication request): 04/06/21 Refill authorized: 30g with 0 rf pended for today. Please advise

## 2022-01-24 ENCOUNTER — Other Ambulatory Visit: Payer: Self-pay | Admitting: Obstetrics & Gynecology

## 2022-02-03 ENCOUNTER — Ambulatory Visit
Admission: RE | Admit: 2022-02-03 | Discharge: 2022-02-03 | Disposition: A | Discharge status: Home / Self Care | Payer: Medicare Other | Source: Ambulatory Visit | Attending: Surgery | Admitting: Surgery

## 2022-02-03 DIAGNOSIS — R922 Inconclusive mammogram: Secondary | ICD-10-CM | POA: Diagnosis not present

## 2022-02-03 DIAGNOSIS — C50912 Malignant neoplasm of unspecified site of left female breast: Secondary | ICD-10-CM

## 2022-02-08 DIAGNOSIS — J449 Chronic obstructive pulmonary disease, unspecified: Secondary | ICD-10-CM | POA: Diagnosis not present

## 2022-03-11 DIAGNOSIS — J449 Chronic obstructive pulmonary disease, unspecified: Secondary | ICD-10-CM | POA: Diagnosis not present

## 2022-04-11 DIAGNOSIS — J449 Chronic obstructive pulmonary disease, unspecified: Secondary | ICD-10-CM | POA: Diagnosis not present

## 2022-04-12 IMAGING — MG MM PLC BREAST LOC DEV 1ST LESION INC MAMMO GUIDE*L*
8 of 9 series · 8 of 9 positions shown · non-contrast
Comparison: Previous exam(s).

CLINICAL DATA: Patient presents for seed localization of 2 sites in
the LEFT breast. Recent ultrasound-guided core biopsies show benign
discordant fibrosis a neutral ductal hyperplasia in the 12 o'clock
location and marked with a ribbon clip and grade 2 invasive ductal
carcinoma in the 11:30 o'clock location marked with a coil clip.

EXAM:
MAMMOGRAPHIC GUIDED RADIOACTIVE SEED LOCALIZATION OF THE LEFT BREAST
x2

[L ML (1 of 5)]
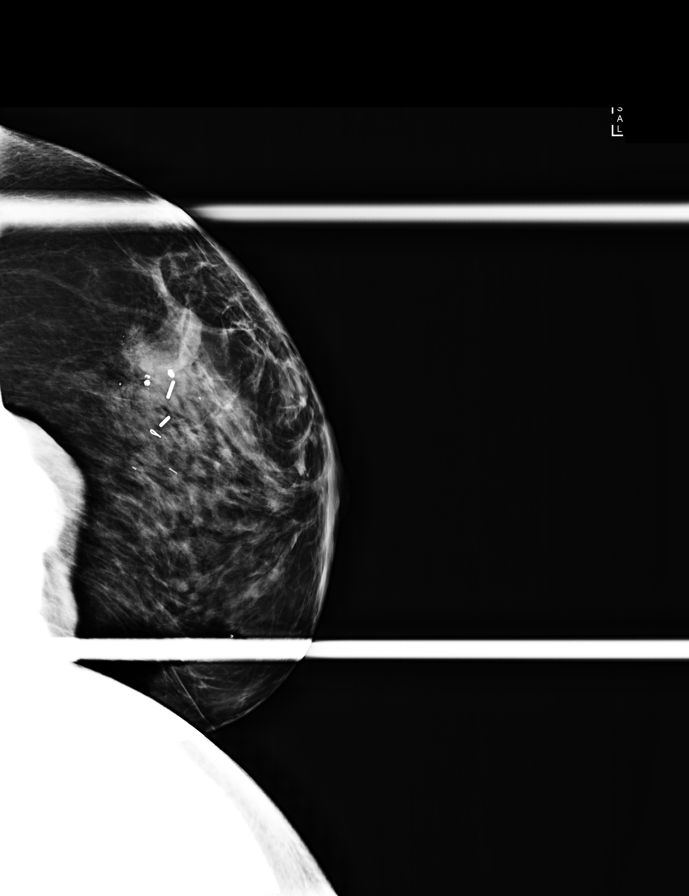

[L ML (2 of 5)]
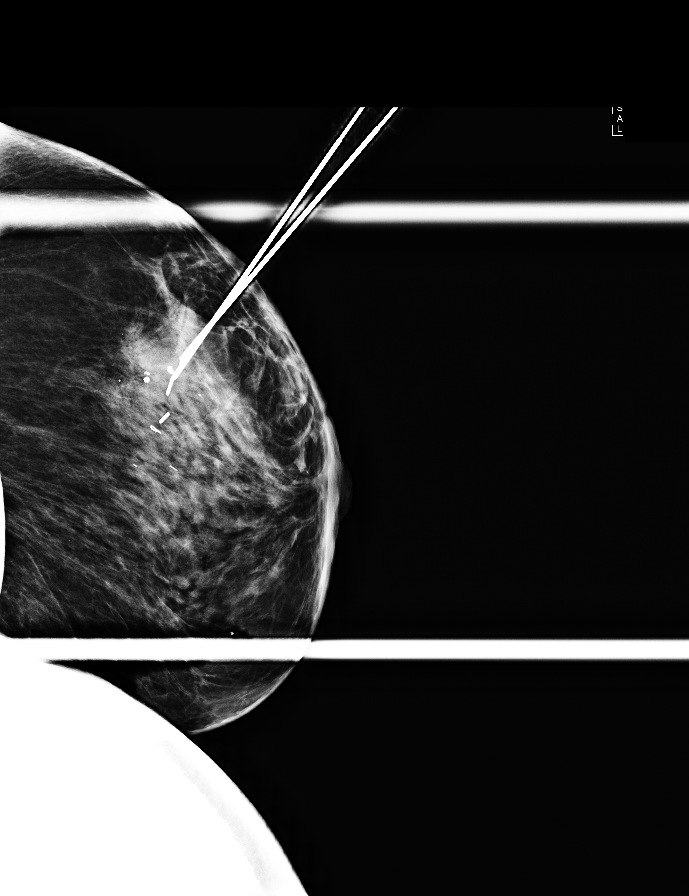

[L CC (1 of 3)]
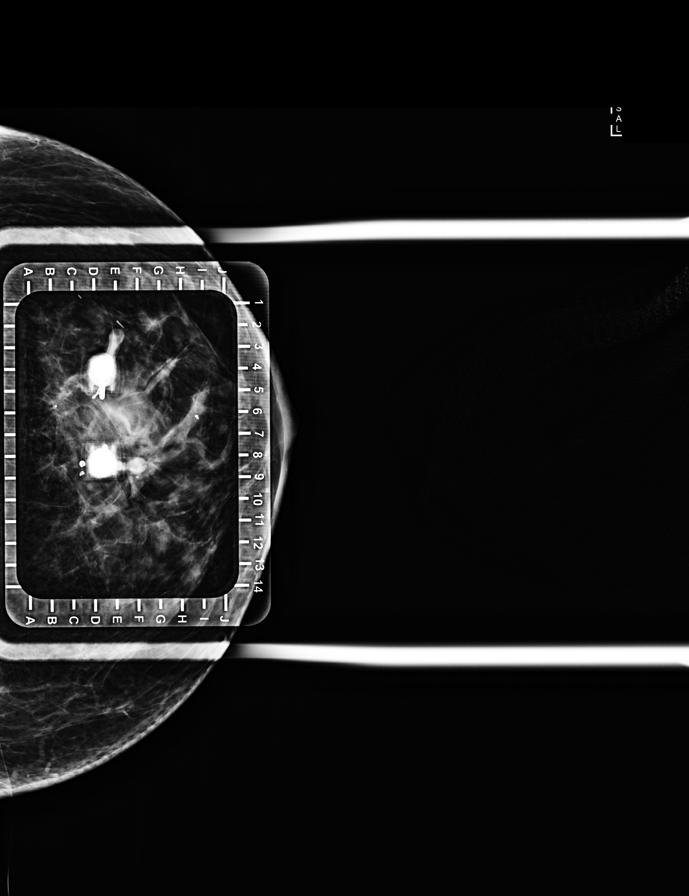

[L ML (3 of 5)]
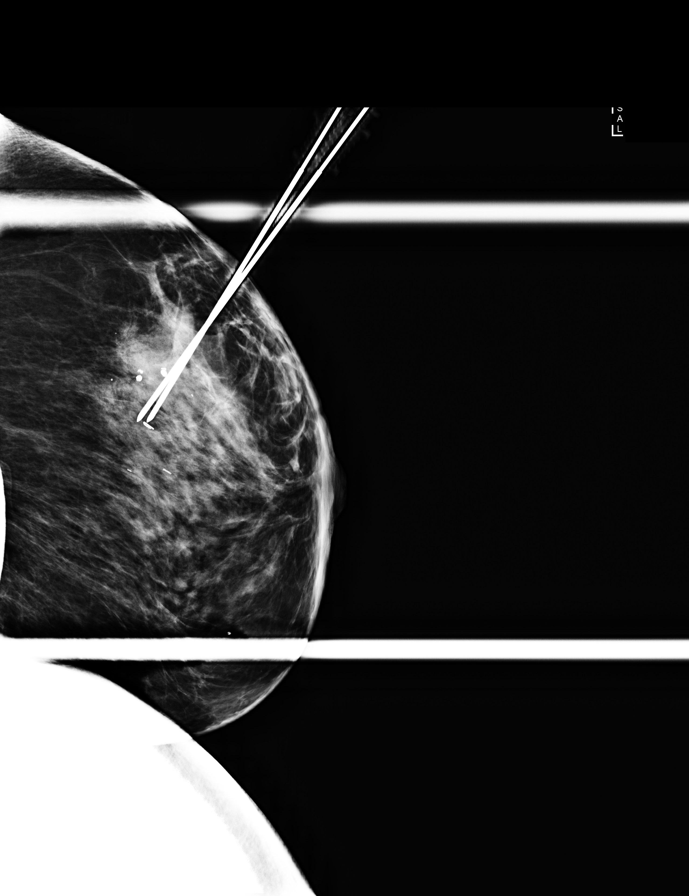

[L CC (2 of 3)]
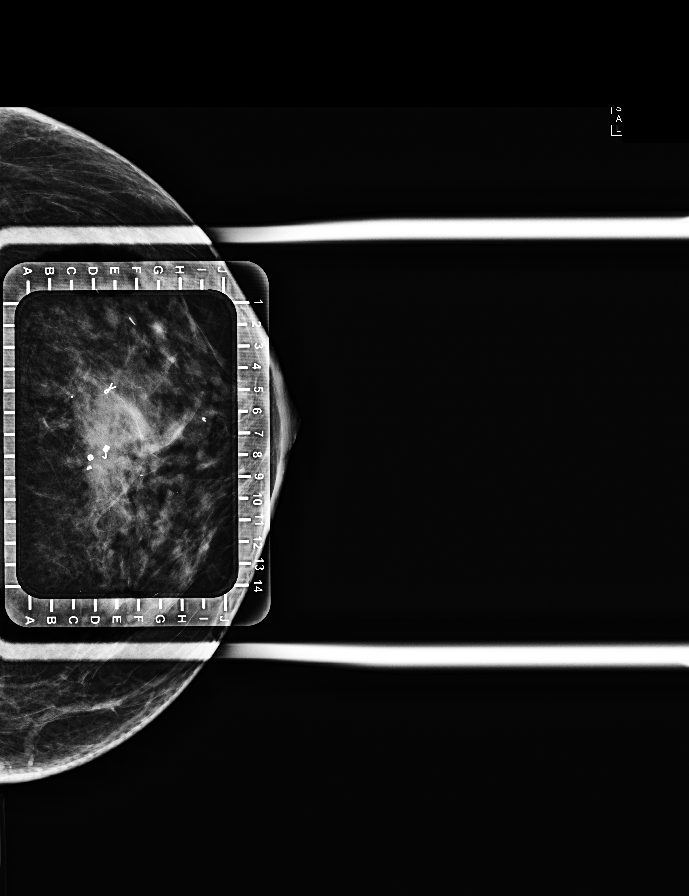

[L ML (4 of 5)]
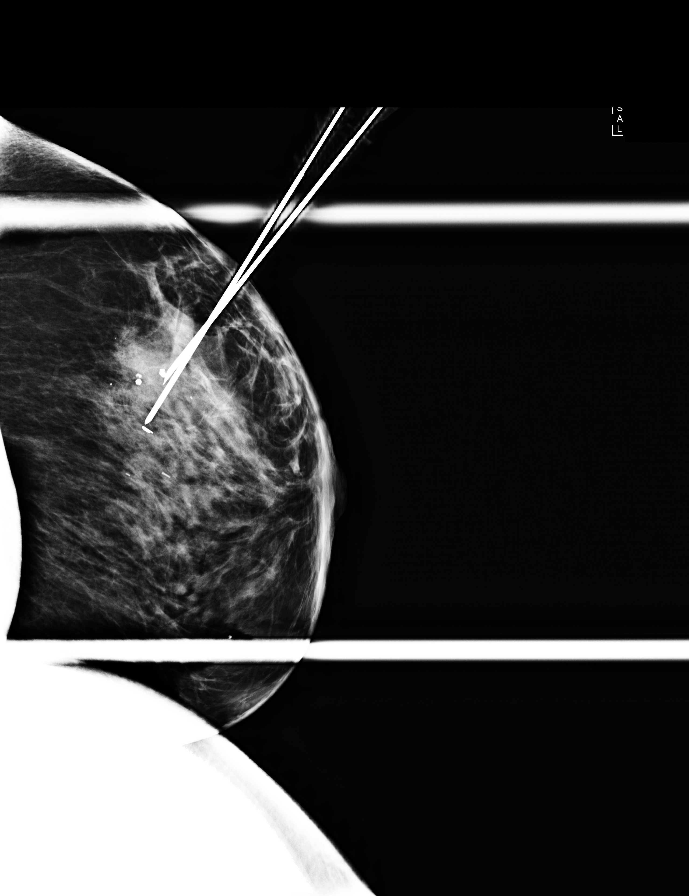

[L ML (5 of 5)]
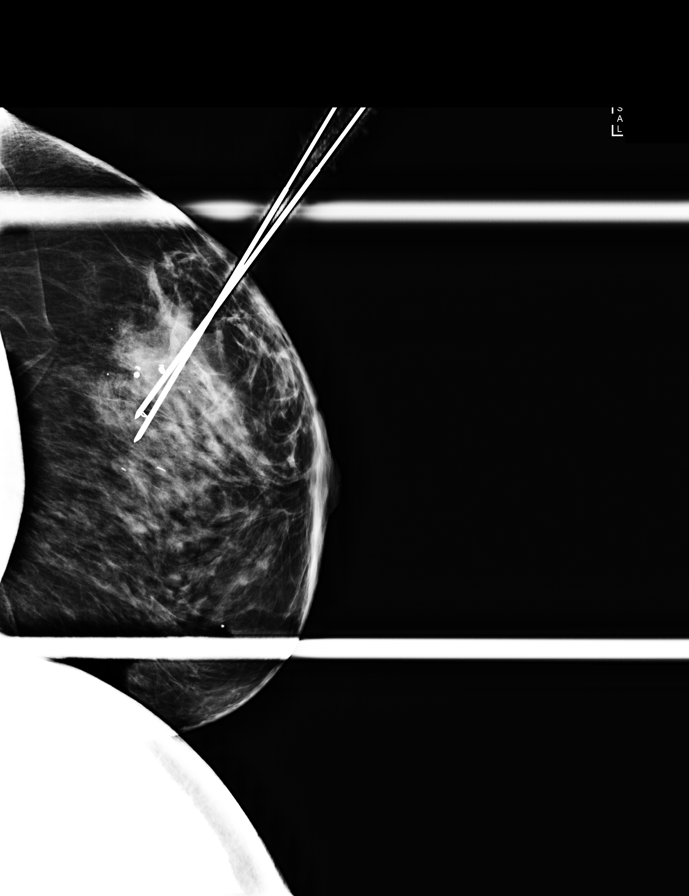

[L CC (3 of 3)]
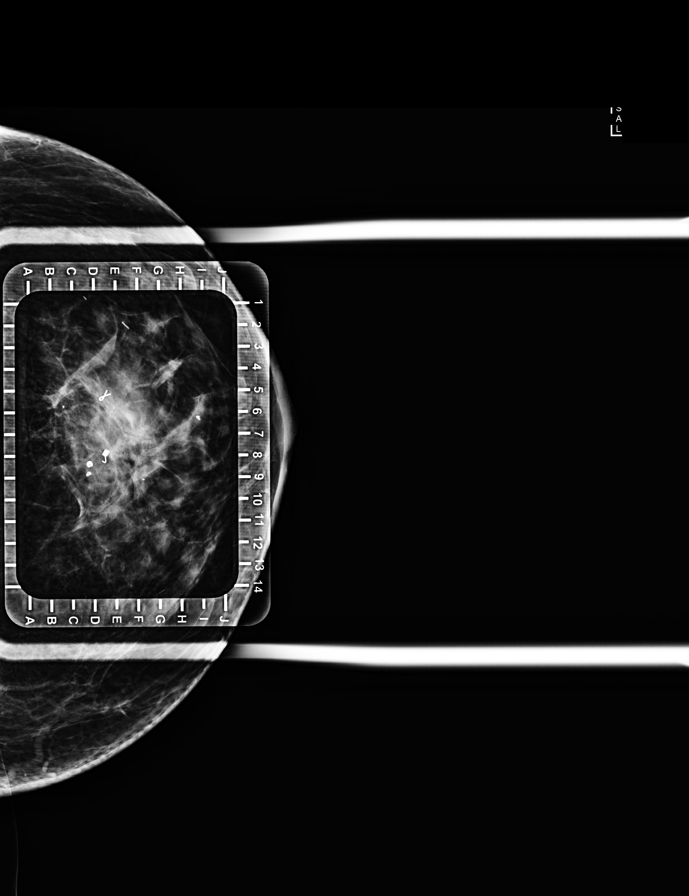

[8 of 9 positions shown; findings below may reference images not displayed]

FINDINGS: Patient presents for radioactive seed localization prior to
lumpectomy. I met with the patient and we discussed the procedure of
seed localization including benefits and alternatives. We discussed
the high likelihood of a successful procedure. We discussed the
risks of the procedure including infection, bleeding, tissue injury
and further surgery. We discussed the low dose of radioactivity
involved in the procedure. Informed, written consent was given.

The usual time-out protocol was performed immediately prior to the
procedure.

TiteJ:

Using mammographic guidance, sterile technique, 1% lidocaine and an
K-LL4 radioactive seed, the ribbon shaped clip was localized using a
craniocaudal approach. The follow-up mammogram images confirm the
seed in the expected location and were marked for Dr. Melegnaw.

Follow-up survey of the patient confirms presence of the radioactive
seed.

Order number of K-LL4 seed:  [id number].

Total activity:  0.245 millicuries reference Date: 03/23/2021

Site 2:

Using mammographic guidance, sterile technique, 1% lidocaine and an
K-LL4 radioactive seed, the coil shaped clip was localized using a
craniocaudal approach. The follow-up mammogram images confirm the
seed in the expected location and were marked for Dr. Melegnaw.

Follow-up survey of the patient confirms presence of the radioactive
seed.

Order number of K-LL4 seed:  [id number].

Total activity:  0.245 millicurie reference Date: 03/23/2021

The patient tolerated the procedure well and was released from the
[REDACTED]. She was given instructions regarding seed removal.
IMPRESSION: Radioactive seed localization of 2 sites in the LEFT breast. No
apparent complications.

## 2022-04-13 IMAGING — MG MM BREAST SURGICAL SPECIMEN
1 series · 1 of 1 positions shown · non-contrast
Comparison: Previous exam(s).

CLINICAL DATA: Evaluate surgical specimen following lumpectomy for
LEFT breast cancer and fibrosis.

EXAM:
SPECIMEN RADIOGRAPH OF THE LEFT BREAST

[L]
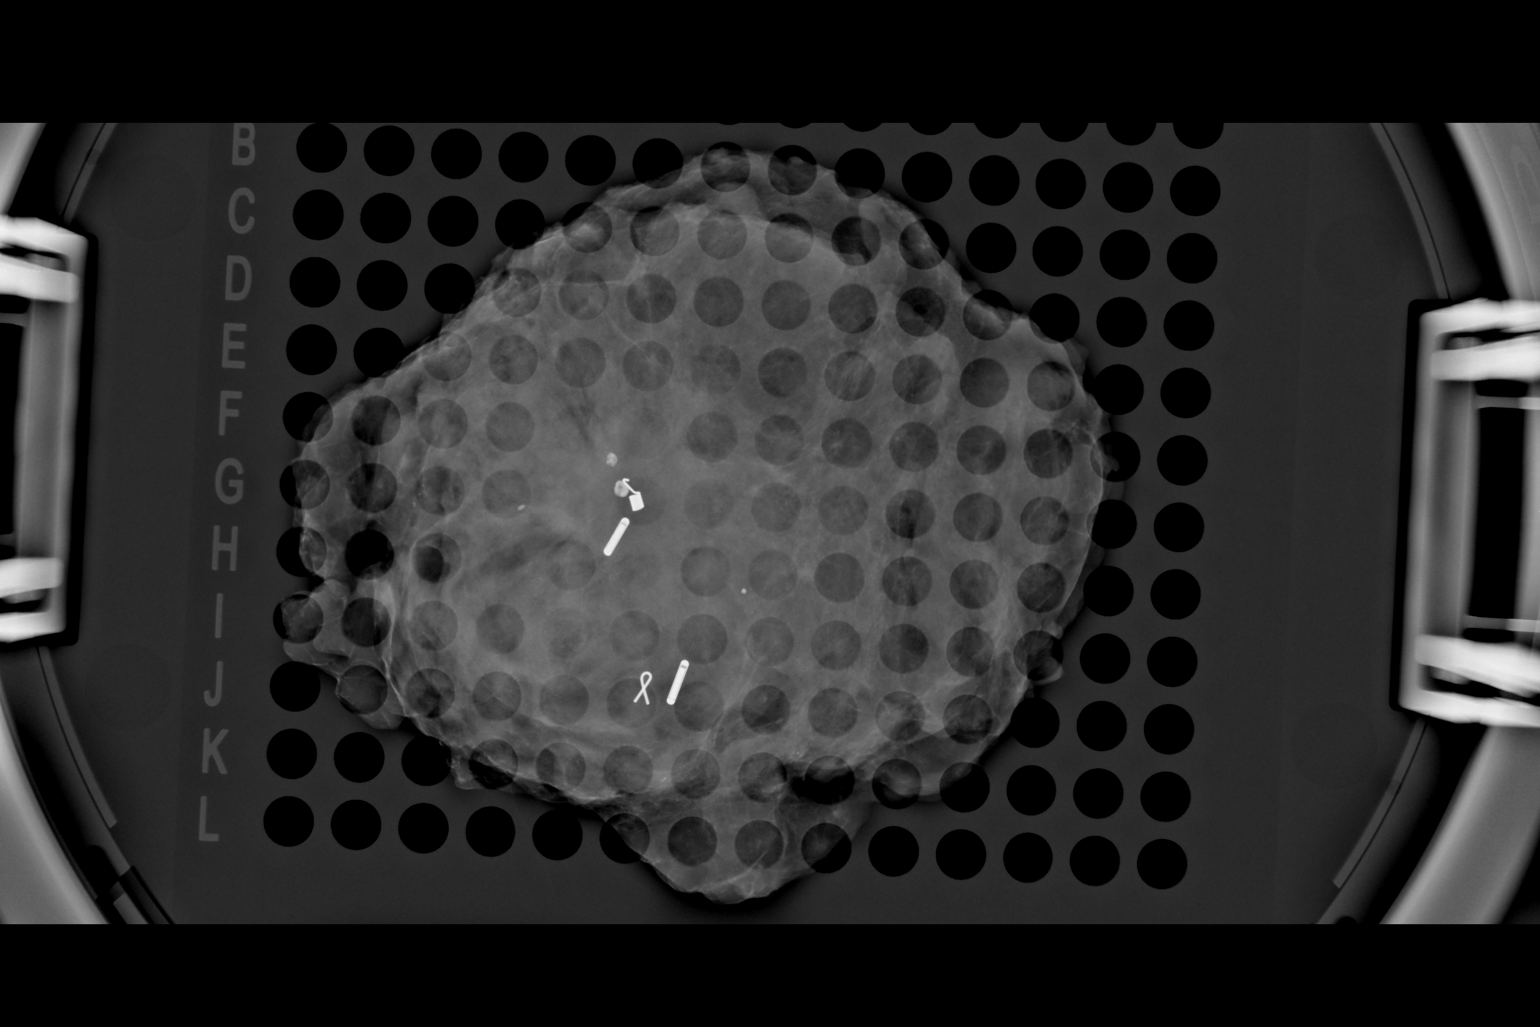

[1 of 1 positions shown; findings below may reference images not displayed]

FINDINGS: Status post excision of the LEFT breast. The 2 radioactive seeds,
COIL biopsy marker clip and RIBBON biopsy marker clip are present in
the single specimen, completely intact, and were marked for
pathology.
IMPRESSION: Specimen radiograph of the LEFT breast.

## 2022-04-24 DIAGNOSIS — C50912 Malignant neoplasm of unspecified site of left female breast: Secondary | ICD-10-CM | POA: Diagnosis not present

## 2022-05-09 ENCOUNTER — Other Ambulatory Visit (HOSPITAL_COMMUNITY): Payer: Self-pay | Admitting: Internal Medicine

## 2022-05-10 DIAGNOSIS — J449 Chronic obstructive pulmonary disease, unspecified: Secondary | ICD-10-CM | POA: Diagnosis not present

## 2022-05-18 DIAGNOSIS — E7800 Pure hypercholesterolemia, unspecified: Secondary | ICD-10-CM | POA: Diagnosis not present

## 2022-05-18 DIAGNOSIS — J9611 Chronic respiratory failure with hypoxia: Secondary | ICD-10-CM | POA: Diagnosis not present

## 2022-05-18 DIAGNOSIS — E039 Hypothyroidism, unspecified: Secondary | ICD-10-CM | POA: Diagnosis not present

## 2022-05-18 DIAGNOSIS — I110 Hypertensive heart disease with heart failure: Secondary | ICD-10-CM | POA: Diagnosis not present

## 2022-05-18 DIAGNOSIS — C50912 Malignant neoplasm of unspecified site of left female breast: Secondary | ICD-10-CM | POA: Diagnosis not present

## 2022-05-18 DIAGNOSIS — I10 Essential (primary) hypertension: Secondary | ICD-10-CM | POA: Diagnosis not present

## 2022-05-18 DIAGNOSIS — I509 Heart failure, unspecified: Secondary | ICD-10-CM | POA: Diagnosis not present

## 2022-05-18 DIAGNOSIS — I2721 Secondary pulmonary arterial hypertension: Secondary | ICD-10-CM | POA: Diagnosis not present

## 2022-05-18 DIAGNOSIS — J449 Chronic obstructive pulmonary disease, unspecified: Secondary | ICD-10-CM | POA: Diagnosis not present

## 2022-05-31 DIAGNOSIS — I119 Hypertensive heart disease without heart failure: Secondary | ICD-10-CM | POA: Diagnosis not present

## 2022-05-31 DIAGNOSIS — E039 Hypothyroidism, unspecified: Secondary | ICD-10-CM | POA: Diagnosis not present

## 2022-05-31 DIAGNOSIS — Z1211 Encounter for screening for malignant neoplasm of colon: Secondary | ICD-10-CM | POA: Diagnosis not present

## 2022-05-31 DIAGNOSIS — I2510 Atherosclerotic heart disease of native coronary artery without angina pectoris: Secondary | ICD-10-CM | POA: Diagnosis not present

## 2022-05-31 DIAGNOSIS — Z0000 Encounter for general adult medical examination without abnormal findings: Secondary | ICD-10-CM | POA: Diagnosis not present

## 2022-05-31 DIAGNOSIS — Z136 Encounter for screening for cardiovascular disorders: Secondary | ICD-10-CM | POA: Diagnosis not present

## 2022-06-10 DIAGNOSIS — J449 Chronic obstructive pulmonary disease, unspecified: Secondary | ICD-10-CM | POA: Diagnosis not present

## 2022-06-30 DIAGNOSIS — E039 Hypothyroidism, unspecified: Secondary | ICD-10-CM | POA: Diagnosis not present

## 2022-06-30 DIAGNOSIS — Z1211 Encounter for screening for malignant neoplasm of colon: Secondary | ICD-10-CM | POA: Diagnosis not present

## 2022-07-10 DIAGNOSIS — J449 Chronic obstructive pulmonary disease, unspecified: Secondary | ICD-10-CM | POA: Diagnosis not present

## 2022-08-09 ENCOUNTER — Other Ambulatory Visit: Payer: Self-pay | Admitting: Internal Medicine

## 2022-08-10 DIAGNOSIS — J449 Chronic obstructive pulmonary disease, unspecified: Secondary | ICD-10-CM | POA: Diagnosis not present

## 2022-09-09 DIAGNOSIS — J449 Chronic obstructive pulmonary disease, unspecified: Secondary | ICD-10-CM | POA: Diagnosis not present

## 2022-10-10 DIAGNOSIS — J449 Chronic obstructive pulmonary disease, unspecified: Secondary | ICD-10-CM | POA: Diagnosis not present

## 2022-10-18 DIAGNOSIS — I2510 Atherosclerotic heart disease of native coronary artery without angina pectoris: Secondary | ICD-10-CM | POA: Diagnosis not present

## 2022-10-18 DIAGNOSIS — I2721 Secondary pulmonary arterial hypertension: Secondary | ICD-10-CM | POA: Diagnosis not present

## 2022-10-18 DIAGNOSIS — E039 Hypothyroidism, unspecified: Secondary | ICD-10-CM | POA: Diagnosis not present

## 2022-10-18 DIAGNOSIS — I119 Hypertensive heart disease without heart failure: Secondary | ICD-10-CM | POA: Diagnosis not present

## 2022-10-18 DIAGNOSIS — Z9989 Dependence on other enabling machines and devices: Secondary | ICD-10-CM | POA: Diagnosis not present

## 2022-10-18 DIAGNOSIS — I110 Hypertensive heart disease with heart failure: Secondary | ICD-10-CM | POA: Diagnosis not present

## 2022-10-18 DIAGNOSIS — J449 Chronic obstructive pulmonary disease, unspecified: Secondary | ICD-10-CM | POA: Diagnosis not present

## 2022-10-18 DIAGNOSIS — I509 Heart failure, unspecified: Secondary | ICD-10-CM | POA: Diagnosis not present

## 2022-10-18 DIAGNOSIS — J9611 Chronic respiratory failure with hypoxia: Secondary | ICD-10-CM | POA: Diagnosis not present

## 2022-11-13 ENCOUNTER — Ambulatory Visit (HOSPITAL_COMMUNITY)
Admission: RE | Admit: 2022-11-13 | Discharge: 2022-11-13 | Disposition: A | Discharge status: Home / Self Care | Payer: Medicare Other | Source: Ambulatory Visit | Attending: Internal Medicine | Admitting: Internal Medicine

## 2022-11-13 VITALS — BP 162/84 | HR 75 | Wt 160.6 lb

## 2022-11-13 DIAGNOSIS — I252 Old myocardial infarction: Secondary | ICD-10-CM | POA: Diagnosis not present

## 2022-11-13 DIAGNOSIS — I2721 Secondary pulmonary arterial hypertension: Secondary | ICD-10-CM | POA: Diagnosis not present

## 2022-11-13 DIAGNOSIS — I10 Essential (primary) hypertension: Secondary | ICD-10-CM | POA: Diagnosis not present

## 2022-11-13 DIAGNOSIS — Z853 Personal history of malignant neoplasm of breast: Secondary | ICD-10-CM | POA: Diagnosis not present

## 2022-11-13 DIAGNOSIS — I50810 Right heart failure, unspecified: Secondary | ICD-10-CM | POA: Diagnosis not present

## 2022-11-13 DIAGNOSIS — J9611 Chronic respiratory failure with hypoxia: Secondary | ICD-10-CM | POA: Insufficient documentation

## 2022-11-13 DIAGNOSIS — I5022 Chronic systolic (congestive) heart failure: Secondary | ICD-10-CM | POA: Diagnosis not present

## 2022-11-13 DIAGNOSIS — J449 Chronic obstructive pulmonary disease, unspecified: Secondary | ICD-10-CM | POA: Insufficient documentation

## 2022-11-13 DIAGNOSIS — Z955 Presence of coronary angioplasty implant and graft: Secondary | ICD-10-CM | POA: Diagnosis not present

## 2022-11-13 DIAGNOSIS — I2510 Atherosclerotic heart disease of native coronary artery without angina pectoris: Secondary | ICD-10-CM | POA: Insufficient documentation

## 2022-11-13 DIAGNOSIS — R030 Elevated blood-pressure reading, without diagnosis of hypertension: Secondary | ICD-10-CM | POA: Insufficient documentation

## 2022-11-13 DIAGNOSIS — R9431 Abnormal electrocardiogram [ECG] [EKG]: Secondary | ICD-10-CM | POA: Diagnosis not present

## 2022-11-13 DIAGNOSIS — Z87891 Personal history of nicotine dependence: Secondary | ICD-10-CM | POA: Insufficient documentation

## 2022-11-13 NOTE — Patient Instructions (Signed)
Medication Changes:  None, continue current medications  Lab Work:  noen  Testing/Procedures:  Your physician has requested that you have an echocardiogram. Echocardiography is a painless test that uses sound waves to create images of your heart. It provides your doctor with information about the size and shape of your heart and how well your heart's chambers and valves are working. This procedure takes approximately one hour. There are no restrictions for this procedure. Please do NOT wear cologne, perfume, aftershave, or lotions (deodorant is allowed). Please arrive 15 minutes prior to your appointment time.   Referrals:  none  Special Instructions // Education:  Do the following things EVERYDAY: Weigh yourself in the morning before breakfast. Write it down and keep it in a log. Take your medicines as prescribed Eat low salt foods--Limit salt (sodium) to 2000 mg per day.  Stay as active as you can everyday Limit all fluids for the day to less than 2 liters   Follow-Up in: 1 year (Sept 2025), **PLEASE CALL OUR OFFICE IN JULY TO SCHEDULE THIS APPOINTMENT   At the [facility], you and your health needs are our priority. We have a designated team specialized in the treatment of Heart Failure. This Care Team includes your primary Heart Failure Specialized Cardiologist (physician), Advanced Practice Providers (APPs- Physician Assistants and Nurse Practitioners), and Pharmacist who all work together to provide you with the care you need, when you need it.   You may see any of the following providers on your designated Care Team at your next follow up:  Dr. Arvilla Meres Dr. Marca Ancona Dr. Marcos Eke, NP Robbie Lis, [place] [facility] [place], [place] Brynda Peon, NP Karle Plumber, PharmD   Please be sure to bring in all your medications bottles to every appointment.   Need to Contact us:  If you have any questions or  concerns before your next appointment please send [place] a message through [place] or call our office at [phone].    TO LEAVE A MESSAGE FOR THE NURSE SELECT OPTION 2, PLEASE LEAVE A MESSAGE INCLUDING: YOUR NAME DATE OF BIRTH CALL BACK NUMBER REASON FOR CALL**this is important as we prioritize the call backs  YOU WILL RECEIVE A CALL BACK THE SAME DAY AS LONG AS YOU CALL BEFORE 4:00 PM

## 2022-11-13 NOTE — Progress Notes (Signed)
HF: Dr Gala Romney Pulmonary : Dr Sherene Sires PCP: Dr Maurice Small   ADVANCED HF CLINIC NOTE  HPI: Deanna Johnston) is a 77 y/o female with PMH of COPD with ongoing tobacco use, CAD s/p NSTEMI with RCA stents in 2011 and obesity.   Admitted to [facility] 6/18 with PNA and severe cor pulmonale. Echo 07/21/16: LV EF 65-70% with dilated RV and moderate RV HK. Started on dopamine for RV support. CT chest no PE. Diuresed well and renal function improved. Cath with mild PAH after optimization.   Echo 8/20 EF 60-65% RV normal.  Echo 10/26/20 EF 65-70% RV normal.   Diagnosed 1/23 with left breast CA stage 1A. ER+, PR+, HER2: Equivocal. Underwent left lumpectomy in 2/23. Refused XRT. Tried anastrazole but stopped due to SOB.   Here for f/u.Follows BP at home typically 130s/60-70.Gets around and does ok. Main limitation is back pain.  Wears 2L O2 at night but not otherwise. Sats 95% at rest and down to 90% with walking. Doesn't check with exertion. Denies CP, presyncope or PND. SOB with mild activity    Testing   PFTS 8/18 FEV1  1.2 (74%) FVC   1.9 87%)  FEF 25-75 45% DLCO 58% Moderate obstruction with moderate to severe diffusion defect.   Echo 07/21/16: LV EF 65-70% with dilated RV and moderate RV HK. Started on dopamine for RV support. CT chest no PE. Diuresed well and renal function improved.  11/2016  LV function remains normal. RV now improved with normal size and function.  Underwent RHC/LHC on 07/26/16  Mid RCA to Dist RCA lesion, 0 %stenosed. Mid RCA lesion, 20 %stenosed. Dist RCA lesion, 30 %stenosed. Dist Cx lesion, 60 %stenosed. Prox LAD to Mid LAD lesion, 20 %stenosed. The left ventricular ejection fraction is greater than 65% by visual estimate.  Findings:  Done on dopamine 59mcg/kg/min  Ao = 110/47 (72) LV = 120/10 RA = 5 RV = 47/7 PA = 42/16 (27) PCW = 6 Fick cardiac output/index = 5.7/3.4 PVR = 4.2 WU SVR 945 Ao sat =  88% PA sat = 71%, 69% High SVC sat = 69%   Assessment: 1. Heavily calcified coronary arteries with only minimal luminal CAD. Widely patent RCA stent 2. LVEF 70% without regional wall motion abnormalities 3. Mild PAH with normal cardiac output on low-dose dopamine 4. No intracardiac shunt 4. PAD - central aortic pressure ~60mmHG greater than radial artery pressure or cuff pressure   cMRI done to evaluate RV dysfunction - LVEF 77% No RV infarct. Possible infiltrative process but not classic amyloid process. No RV scar  SPEP UPEP negative. (cMRI reviewed with Dr. Delton See and not felt to have infiltrative process)  SH:  Social History   Socioeconomic History   Marital status: Single    Spouse name: Not on file   Number of children: Not on file   Years of education: Not on file   Highest education level: Not on file  Occupational History   Occupation: Retired    Associate Professor: RETIRED  Tobacco Use   Smoking status: Former    Current packs/day: 0.00    Average packs/day: 0.5 packs/day for 47.0 years (23.5 ttl pk-yrs)    Types: Cigarettes    Start date: 07/20/1969    Quit date: 07/20/2016    Years since quitting: 6.3   Smokeless tobacco: Never  Vaping Use   Vaping status: Never Used  Substance and Sexual Activity   Alcohol use: Not Currently   Drug use: No  Sexual activity: Not Currently    Birth control/protection: Surgical    Comment: HYST-1st intercourse 77 yo-More than 5 partners  Other Topics Concern   Not on file  Social History Narrative   Not on file   Social Determinants of Health   Financial Resource Strain: Not on file  Food Insecurity: Not on file  Transportation Needs: Not on file  Physical Activity: Not on file  Stress: Not on file  Social Connections: Not on file  Intimate Partner Violence: Not on file    FH:  Family History  Problem Relation Age of Onset   Heart failure Mother    Stomach cancer Mother 29   Heart disease Father    Diabetes Father    Colon cancer Sister 3   Melanoma Sister     Heart disease Brother    Cancer Maternal Grandmother        unknown type    Past Medical History:  Diagnosis Date   Cancer (HCC)    Breast Cancer   Cataract    Cervical dysplasia    COPD (chronic obstructive pulmonary disease) (HCC)    Coronary artery disease    Dyspnea    Hyperlipidemia    Hypertension    MI (myocardial infarction) (HCC) 01/2010   stent mid right coronary   Obesity    Tobacco dependence    VAIN (vaginal intraepithelial neoplasia) 2011   Efudex treatment    Current Outpatient Medications  Medication Sig Dispense Refill   albuterol (PROAIR HFA) 108 (90 Base) MCG/ACT inhaler 2 puffs every 4 hours as needed only  if your can't catch your breath 1 Inhaler 1   Ascorbic Acid (VITAMIN C) 100 MG tablet Take 100 mg by mouth daily.     b complex vitamins tablet Take 1 tablet by mouth daily.     furosemide (LASIX) 40 MG tablet Take 1 tablet by mouth once daily 90 tablet 3   Ginger 500 MG CAPS Take 500 mg by mouth daily.     Honey 5 GM/5ML SYRP Take 5 mLs by mouth daily.     nitroGLYCERIN (NITROSTAT) 0.4 MG SL tablet PLACE 1 TABLET UNDER THE TONGUE EVERY 5 MINUTES AS NEEDED FOR CHEST PAIN 25 tablet 3   NP THYROID 60 MG tablet Take 60 mg by mouth daily.     nystatin cream (MYCOSTATIN) APPLY 1 APPLICATION TOPICALLY TWICE DAILY 30 g 0   OXYGEN Inhale 2 L into the lungs at bedtime.     potassium chloride SA (KLOR-CON M) 20 MEQ tablet Take 1 tablet by mouth once daily 90 tablet 3   aspirin EC 81 MG tablet Take 81 mg by mouth daily.     No current facility-administered medications for this encounter.    Vitals:   11/13/22 0927  BP: (!) 162/84  Pulse: 75  SpO2: 96%  Weight: 72.8 kg (160 lb 9.6 oz)     Wt Readings from Last 3 Encounters:  11/13/22 72.8 kg (160 lb 9.6 oz)  11/02/21 72.8 kg (160 lb 6.4 oz)  04/19/21 72.1 kg (159 lb)    PHYSICAL EXAM: General:  Elderly No resp difficulty HEENT: normal Neck: supple. no JVD. Carotids 2+ bilat; no bruits. No  lymphadenopathy or thryomegaly appreciated. Cor: PMI nondisplaced. Regular rate & rhythm. No rubs, gallops or murmurs. Lungs: clear but decreased throughout  Abdomen: soft, nontender, nondistended. No hepatosplenomegaly. No bruits or masses. Good bowel sounds. Extremities: no cyanosis, clubbing, rash, tr edema Neuro: alert & orientedx3, cranial nerves  grossly intact. moves all 4 extremities w/o difficulty. Affect pleasant  Hall walk: O2 sat 93% at rest 87% with hallwalk.   ECG: NSR 69 Non-specific ST-T wave abnormalities. +1 PVC Personally reviewed   ASSESSMENT & PLAN:  1. PAH/RV failure - Echo 11/2016 EF 55-60%, grade 1 DD, RV normal - Echo 8/20 EF 60-65% RV normal. - Echo 10/26/20 EF 65-70% RV normal.  - Suspect due to COPD and untreated OSA - No evidence of RV infarct or infiltrative process on cMRI 6/18 - Mild PAH on cath 6/18 - Stable NYHA III.  - Volume ok - Off spiro due to hyperkalemia. - Encouraged her to wear O2 with exertion and track sats - Refuses CPAP     - Repeat echo  2. CAD - s/p inferior MI 2011 with RCA stents - cath with non-obstructive CAD in 6/18 (LAD 20%, dLCX 60%, RCA 30%) - No s/s ischemia - Continue ASA. Unable to tolerate statins or zetia. Goal LDL < 70 - I sent her to [facility] and she refused PCSK-9i   4. Tobacco use and chronic hypoxic respiratory failure - Quit smoking in July 19, 2016.  - Continue O2. Follows with Dr Sherene Sires who recently released her to PRN f/u - With exertional desats have strongly suggested wearing O2 with activity  5. HTN - Has white coat syndrome - BP moderately elevated here but ok at home .  - No longer taking losartan due to "brain fog". Follow BP several times a week. If SBP is consistently > 140 -> let me know and we will try amlodipine  6. L breast CA - s/p L lupectomy    Arvilla Meres, MD  10:02 AM

## 2022-11-13 NOTE — Addendum Note (Signed)
Encounter addended by: Noralee Space, RN on: 11/13/2022 10:13 AM  Actions taken: Order list changed, Diagnosis association updated, Clinical Note Signed

## 2022-11-21 ENCOUNTER — Other Ambulatory Visit (HOSPITAL_COMMUNITY): Payer: Self-pay | Admitting: Internal Medicine

## 2022-12-11 ENCOUNTER — Ambulatory Visit (HOSPITAL_COMMUNITY)
Admission: RE | Admit: 2022-12-11 | Discharge: 2022-12-11 | Disposition: A | Discharge status: Home / Self Care | Payer: Medicare Other | Source: Ambulatory Visit | Attending: Internal Medicine | Admitting: Internal Medicine

## 2022-12-11 ENCOUNTER — Telehealth (HOSPITAL_COMMUNITY): Payer: Self-pay | Admitting: Cardiology

## 2022-12-11 DIAGNOSIS — I2510 Atherosclerotic heart disease of native coronary artery without angina pectoris: Secondary | ICD-10-CM | POA: Diagnosis not present

## 2022-12-11 DIAGNOSIS — J449 Chronic obstructive pulmonary disease, unspecified: Secondary | ICD-10-CM | POA: Diagnosis not present

## 2022-12-11 DIAGNOSIS — I081 Rheumatic disorders of both mitral and tricuspid valves: Secondary | ICD-10-CM | POA: Insufficient documentation

## 2022-12-11 DIAGNOSIS — I509 Heart failure, unspecified: Secondary | ICD-10-CM | POA: Insufficient documentation

## 2022-12-11 DIAGNOSIS — I5022 Chronic systolic (congestive) heart failure: Secondary | ICD-10-CM

## 2022-12-11 DIAGNOSIS — I252 Old myocardial infarction: Secondary | ICD-10-CM | POA: Insufficient documentation

## 2022-12-11 LAB — ECHOCARDIOGRAM COMPLETE
Area-P 1/2: 3.65 cm2
Calc EF: 67.2 %
S' Lateral: 2.9 cm
Single Plane A2C EF: 68.8 %
Single Plane A4C EF: 69 %

## 2022-12-11 NOTE — Telephone Encounter (Signed)
[facility] Pt would like to know if she can start yucca root supplements?

## 2022-12-12 NOTE — Telephone Encounter (Signed)
She can take yucca root supplements, there are no drug-drug interactions or drug-disease interactions. Called patient and she is aware.

## 2022-12-25 ENCOUNTER — Other Ambulatory Visit: Payer: Self-pay | Admitting: Internal Medicine

## 2022-12-25 DIAGNOSIS — Z9889 Other specified postprocedural states: Secondary | ICD-10-CM

## 2023-02-05 ENCOUNTER — Inpatient Hospital Stay: Admission: RE | Admit: 2023-02-05 | Payer: Medicare Other | Source: Ambulatory Visit

## 2023-02-09 ENCOUNTER — Ambulatory Visit
Admission: RE | Admit: 2023-02-09 | Discharge: 2023-02-09 | Disposition: A | Discharge status: Home / Self Care | Payer: Medicare Other | Source: Ambulatory Visit | Attending: Internal Medicine

## 2023-02-09 DIAGNOSIS — Z9889 Other specified postprocedural states: Secondary | ICD-10-CM

## 2023-02-09 DIAGNOSIS — Z853 Personal history of malignant neoplasm of breast: Secondary | ICD-10-CM | POA: Diagnosis not present

## 2023-02-20 ENCOUNTER — Other Ambulatory Visit (HOSPITAL_COMMUNITY): Payer: Self-pay | Admitting: Internal Medicine

## 2023-04-30 DIAGNOSIS — C50912 Malignant neoplasm of unspecified site of left female breast: Secondary | ICD-10-CM | POA: Diagnosis not present

## 2023-05-23 DIAGNOSIS — E559 Vitamin D deficiency, unspecified: Secondary | ICD-10-CM | POA: Diagnosis not present

## 2023-05-23 DIAGNOSIS — I2721 Secondary pulmonary arterial hypertension: Secondary | ICD-10-CM | POA: Diagnosis not present

## 2023-05-23 DIAGNOSIS — I509 Heart failure, unspecified: Secondary | ICD-10-CM | POA: Diagnosis not present

## 2023-05-23 DIAGNOSIS — Z0000 Encounter for general adult medical examination without abnormal findings: Secondary | ICD-10-CM | POA: Diagnosis not present

## 2023-05-23 DIAGNOSIS — I119 Hypertensive heart disease without heart failure: Secondary | ICD-10-CM | POA: Diagnosis not present

## 2023-05-23 DIAGNOSIS — Z853 Personal history of malignant neoplasm of breast: Secondary | ICD-10-CM | POA: Diagnosis not present

## 2023-05-23 DIAGNOSIS — M810 Age-related osteoporosis without current pathological fracture: Secondary | ICD-10-CM | POA: Diagnosis not present

## 2023-05-23 DIAGNOSIS — G720 Drug-induced myopathy: Secondary | ICD-10-CM | POA: Diagnosis not present

## 2023-05-23 DIAGNOSIS — E7800 Pure hypercholesterolemia, unspecified: Secondary | ICD-10-CM | POA: Diagnosis not present

## 2023-05-23 DIAGNOSIS — I2510 Atherosclerotic heart disease of native coronary artery without angina pectoris: Secondary | ICD-10-CM | POA: Diagnosis not present

## 2023-05-23 DIAGNOSIS — J449 Chronic obstructive pulmonary disease, unspecified: Secondary | ICD-10-CM | POA: Diagnosis not present

## 2023-05-23 DIAGNOSIS — E039 Hypothyroidism, unspecified: Secondary | ICD-10-CM | POA: Diagnosis not present

## 2023-06-21 DIAGNOSIS — E039 Hypothyroidism, unspecified: Secondary | ICD-10-CM | POA: Diagnosis not present

## 2023-08-29 ENCOUNTER — Other Ambulatory Visit: Payer: Self-pay | Admitting: Internal Medicine

## 2023-09-11 DIAGNOSIS — E039 Hypothyroidism, unspecified: Secondary | ICD-10-CM | POA: Diagnosis not present

## 2023-09-18 ENCOUNTER — Other Ambulatory Visit: Payer: Self-pay | Admitting: Internal Medicine

## 2023-10-04 ENCOUNTER — Telehealth: Payer: Self-pay

## 2023-10-04 ENCOUNTER — Other Ambulatory Visit: Payer: Self-pay

## 2023-10-04 DIAGNOSIS — J449 Chronic obstructive pulmonary disease, unspecified: Secondary | ICD-10-CM

## 2023-10-04 NOTE — Telephone Encounter (Signed)
 DME has faxed a request to order pt stationary O2 2 l hs, pt has not been seen since 2023, dr wert mention he will write order but pt definitely needs appt with us .

## 2023-10-08 ENCOUNTER — Telehealth: Payer: Self-pay | Admitting: Internal Medicine

## 2023-10-08 NOTE — Telephone Encounter (Signed)
 Per Avelina at [employer]  Pt has 5L concentrator and home fill. I have called the Pt x2 to see if everything was in working conditions. The phone beeps once and then disconnects.Just a FYI

## 2023-11-09 ENCOUNTER — Encounter (HOSPITAL_COMMUNITY): Admitting: Internal Medicine

## 2023-11-14 ENCOUNTER — Other Ambulatory Visit: Payer: Self-pay | Admitting: Internal Medicine

## 2023-11-14 DIAGNOSIS — Z9889 Other specified postprocedural states: Secondary | ICD-10-CM

## 2023-11-15 DIAGNOSIS — H00022 Hordeolum internum right lower eyelid: Secondary | ICD-10-CM | POA: Diagnosis not present

## 2023-11-19 ENCOUNTER — Encounter (HOSPITAL_COMMUNITY): Admitting: Internal Medicine

## 2023-11-19 DIAGNOSIS — H0015 Chalazion left lower eyelid: Secondary | ICD-10-CM | POA: Diagnosis not present

## 2023-11-19 DIAGNOSIS — H0288A Meibomian gland dysfunction right eye, upper and lower eyelids: Secondary | ICD-10-CM | POA: Diagnosis not present

## 2023-11-19 DIAGNOSIS — H0288B Meibomian gland dysfunction left eye, upper and lower eyelids: Secondary | ICD-10-CM | POA: Diagnosis not present

## 2023-11-19 DIAGNOSIS — H0012 Chalazion right lower eyelid: Secondary | ICD-10-CM | POA: Diagnosis not present

## 2023-11-19 DIAGNOSIS — H0102A Squamous blepharitis right eye, upper and lower eyelids: Secondary | ICD-10-CM | POA: Diagnosis not present

## 2023-11-19 DIAGNOSIS — H0102B Squamous blepharitis left eye, upper and lower eyelids: Secondary | ICD-10-CM | POA: Diagnosis not present

## 2023-11-21 ENCOUNTER — Ambulatory Visit (HOSPITAL_COMMUNITY)
Admission: RE | Admit: 2023-11-21 | Discharge: 2023-11-21 | Disposition: A | Discharge status: Home / Self Care | Source: Ambulatory Visit | Attending: Internal Medicine | Admitting: Internal Medicine

## 2023-11-21 VITALS — BP 198/88 | HR 67 | Wt 161.8 lb

## 2023-11-21 DIAGNOSIS — I110 Hypertensive heart disease with heart failure: Secondary | ICD-10-CM | POA: Diagnosis not present

## 2023-11-21 DIAGNOSIS — J449 Chronic obstructive pulmonary disease, unspecified: Secondary | ICD-10-CM | POA: Insufficient documentation

## 2023-11-21 DIAGNOSIS — Z7982 Long term (current) use of aspirin: Secondary | ICD-10-CM | POA: Insufficient documentation

## 2023-11-21 DIAGNOSIS — I50810 Right heart failure, unspecified: Secondary | ICD-10-CM

## 2023-11-21 DIAGNOSIS — Z87891 Personal history of nicotine dependence: Secondary | ICD-10-CM | POA: Insufficient documentation

## 2023-11-21 DIAGNOSIS — I2510 Atherosclerotic heart disease of native coronary artery without angina pectoris: Secondary | ICD-10-CM

## 2023-11-21 DIAGNOSIS — I5022 Chronic systolic (congestive) heart failure: Secondary | ICD-10-CM | POA: Diagnosis not present

## 2023-11-21 DIAGNOSIS — I10 Essential (primary) hypertension: Secondary | ICD-10-CM | POA: Diagnosis not present

## 2023-11-21 DIAGNOSIS — E669 Obesity, unspecified: Secondary | ICD-10-CM | POA: Insufficient documentation

## 2023-11-21 DIAGNOSIS — I252 Old myocardial infarction: Secondary | ICD-10-CM | POA: Diagnosis not present

## 2023-11-21 DIAGNOSIS — Z853 Personal history of malignant neoplasm of breast: Secondary | ICD-10-CM | POA: Diagnosis not present

## 2023-11-21 DIAGNOSIS — E875 Hyperkalemia: Secondary | ICD-10-CM | POA: Insufficient documentation

## 2023-11-21 DIAGNOSIS — Z955 Presence of coronary angioplasty implant and graft: Secondary | ICD-10-CM | POA: Insufficient documentation

## 2023-11-21 DIAGNOSIS — J9611 Chronic respiratory failure with hypoxia: Secondary | ICD-10-CM | POA: Insufficient documentation

## 2023-11-21 NOTE — Patient Instructions (Signed)
 Medication Changes:  None, continue current medications  Special Instructions // Education:  Do the following things EVERYDAY: Weigh yourself in the morning before breakfast. Write it down and keep it in a log. Take your medicines as prescribed Eat low salt foods--Limit salt (sodium) to 2000 mg per day.  Stay as active as you can everyday Limit all fluids for the day to less than 2 liters   Follow-Up in: 1 year (October 2026), **PLEASE CALL OUR OFFICE IN AUGUST TO SCHEDULE THIS APPOINTMENT   At the [facility], you and your health needs are our priority. We have a designated team specialized in the treatment of Heart Failure. This Care Team includes your primary Heart Failure Specialized Cardiologist (physician), Advanced Practice Providers (APPs- Physician Assistants and Nurse Practitioners), and Pharmacist who all work together to provide you with the care you need, when you need it.   You may see any of the following providers on your designated Care Team at your next follow up:  Dr. Toribio Fuel Dr. Ezra Shuck Dr. Ria Commander Dr. Odis Brownie Greig Mosses, NP Caffie Shed, [place] [facility] [place], [place] Beckey Coe, NP [place] Lee, NP Tinnie Redman, PharmD   Please be sure to bring in all your medications bottles to every appointment.   Need to Contact Us :  If you have any questions or concerns before your next appointment please send us  a message through [facility] or call our office at [phone].    TO LEAVE A MESSAGE FOR THE NURSE SELECT OPTION 2, PLEASE LEAVE A MESSAGE INCLUDING: YOUR NAME DATE OF BIRTH CALL BACK NUMBER REASON FOR CALL**this is important as we prioritize the call backs  YOU WILL RECEIVE A CALL BACK THE SAME DAY AS LONG AS YOU CALL BEFORE 4:00 PM

## 2023-11-21 NOTE — Addendum Note (Signed)
 Encounter addended by: Buell Powell HERO, RN on: 11/21/2023 2:56 PM  Actions taken: Clinical Note Signed

## 2023-11-21 NOTE — Progress Notes (Signed)
 HF: Dr Cherrie Pulmonary : Dr Darlean PCP: Dr Richardson Lewis   ADVANCED HF CLINIC NOTE  HPI: Deanna Johnston) is a 78 y/o female with PMH of COPD with tobacco use, CAD s/p NSTEMI with RCA stents in 2011 and obesity.   Admitted to [facility] 6/18 with PNA and severe cor pulmonale. Echo 07/21/16: LV EF 65-70% with dilated RV and moderate RV HK. Started on dopamine  for RV support. CT chest no PE. Diuresed well and renal function improved. Cath with mild PAH after optimization.   Echo 8/20 EF 60-65% RV normal.  Echo 10/26/20 EF 65-70% RV normal.   Diagnosed 1/23 with left breast CA stage 1A. ER+, PR+, HER2: Equivocal. Underwent left lumpectomy in 2/23. Refused XRT. Tried anastrazole but stopped due to SOB.   Here for f/u. Has had a hard year taking care of her sister. Says she is exhausted but now feeling a lot better. Taking BP every day at home SBP 140/60-70. No CP, edema, orthopnea or PND. No problem with meds  Not smoking x 5 years   Testing   PFTS 8/18 FEV1  1.2 (74%) FVC   1.9 87%)  FEF 25-75 45% DLCO 58% Moderate obstruction with moderate to severe diffusion defect.   Echo 07/21/16: LV EF 65-70% with dilated RV and moderate RV HK. Started on dopamine  for RV support. CT chest no PE. Diuresed well and renal function improved.  11/2016  LV function remains normal. RV now improved with normal size and function.  Underwent RHC/LHC on 07/26/16  Mid RCA to Dist RCA lesion, 0 %stenosed. Mid RCA lesion, 20 %stenosed. Dist RCA lesion, 30 %stenosed. Dist Cx lesion, 60 %stenosed. Prox LAD to Mid LAD lesion, 20 %stenosed. The left ventricular ejection fraction is greater than 65% by visual estimate.  Findings:  Done on dopamine  3mcg/kg/min  Ao = 110/47 (72) LV = 120/10 RA = 5 RV = 47/7 PA = 42/16 (27) PCW = 6 Fick cardiac output/index = 5.7/3.4 PVR = 4.2 WU SVR 945 Ao sat =  88% PA sat = 71%, 69% High SVC sat = 69%  Assessment: 1. Heavily calcified coronary arteries with only  minimal luminal CAD. Widely patent RCA stent 2. LVEF 70% without regional wall motion abnormalities 3. Mild PAH with normal cardiac output on low-dose dopamine  4. No intracardiac shunt 4. PAD - central aortic pressure ~80mmHG greater than radial artery pressure or cuff pressure   cMRI done to evaluate RV dysfunction - LVEF 77% No RV infarct. Possible infiltrative process but not classic amyloid process. No RV scar  SPEP UPEP negative. (cMRI reviewed with Dr. Maranda and not felt to have infiltrative process)  SH:  Social History   Socioeconomic History   Marital status: Single    Spouse name: Not on file   Number of children: Not on file   Years of education: Not on file   Highest education level: Not on file  Occupational History   Occupation: Retired    Associate Professor: RETIRED  Tobacco Use   Smoking status: Former    Current packs/day: 0.00    Average packs/day: 0.5 packs/day for 47.0 years (23.5 ttl pk-yrs)    Types: Cigarettes    Start date: 07/20/1969    Quit date: 07/20/2016    Years since quitting: 7.3   Smokeless tobacco: Never  Vaping Use   Vaping status: Never Used  Substance and Sexual Activity   Alcohol use: Not Currently   Drug use: No   Sexual activity: Not  Currently    Birth control/protection: Surgical    Comment: HYST-1st intercourse 78 yo-More than 5 partners  Other Topics Concern   Not on file  Social History Narrative   Not on file   Social Drivers of Health   Financial Resource Strain: Not on file  Food Insecurity: Not on file  Transportation Needs: Not on file  Physical Activity: Not on file  Stress: Not on file  Social Connections: Not on file  Intimate Partner Violence: Not on file    FH:  Family History  Problem Relation Age of Onset   Heart failure Mother    Stomach cancer Mother 5   Heart disease Father    Diabetes Father    Colon cancer Sister 24   Melanoma Sister    Heart disease Brother    Cancer Maternal Grandmother        unknown  type    Past Medical History:  Diagnosis Date   Cancer (HCC)    Breast Cancer   Cataract    Cervical dysplasia    COPD (chronic obstructive pulmonary disease) (HCC)    Coronary artery disease    Dyspnea    Hyperlipidemia    Hypertension    MI (myocardial infarction) (HCC) 01/2010   stent mid right coronary   Obesity    Tobacco dependence    VAIN (vaginal intraepithelial neoplasia) 2011   Efudex treatment    Current Outpatient Medications  Medication Sig Dispense Refill   albuterol  (PROAIR  HFA) 108 (90 Base) MCG/ACT inhaler 2 puffs every 4 hours as needed only  if your can't catch your breath 1 Inhaler 1   aspirin  EC 81 MG tablet Take 81 mg by mouth daily.     furosemide  (LASIX ) 40 MG tablet Take 1 tablet by mouth once daily 90 tablet 3   Ginger 500 MG CAPS Take 500 mg by mouth daily.     Honey 5 GM/5ML SYRP Take 5 mLs by mouth daily.     nitroGLYCERIN  (NITROSTAT ) 0.4 MG SL tablet DISSOLVE ONE TABLET UNDER THE TONGUE EVERY 5 MINUTES AS NEEDED FOR CHEST PAIN 25 tablet 0   NP THYROID  60 MG tablet Take 60 mg by mouth daily.     nystatin  cream (MYCOSTATIN ) APPLY 1 APPLICATION TOPICALLY TWICE DAILY 30 g 0   OXYGEN  Inhale 2 L into the lungs at bedtime.     potassium chloride  SA (KLOR-CON  M20) 20 MEQ tablet Take 1 tablet by mouth once daily 90 tablet 3   Ascorbic Acid (VITAMIN C) 100 MG tablet Take 100 mg by mouth daily. (Patient not taking: Reported on 11/21/2023)     b complex vitamins tablet Take 1 tablet by mouth daily. (Patient not taking: Reported on 11/21/2023)     No current facility-administered medications for this encounter.    Vitals:   11/21/23 1350  BP: (!) 198/88  Pulse: 67  SpO2: 94%  Weight: 73.4 kg (161 lb 12.8 oz)     Wt Readings from Last 3 Encounters:  11/21/23 73.4 kg (161 lb 12.8 oz)  11/13/22 72.8 kg (160 lb 9.6 oz)  11/02/21 72.8 kg (160 lb 6.4 oz)    PHYSICAL EXAM: General:  Elderly  No resp difficulty HEENT: normal Neck: supple. no JVD.  Carotids 2+ bilat; no bruits. No lymphadenopathy or thryomegaly appreciated. Cor: PMI nondisplaced. Regular rate & rhythm. No rubs, gallops or murmurs. Lungs: clear/ decreased Abdomen: obese soft, nontender, nondistended. No hepatosplenomegaly. No bruits or masses. Good bowel sounds. Extremities: no cyanosis,  clubbing, rash, edema Neuro: alert & orientedx3, cranial nerves grossly intact. moves all 4 extremities w/o difficulty. Affect pleasant   Hall walk 10/2022: O2 sat 93% at rest 87% with hallwalk.   ECG: NSR 69 No ST-T wave abnormalities. Personally reviewed    ASSESSMENT & PLAN:  1. PAH/RV failure - Echo 11/2016 EF 55-60%, grade 1 DD, RV normal - Echo 8/20 EF 60-65% RV normal. - Echo 10/26/20 EF 65-70% RV normal.  - Echo 10/24 EF EF 60-65% RV normal. No sig TR - Suspect due to COPD and untreated OSA - No evidence of RV infarct or infiltrative process on cMRI 6/18 - Mild PAH on cath 6/18 - Stable NYHA II-III - Volume ok - Off spiro due to hyperkalemia. - Encouraged her to wear O2 with exertion and track sats - Refuses CPAP      2. CAD - s/p inferior MI 2011 with RCA stents - cath with non-obstructive CAD in 6/18 (LAD 20%, dLCX 60%, RCA 30%) - No s/s ischemia - Continue ASA. Unable to tolerate statins or zetia . Goal LDL < 70 - I sent her to Lipid Clinic and she refused PCSK-9i   3. Tobacco use and chronic hypoxic respiratory failure - Quit smoking in July 19, 2016.  - Continue O2. Follows with Dr Darlean who recently released her to PRN f/u - Has remained quit  4. HTN - Has severe white coat syndrome - BP elevated here but well-controlled at home  6. L breast CA - s/p L lupectomy    Toribio Fuel, MD  2:45 PM

## 2023-11-29 DIAGNOSIS — H6122 Impacted cerumen, left ear: Secondary | ICD-10-CM | POA: Diagnosis not present

## 2023-11-29 DIAGNOSIS — E039 Hypothyroidism, unspecified: Secondary | ICD-10-CM | POA: Diagnosis not present

## 2023-11-29 DIAGNOSIS — I509 Heart failure, unspecified: Secondary | ICD-10-CM | POA: Diagnosis not present

## 2023-11-29 DIAGNOSIS — I2721 Secondary pulmonary arterial hypertension: Secondary | ICD-10-CM | POA: Diagnosis not present

## 2023-11-29 DIAGNOSIS — E7800 Pure hypercholesterolemia, unspecified: Secondary | ICD-10-CM | POA: Diagnosis not present

## 2023-11-29 DIAGNOSIS — J9611 Chronic respiratory failure with hypoxia: Secondary | ICD-10-CM | POA: Diagnosis not present

## 2023-11-29 DIAGNOSIS — I2510 Atherosclerotic heart disease of native coronary artery without angina pectoris: Secondary | ICD-10-CM | POA: Diagnosis not present

## 2023-11-29 DIAGNOSIS — J449 Chronic obstructive pulmonary disease, unspecified: Secondary | ICD-10-CM | POA: Diagnosis not present

## 2023-11-29 DIAGNOSIS — C50919 Malignant neoplasm of unspecified site of unspecified female breast: Secondary | ICD-10-CM | POA: Diagnosis not present

## 2023-11-29 DIAGNOSIS — E559 Vitamin D deficiency, unspecified: Secondary | ICD-10-CM | POA: Diagnosis not present

## 2023-11-29 DIAGNOSIS — I119 Hypertensive heart disease without heart failure: Secondary | ICD-10-CM | POA: Diagnosis not present

## 2023-12-06 ENCOUNTER — Encounter (INDEPENDENT_AMBULATORY_CARE_PROVIDER_SITE_OTHER): Payer: Self-pay

## 2023-12-11 ENCOUNTER — Ambulatory Visit: Admitting: Internal Medicine

## 2023-12-11 ENCOUNTER — Encounter: Payer: Self-pay | Admitting: Internal Medicine

## 2023-12-11 VITALS — BP 167/76 | HR 72 | Temp 98.1°F | Ht 61.0 in | Wt 160.0 lb

## 2023-12-11 DIAGNOSIS — J9611 Chronic respiratory failure with hypoxia: Secondary | ICD-10-CM | POA: Diagnosis not present

## 2023-12-11 DIAGNOSIS — J449 Chronic obstructive pulmonary disease, unspecified: Secondary | ICD-10-CM | POA: Diagnosis not present

## 2023-12-11 NOTE — Progress Notes (Signed)
 Subjective:   Patient ID: Deanna Johnston, female    DOB: 08-21-1945   MRN: [id number]    Brief patient profile:  66  yowf quit smoking 07/2016  with tendency to rhinitis/cough x adulthood better since around 2000 on one tsp honey per day then cough started early Dec 2014 and persisted so referred 03/11/2013 by FORBES Lewis proved to have probable acei cough and unrelated  COPD GOLD II documented 04/14/2013     History of Present Illness    03/11/2013 1st [facility] office visit/ Arina Torry on ACEi cc persistent cough x 8 weeks she attributes to getting the pneumonia booster more day > night mostly  non-productive  And assoc with sense of pnds / throat tickle.  Does not wake her from sleep and not aware of it until after stirs in am. Already tried abx and inhalers not helping rec The key is to stop smoking completely before smoking completely stops you - this is the most important aspect of your care Stop lisinopril  one daily  Start diovan  80 mg one daily      04/14/2013 f/u ov/Deanna Johnston re: GOLD II COPD/ still smoking Cc cough now mostly at hs now whereas was more all day before/ somewhat rattling features, but no excess or purulent sputum production. Not limited by breathing from desired activities  - no need for saba   rec You have GOLD II COPD, the only way to keep it from progressing is to stop smoking now. If the cough starts bothering you again: Pantoprazole  (protonix ) 40 mg   Take 30-60 min before first meal of the day and Pepcid  20 mg one bedtime until return to office - this is the best way to tell whether stomach acid is contributing to your problem.   GERD diet Please schedule a follow up office visit in 4 weeks, sooner if needed > did not return as rec    Admit date: 07/20/2016 Discharge date: 07/29/2016   Discharge Diagnoses:  Principal Problem:   Acute respiratory failure with hypoxia and hypercapnia (HCC) Active Problems:   Acute on chronic heart failure (HCC)   Cardiogenic  shock (HCC)   Tobacco dependence   Hyperlipidemia   Essential hypertension, benign   Pressure injury of skin   Hypotension   Right ventricular failure (HCC)   PAH (pulmonary artery hypertension) (HCC)   Hypoxia     History of present illness:  Per Dr. Everitt Lowes 78 year old female with PMH of COPD (Active smoker, 40-50 year 2 ppd, reports cut back to 1/2 ppd), CAD s/p stents in 2011, HLD, MI. Reports at baseline is SOB with chronic cough.    Presents to ED on 6/7 with progressive dyspnea for the last 2 weeks with increased swelling to BLE. Upon arrival to ED BNP 1113.9, WBC 11.6, Lactic 1.45, ABG 7.3/60/63. Hypotensive with systolic 60-90, oxygenation 90s on 4L [place], received 500 ml bolus and Rocephin /Azithromycin . Cardiology was consulted who recommended diuresis. PCCM asked to admit.    Hospital Course:  #1 acute on chronic hypoxic/hypercarbic respiratory failure Concern for possible acute exacerbation of COPD and acute on chronic right ventricular heart failure. Patient was on the critical care service initially required BiPAP and subsequently weaned down to high flow nasal 6 L. Patient with clinical improvement. It was felt by critical care medicine the patient was likely hypoxemic for years and will likely need home O2 upon discharge. BiPAP was subsequently discontinued. Patient will was also diuresed with IV diuretics and transitioned  to oral Lasix  per cardiology. Patient was -10.8 L during this hospitalization. Patient status post antibiotics. Patient also placed on DuoNeb's per pulmonary recommendations and will be discharged on DuoNeb's. Patient improved clinically. Oxygen  on room air and ambulation with checked and patient was noted to have sats of 85% on room air. Patient be discharged home on oxygen  3 L nasal cannula. Patient is to follow-up with pulmonary and cardiology in the outpatient setting.   #2 shock secondary to right ventricular failure likely secondary to cor pulmonale and  obstructive sleep apnea 2-D echo 07/21/2016 with a EF of 65-70% with grade 1 diastolic dysfunction, moderate to severely reduced right ventricular systolic function. Dopamine  which was initially started due to shock was subsequently discontinued as patient's blood pressure improved. Patient status post right and left heart catheterization 07/26/2016 with EF of 65%, 60% distal circumflex lesion, 20% proximal LAD to mid LAD lesion, 30% distal RCA lesion, 20% stenosed mid RCA lesion. Patient subsequently underwent cardiac MRI. Is felt per pulmonary and cardiology that patient would likely need home O2 and outpatient sleep study. Patient's blood pressure stabilized after dopamine  was discontinued. Patient's antihypertensive medications were held due to borderline blood pressure was not resumed on discharge. Patient was discharged on oral Lasix  40 mg daily and is to follow-up with cardiology and pulmonary outpatient setting. Patient will be set up by pulmonary for sleep study.    #3 hyperlipidemia Patient was placed on a statin during the hospitalization be discharged on a statin.   #4 probable obstructive sleep apnea Patient will need outpatient sleep study. Pulmonary to set up as an outpatient.   #6 acute kidney injury Resolved.   #7 coronary artery disease Stable. Cardiac medications of metoprolol  and ARB were held due to borderline blood pressure and not resumed on discharge. Patient will follow-up with cardiology in the outpatient setting.       NP ov  08/17/16  Begin Anoro 1 puff daily . Rinse after use> stopped after one week   Great job not smoking . Keep up good work . SABRA  Decrease Oxygen  2l/m .  Order for POC oxygen  device.    09/25/2016  Extended f/u ov/Deanna Johnston re: re-establish for copd management after failing to f/u in 04/2013 as above  Chief Complaint  Patient presents with   Follow-up    review pft.  pt states she is doing well, denies any current breathing complaints.    thoroughly  confused between maint and prn, not clear did better on anoro as never changed saba to prn  Doe = MMRC2 = can't walk a nl pace on a flat grade s sob but does fine slow and flat eg [employer] shopping on 2lpm does not titrate (has meter, doesn't use with activity) rec When you walk keep saturations above 90%  - same goal when sitting. Plan A = Automatic =  Anoro one click each am  - 2 good drags  Plan B = Backup Only use your albuterol  as a rescue medication  Please see patient coordinator before you leave today  to schedule ambulatory 02 concentrator eligbility        07/09/2018  f/u ov/Deanna Johnston re: copd gold II on prn 02 daytime and maint on anoro daily  Chief Complaint  Patient presents with   Follow-up    Breathing is unchanged. She has only been using o2 with sleep. She has not had to use her albuterol  inhaler.    Dyspnea:  MMRC2 = can't walk a nl pace  on a flat grade s sob but does fine slow and flat  - pushing s 02 or 02 monitor Cough: none Sleeping: on side one pillow flat bed  SABA use: rare 02: 2lpm hs and prn daytime (never but never checks)  Exac: none since last ov  Rec No change in your medications  Work on proair  inhaler technique:   Continue anoro one click each am  Only use your albuterol  (proair )  as a rescue medication   03/01/2021  f/u ov/Deanna Johnston re: COPD GOLD 2 /    maint on 02 hs and prn daytime - not using saba or anoro as rec doesn't make a difference Chief Complaint  Patient presents with   Follow-up    Breathing is overall doing well. She states using o2 2lpm with sleep.   Dyspnea:  chair ex / less since covid  Cough: none  Sleeping: flat bed one pillow  SABA use: none /now  02: 2 hs and prn daytime  Rec 02 2lpm bedtime Make sure you check your oxygen  saturation  AT  your highest level of activity (not after you stop)   to be sure it stays over 90%   Only use your albuterol  as a rescue medication  Ok  try albuterol  15 min before an activity (on alternating  days like starter fluid)  that you know would usually make you short of breath   12/11/2023  f/u ov/Deanna Johnston re: COPD GOLD 2/02@ 2lpm hs   Chief Complaint  Patient presents with   COPD    Patient states her breathing is fine. Patient needs a new prescription for home concentrator.  Dyspnea:  shops at [employer] s checking sats  on RA / walks with rollator or shopping cart Cough: none  Sleeping: flat bed one pillow s  resp cc  SABA use: none  02: 2lpm    No obvious day to day or daytime variability or assoc excess/ purulent sputum or mucus plugs or hemoptysis or cp or chest tightness, subjective wheeze or overt sinus or hb symptoms.    Also denies any obvious fluctuation of symptoms with weather or environmental changes or other aggravating or alleviating factors except as outlined above   No unusual exposure hx or h/o childhood pna/ asthma or knowledge of premature birth.  Current Allergies, Complete Past Medical History, Past Surgical History, Family History, and Social History were reviewed in [employer] record.  ROS  The following are not active complaints unless bolded Hoarseness, sore throat, dysphagia, dental problems, itching, sneezing,  nasal congestion or discharge of excess mucus or purulent secretions, ear ache,   fever, chills, sweats, unintended wt loss or wt gain, classically pleuritic or exertional cp,  orthopnea pnd or arm/hand swelling  or leg swelling, presyncope, palpitations, abdominal pain, anorexia, nausea, vomiting, diarrhea  or change in bowel habits or change in bladder habits, change in stools or change in urine, dysuria, hematuria,  rash, arthralgias, visual complaints, headache, numbness, weakness or ataxia or problems with walking or coordination,  change in mood or  memory.        Current Meds  Medication Sig   albuterol  (PROAIR  HFA) 108 (90 Base) MCG/ACT inhaler 2 puffs every 4 hours as needed only  if your can't catch your breath (Patient  taking differently: 2 puffs every 4 hours as needed only  if your can't catch your breath.prn)   aspirin  EC 81 MG tablet Take 81 mg by mouth daily.   furosemide  (LASIX ) 40 MG  tablet Take 1 tablet by mouth once daily   Ginger 500 MG CAPS Take 500 mg by mouth daily.   Honey 5 GM/5ML SYRP Take 5 mLs by mouth daily.   nitroGLYCERIN  (NITROSTAT ) 0.4 MG SL tablet DISSOLVE ONE TABLET UNDER THE TONGUE EVERY 5 MINUTES AS NEEDED FOR CHEST PAIN (Patient taking differently: prn)   NP THYROID  60 MG tablet Take 60 mg by mouth daily. (Patient taking differently: Take 60 mg by mouth daily. Taking 90 mg)   nystatin  cream (MYCOSTATIN ) APPLY 1 APPLICATION TOPICALLY TWICE DAILY   OXYGEN  Inhale 2 L into the lungs at bedtime.   potassium chloride  SA (KLOR-CON  M20) 20 MEQ tablet Take 1 tablet by mouth once daily             Objective:   Physical Exam  Wts  12/11/2023 160  03/01/2021   159  07/09/2018   158 04/14/2013     153 > 09/25/2016   155 > 12/26/2016     160 >  06/25/2017  160  >      03/11/13 153 lb 12.8 oz (69.763 kg)  09/04/12 159 lb (72.122 kg)  08/02/12 158 lb (71.668 kg)   Vital signs reviewed  12/11/2023  - Note at rest 02 sats  94% on RA   General appearance:    amb perky elderly wf nad walks with rollator     HEENT : Oropharynx  clear   Nasal turbinates nl    NECK :  without  apparent JVD/ palpable Nodes/TM    LUNGS: no acc muscle use,  Mild barrel  contour chest wall with bilateral  Distant bs s audible wheeze and  without cough on insp or exp maneuvers  and mild  Hyperresonant  to  percussion bilaterally     CV:  RRR  no s3 or murmur or increase in P2, and  tracee  pitting despite elastic hose both LEs  ABD:  soft and nontender   MS:  Nl gait/ ext warm without deformities Or obvious joint restrictions  calf tenderness, cyanosis or clubbing     SKIN: warm and dry without lesions    NEURO:  alert, approp, nl sensorium with  no motor or cerebellar deficits apparent.           Assessment & Plan:   Assessment & Plan COPD GOLD II  Quit smoking 07/2016  - PFTs 04/14/2013  FEV1  1.03 (59%) ratio 69 and no better with dlco 66% - PFT's  09/25/2016  FEV1 1.16  (71 % ) ratio 64  p no % improvement from saba p neb w/in 4 h  prior to study with DLCO  58/52  % corrects to 62 % for alv volume   - 09/25/2016  After extensive coaching  Device  effectiveness =    90% vs 0 % baseline (blew out first) on elipta device so rechallenge with anoro each am  - 06/25/2017  After extensive coaching inhaler device  effectiveness =    75% (TI short)  >>> declined further bronchodilators 12/11/2023   Feels better off inhalers and tolerating walking off 02 as well but still at risk of significant  ex and noct 02 desat and either way needs to learn better pacing if going to continue to decline trial of broncholilators anad doesn't qualify for 02 which was the case today ? Benefit from pulmonary rehab?    Chronic respiratory failure with hypoxia (HCC) HC03  09/21/16  =  29 (improved from peak  of 36 one m prior)  -  09/25/2016   Room  Air at Rest = 88% Patient Saturations on Room Air while Ambulating = 87% Patient Saturations on 2L Liters of oxygen  while Ambulating = 93% - 12/11/2023 no desats walking   >> > recheck 0NO on RA ordered    Each maintenance medication was reviewed in detail including emphasizing most importantly the difference between maintenance and prns and under what circumstances the prns are to be triggered using an action plan format where appropriate.  Total time for H and P, chart review, counseling,  directly observing portions of ambulatory 02 saturation study/ and generating customized AVS unique to this office visit / same day charting = 35 min                  AVS  Patient Instructions  My office will be contacting you by phone for referral to overnight 02 on Room air   - if you don't hear back from my office within one week please call us  back or notify us  thru  MyChart and we'll address it right away. O  Pulmonary follow up is as needed for 02 or breathing issues      Ozell America, MD 12/11/2023

## 2023-12-11 NOTE — Assessment & Plan Note (Addendum)
 Quit smoking 07/2016  - PFTs 04/14/2013  FEV1  1.03 (59%) ratio 69 and no better with dlco 66% - PFT's  09/25/2016  FEV1 1.16  (71 % ) ratio 64  p no % improvement from saba p neb w/in 4 h  prior to study with DLCO  58/52  % corrects to 62 % for alv volume   - 09/25/2016  After extensive coaching  Device  effectiveness =    90% vs 0 % baseline (blew out first) on elipta device so rechallenge with anoro each am  - 06/25/2017  After extensive coaching inhaler device  effectiveness =    75% (TI short)  >>> declined further bronchodilators 12/11/2023   Feels better off inhalers and tolerating walking off 02 as well but still at risk of significant  ex and noct 02 desat and either way needs to learn better pacing if going to continue to decline trial of broncholilators anad doesn't qualify for 02 which was the case today ? Benefit from pulmonary rehab?

## 2023-12-11 NOTE — Patient Instructions (Addendum)
 My office will be contacting you by phone for referral to overnight 02 on Room air   - if you don't hear back from my office within one week please call us  back or notify us  thru MyChart and we'll address it right away. O  Pulmonary follow up is as needed for 02 or breathing issues

## 2023-12-11 NOTE — Assessment & Plan Note (Addendum)
 HC03  09/21/16  =  29 (improved from peak of 36 one m prior)  -  09/25/2016   Room  Air at Rest = 88% Patient Saturations on Room Air while Ambulating = 87% Patient Saturations on 2L Liters of oxygen  while Ambulating = 93% - 12/11/2023 no desats walking   >> > recheck 0NO on RA ordered    Each maintenance medication was reviewed in detail including emphasizing most importantly the difference between maintenance and prns and under what circumstances the prns are to be triggered using an action plan format where appropriate.  Total time for H and P, chart review, counseling,  directly observing portions of ambulatory 02 saturation study/ and generating customized AVS unique to this office visit / same day charting = 35 min

## 2024-01-17 ENCOUNTER — Encounter (INDEPENDENT_AMBULATORY_CARE_PROVIDER_SITE_OTHER): Payer: Self-pay | Admitting: Otolaryngology

## 2024-01-17 ENCOUNTER — Ambulatory Visit (INDEPENDENT_AMBULATORY_CARE_PROVIDER_SITE_OTHER): Admitting: Otolaryngology

## 2024-01-17 VITALS — BP 170/69 | HR 94 | Ht <= 58 in | Wt 155.0 lb

## 2024-01-17 DIAGNOSIS — J3489 Other specified disorders of nose and nasal sinuses: Secondary | ICD-10-CM

## 2024-01-17 DIAGNOSIS — J310 Chronic rhinitis: Secondary | ICD-10-CM

## 2024-01-17 DIAGNOSIS — H6983 Other specified disorders of Eustachian tube, bilateral: Secondary | ICD-10-CM

## 2024-01-17 DIAGNOSIS — J343 Hypertrophy of nasal turbinates: Secondary | ICD-10-CM

## 2024-01-17 DIAGNOSIS — J342 Deviated nasal septum: Secondary | ICD-10-CM | POA: Diagnosis not present

## 2024-01-19 DIAGNOSIS — J310 Chronic rhinitis: Secondary | ICD-10-CM | POA: Insufficient documentation

## 2024-01-19 DIAGNOSIS — H6983 Other specified disorders of Eustachian tube, bilateral: Secondary | ICD-10-CM | POA: Insufficient documentation

## 2024-01-19 DIAGNOSIS — J343 Hypertrophy of nasal turbinates: Secondary | ICD-10-CM | POA: Insufficient documentation

## 2024-01-19 DIAGNOSIS — J342 Deviated nasal septum: Secondary | ICD-10-CM | POA: Insufficient documentation

## 2024-01-19 NOTE — Progress Notes (Signed)
 CC: Chronic nasal obstruction  Discussed the use of AI scribe software for clinical note transcription with the patient, who gave verbal consent to proceed.  History of Present Illness Deanna Johnston is an 77 year old female who presents today complaining of chronic nasal obstruction.  She has been experiencing nasal obstruction and sinus congestion for the past two weeks, characterized by alternating nasal obstruction depending on her position. When lying on one side, the corresponding nostril opens while the other closes, and this pattern reverses with a change in position. She describes a sensation of water running from her nose when bending forward, which she attributes to mucus secretion.   She notes significant improvement in hearing when performing a Valsalva maneuver. No history of ear, nose, or throat surgeries.  She is on oxygen  at night due to heart failure diagnosed in 2019, prescribed because she breathes 'so low' during sleep. She also experiences stress-related symptoms, such as clenching her hands at night, which she associates with her stress levels.  She uses Vicks and herbal nasal spray but often forgets to use Flonase, which she has at home. No frequent trouble breathing through her nose, noting it occurs only occasionally over the past couple of months.   Past Medical History:  Diagnosis Date   [facility])    Breast Cancer   Cataract    Cervical dysplasia    COPD (chronic obstructive pulmonary disease) (HCC)    Coronary artery disease    Dyspnea    Hyperlipidemia    Hypertension    MI (myocardial infarction) (HCC) 01/2010   stent mid right coronary   Obesity    Tobacco dependence    VAIN (vaginal intraepithelial neoplasia) 2011   Efudex treatment    Past Surgical History:  Procedure Laterality Date   ABDOMINAL HYSTERECTOMY     TAH BSO   anal fistula repair     BREAST LUMPECTOMY Left 03/2021   BREAST LUMPECTOMY WITH RADIOACTIVE SEED LOCALIZATION Left  04/06/2021   Procedure: LEFT BREAST LUMPECTOMY WITH RADIOACTIVE SEED X2 LOCALIZATION;  Surgeon: Vernetta Berg, MD;  Location: [facility] OR;  Service: General;  Laterality: Left;   CARDIAC CATHETERIZATION  2011 Dec.   stent mid right coronary,Primus element stent   CATARACT EXTRACTION     COLPOSCOPY     OOPHORECTOMY     BSO   OTHER SURGICAL HISTORY     hysterectomy   RIGHT/LEFT HEART CATH AND CORONARY ANGIOGRAPHY N/A 07/26/2016   Procedure: Right/Left Heart Cath and Coronary Angiography;  Surgeon: Cherrie Toribio SAUNDERS, MD;  Location: [facility] INVASIVE CV LAB;  Service: Cardiovascular;  Laterality: N/A;    Family History  Problem Relation Age of Onset   Heart failure Mother    Stomach cancer Mother 33   Heart disease Father    Diabetes Father    Colon cancer Sister 53   Melanoma Sister    Heart disease Brother    Cancer Maternal Grandmother        unknown type    Social History:  reports that she quit smoking about 7 years ago. Her smoking use included cigarettes. She started smoking about 54 years ago. She has a 23.5 pack-year smoking history. She has never used smokeless tobacco. She reports that she does not currently use alcohol. She reports that she does not use drugs.  Allergies:  Allergies  Allergen Reactions   Lipitor [Atorvastatin]     myalgias   Crestor [Rosuvastatin Calcium]     intolerant   Prednisone  just makes me sick   Sulfa Antibiotics Swelling   Synthroid [Levothyroxine Sodium]     Pt unsure   Niaspan [Niacin] Nausea Only    Prior to Admission medications   Medication Sig Start Date End Date Taking? Authorizing Provider  albuterol  (PROAIR  HFA) 108 (90 Base) MCG/ACT inhaler 2 puffs every 4 hours as needed only  if your can't catch your breath Patient taking differently: 2 puffs every 4 hours as needed only  if your can't catch your breath.prn 12/26/16  Yes Darlean Ozell NOVAK, MD  aspirin  EC 81 MG tablet Take 81 mg by mouth daily.   Yes [provider]   furosemide  (LASIX ) 40 MG tablet Take 1 tablet by mouth once daily 08/30/23  Yes Bensimhon, Toribio SAUNDERS, MD  Ginger 500 MG CAPS Take 500 mg by mouth daily.   Yes [provider]  Honey 5 GM/5ML SYRP Take 5 mLs by mouth daily.   Yes [provider]  nitroGLYCERIN  (NITROSTAT ) 0.4 MG SL tablet DISSOLVE ONE TABLET UNDER THE TONGUE EVERY 5 MINUTES AS NEEDED FOR CHEST PAIN Patient taking differently: prn 09/19/23  Yes Bensimhon, Toribio SAUNDERS, MD  NP THYROID  60 MG tablet Take 60 mg by mouth daily. Patient taking differently: Take 60 mg by mouth daily. Taking 90 mg 10/26/20  Yes [provider]  nystatin  cream (MYCOSTATIN ) APPLY 1 APPLICATION TOPICALLY TWICE DAILY 01/19/22  Yes Lavoie, Marie-Lyne, MD  OXYGEN  Inhale 2 L into the lungs at bedtime.   Yes [provider]  potassium chloride  SA (KLOR-CON  M20) 20 MEQ tablet Take 1 tablet by mouth once daily 02/20/23  Yes Bensimhon, Toribio SAUNDERS, MD    Blood pressure (!) 170/69, pulse 94, height 4' 10 (1.473 m), weight 155 lb (70.3 kg), SpO2 90%. Exam: General: Communicates without difficulty, well nourished, no acute distress. Head: Normocephalic, no evidence injury, no tenderness, facial buttresses intact without stepoff. Face/sinus: No tenderness to palpation and percussion. Facial movement is normal and symmetric. Eyes: PERRL, EOMI. No scleral icterus, conjunctivae clear. Neuro: CN II exam reveals vision grossly intact.  No nystagmus at any point of gaze. Ears: Auricles well formed without lesions.  Ear canals are intact without mass or lesion.  No erythema or edema is appreciated.  The TMs are intact without fluid. Nose: External evaluation reveals normal support and skin without lesions.  Dorsum is intact.  Anterior rhinoscopy reveals congested mucosa over anterior aspect of inferior turbinates and intact septum.  No purulence noted. Oral:  Oral cavity and oropharynx are intact, symmetric, without erythema or edema.  Mucosa is moist without  lesions. Neck: Full range of motion without pain.  There is no significant lymphadenopathy.  No masses palpable.  Thyroid  bed within normal limits to palpation.  Parotid glands and submandibular glands equal bilaterally without mass.  Trachea is midline. Neuro:  CN 2-12 grossly intact.   Procedure:  Flexible Nasal Endoscopy: Description: Risks, benefits, and alternatives of flexible endoscopy were explained to the patient.  Specific mention was made of the risk of throat numbness with difficulty swallowing, possible bleeding from the nose and mouth, and pain from the procedure.  The patient gave oral consent to proceed.  The flexible scope was inserted into the right nasal cavity.  Endoscopy of the interior nasal cavity, superior, inferior, and middle meatus was performed. The sphenoid-ethmoid recess was examined. Edematous mucosa was noted.  No polyp, mass, or lesion was appreciated. Nasal septal deviation noted. Olfactory cleft was clear.  Nasopharynx was clear.  Turbinates  were hypertrophied but without mass.  The procedure was repeated on the contralateral side with similar findings.  The patient tolerated the procedure well.  Assessment & Plan Chronic nasal obstruction and eustachian tube dysfunction Chronic nasal congestion and eustachian tube dysfunction due to nasal mucosal congestion, nasal septal deviation, and bilateral inferior turbinate hypertrophy. Symptoms include alternating nasal obstruction and difficulty hearing due to pressure imbalance across the eardrum. Examination reveals a deviated septum and swollen turbinates, contributing to nasal obstruction.  - Discontinue Vicks nasal spray due to risk of rebound congestion. - Initiate Flonase nasal spray, two sprays in each nostril daily, with proper technique to avoid epistaxis.  The importance of consistent daily use is discussed. -Valsalva exercise multiple times a day. - Educated on the delayed onset of Flonase efficacy, requiring  consistent use for several weeks to notice improvement. - Will re-evaluate in two months to assess response to treatment and consider further options if necessary.   Jamien Casanova W Haydee Jabbour 01/19/2024, 9:16 AM

## 2024-02-08 ENCOUNTER — Encounter: Payer: Self-pay | Admitting: Internal Medicine

## 2024-02-11 ENCOUNTER — Encounter

## 2024-02-12 ENCOUNTER — Encounter

## 2024-02-15 ENCOUNTER — Telehealth: Payer: Self-pay

## 2024-02-15 NOTE — Telephone Encounter (Signed)
 Copied from CRM [id number]. Topic: Clinical - Lab/Test Results >> Feb 12, 2024  2:52 PM Rilla B wrote: Reason for CRM: Patient calling to go over sleep study results (10/28). Please call patient to discuss @ [phone]  [facility] do we have pulse oximetry results?

## 2024-02-15 NOTE — Telephone Encounter (Signed)
 There is currently no info that it's been completed. Deanna Johnston will send an email to intake to get in contact with [employer]. They will get in contact with us  after.

## 2024-02-19 ENCOUNTER — Ambulatory Visit
Admission: RE | Admit: 2024-02-19 | Discharge: 2024-02-19 | Disposition: A | Source: Ambulatory Visit | Attending: Internal Medicine

## 2024-02-19 DIAGNOSIS — Z9889 Other specified postprocedural states: Secondary | ICD-10-CM

## 2024-03-16 ENCOUNTER — Other Ambulatory Visit (HOSPITAL_COMMUNITY): Payer: Self-pay | Admitting: Internal Medicine

## 2024-03-25 ENCOUNTER — Ambulatory Visit (INDEPENDENT_AMBULATORY_CARE_PROVIDER_SITE_OTHER): Admitting: Otolaryngology

## 2024-04-23 ENCOUNTER — Ambulatory Visit (INDEPENDENT_AMBULATORY_CARE_PROVIDER_SITE_OTHER): Admitting: Otolaryngology
